# Patient Record
Sex: Female | Born: 1970 | Race: White | Hispanic: No | Marital: Married | Smoking: Never smoker
Health system: Southern US, Community
[De-identification: ages and names within clinical notes are randomized; demographics above are authoritative.]

## PROBLEM LIST (undated history)

## (undated) DIAGNOSIS — T753XXA Motion sickness, initial encounter: Secondary | ICD-10-CM

## (undated) DIAGNOSIS — M1990 Unspecified osteoarthritis, unspecified site: Secondary | ICD-10-CM

## (undated) DIAGNOSIS — R112 Nausea with vomiting, unspecified: Secondary | ICD-10-CM

## (undated) DIAGNOSIS — Z9889 Other specified postprocedural states: Secondary | ICD-10-CM

## (undated) DIAGNOSIS — I10 Essential (primary) hypertension: Secondary | ICD-10-CM

## (undated) DIAGNOSIS — Z8489 Family history of other specified conditions: Secondary | ICD-10-CM

## (undated) DIAGNOSIS — Z972 Presence of dental prosthetic device (complete) (partial): Secondary | ICD-10-CM

## (undated) DIAGNOSIS — R7303 Prediabetes: Secondary | ICD-10-CM

## (undated) DIAGNOSIS — D649 Anemia, unspecified: Secondary | ICD-10-CM

## (undated) DIAGNOSIS — J309 Allergic rhinitis, unspecified: Secondary | ICD-10-CM

## (undated) DIAGNOSIS — Z6841 Body Mass Index (BMI) 40.0 and over, adult: Secondary | ICD-10-CM

## (undated) DIAGNOSIS — E7800 Pure hypercholesterolemia, unspecified: Secondary | ICD-10-CM

## (undated) DIAGNOSIS — E669 Obesity, unspecified: Secondary | ICD-10-CM

## (undated) HISTORY — DX: Allergic rhinitis, unspecified: J30.9

## (undated) HISTORY — PX: JOINT REPLACEMENT: SHX530

## (undated) HISTORY — DX: Obesity, unspecified: E66.9

## (undated) HISTORY — DX: Pure hypercholesterolemia, unspecified: E78.00

## (undated) HISTORY — DX: Body Mass Index (BMI) 40.0 and over, adult: Z684

---

## 1997-10-17 HISTORY — PX: CHOLECYSTECTOMY: SHX55

## 2006-06-30 ENCOUNTER — Emergency Department: Payer: Self-pay | Admitting: Emergency Medicine

## 2006-10-30 ENCOUNTER — Ambulatory Visit: Payer: Self-pay | Admitting: Family Medicine

## 2009-08-19 ENCOUNTER — Ambulatory Visit: Payer: Self-pay | Admitting: Family Medicine

## 2010-08-31 ENCOUNTER — Ambulatory Visit: Payer: Self-pay | Admitting: Family Medicine

## 2011-09-14 ENCOUNTER — Ambulatory Visit: Payer: Self-pay | Admitting: Family Medicine

## 2012-10-11 ENCOUNTER — Ambulatory Visit: Payer: Self-pay | Admitting: Family Medicine

## 2012-12-10 ENCOUNTER — Ambulatory Visit: Payer: Self-pay | Admitting: General Practice

## 2012-12-10 LAB — CBC
HGB: 14 g/dL (ref 12.0–16.0)
MCH: 28.8 pg (ref 26.0–34.0)
MCV: 86 fL (ref 80–100)
RBC: 4.86 10*6/uL (ref 3.80–5.20)
RDW: 14.6 % — ABNORMAL HIGH (ref 11.5–14.5)
WBC: 10.1 10*3/uL (ref 3.6–11.0)

## 2012-12-10 LAB — BASIC METABOLIC PANEL
Calcium, Total: 8.8 mg/dL (ref 8.5–10.1)
Chloride: 105 mmol/L (ref 98–107)
Creatinine: 0.68 mg/dL (ref 0.60–1.30)
EGFR (Non-African Amer.): >60
Glucose: 86 mg/dL (ref 65–99)
Potassium: 3.9 mmol/L (ref 3.5–5.1)
Sodium: 139 mmol/L (ref 136–145)

## 2012-12-10 LAB — PROTIME-INR: INR: 0.9

## 2012-12-10 LAB — URINALYSIS, COMPLETE
Blood: NEGATIVE
Ketone: NEGATIVE
Nitrite: NEGATIVE
Ph: 7 (ref 4.5–8.0)
Protein: NEGATIVE
WBC UR: 4 /HPF (ref 0–5)

## 2012-12-10 LAB — APTT: Activated PTT: 28.4 secs (ref 23.6–35.9)

## 2012-12-10 LAB — MRSA PCR SCREENING

## 2013-01-02 ENCOUNTER — Inpatient Hospital Stay: Payer: Self-pay | Admitting: General Practice

## 2013-01-02 HISTORY — PX: REPLACEMENT TOTAL KNEE: SUR1224

## 2013-01-03 LAB — BASIC METABOLIC PANEL
Anion Gap: 11 (ref 7–16)
BUN: 6 mg/dL — ABNORMAL LOW (ref 7–18)
Co2: 22 mmol/L (ref 21–32)
Creatinine: 0.74 mg/dL (ref 0.60–1.30)
EGFR (Non-African Amer.): >60
Osmolality: 276 (ref 275–301)
Potassium: 4.2 mmol/L (ref 3.5–5.1)
Sodium: 138 mmol/L (ref 136–145)

## 2013-01-03 LAB — PLATELET COUNT: Platelet: 290 10*3/uL (ref 150–440)

## 2013-01-03 LAB — HEMOGLOBIN: HGB: 12.2 g/dL (ref 12.0–16.0)

## 2013-11-15 ENCOUNTER — Ambulatory Visit: Payer: Self-pay | Admitting: Family Medicine

## 2013-11-22 ENCOUNTER — Ambulatory Visit: Payer: Self-pay | Admitting: Family Medicine

## 2014-11-20 ENCOUNTER — Ambulatory Visit: Payer: Self-pay | Admitting: Family Medicine

## 2014-12-21 DIAGNOSIS — Z96659 Presence of unspecified artificial knee joint: Secondary | ICD-10-CM | POA: Insufficient documentation

## 2015-02-06 NOTE — Op Note (Signed)
PATIENT NAME:  Karen Morrison, SZCZESNY MR#:  [id number] DATE OF BIRTH:  1970-10-31  DATE OF PROCEDURE:  01/02/2013  PREOPERATIVE DIAGNOSES:  1.  Degenerative arthrosis of the left knee.  2.  Morbid obesity (body mass index of 46).   POSTOPERATIVE DIAGNOSES:  1.  Degenerative arthrosis of the left knee.  2.  Morbid obesity (body mass index of 46).   PROCEDURE PERFORMED: Left total knee arthroplasty using computer-assisted navigation.   SURGEON: Laurice Record. Hooten, MD   ASSISTANT:  Rachelle Hora, PA-C  ANESTHESIA: Femoral nerve block and spinal.   ESTIMATED BLOOD LOSS: 2000 mL of crystalloid.   TOURNIQUET TIME: 115 minutes.   DRAINS: Two medium drains to reinfusion system.   SOFT TISSUE RELEASES: Anterior cruciate ligament, posterior cruciate ligament, deep medial collateral ligament, and patellofemoral ligament.   IMPLANTS UTILIZED: DePuy PFC Sigma size 3 posterior stabilized femoral component (cemented), size 2.5 MBT tibial component (cemented), 32 mm 3-peg oval dome patella (cemented), and a 10 mm stabilized rotating platform polyethylene insert.   INDICATIONS FOR SURGERY: The patient is a 44 year old female who has been seen for complaints of progressive left knee pain. X-rays demonstrated significant degenerative changes with relative varus deformity. The patient had not seen any significant improvement despite conservative nonsurgical intervention. Of note, the patient's body mass index preoperatively was 46. She was unable to participate in a significant exercise program due to the progressive knee pain. After discussion of the risks and benefits of surgical intervention, the patient expressed understanding of the risks and benefits and agreed with plans for surgical intervention.   PROCEDURE IN DETAIL: The patient was brought into the operating room and, after adequate femoral nerve block and spinal anesthesia was achieved, a tourniquet was placed on the patient's upper left thigh. The  patient's left knee and leg were cleaned and prepped with alcohol and DuraPrep and draped in the usual sterile fashion. A "timeout" was performed as per usual protocol. The left lower extremity was exsanguinated using an Esmarch, and the tourniquet was inflated to 300 mmHg. An anterior longitudinal incision was made followed by a standard mid vastus approach. A moderate effusion was evacuated. Deep fibers of the medial collateral ligament were elevated in a subperiosteal fashion off the medial flare of the tibia so as to maintain a continuous soft tissue sleeve. The patella was subluxed laterally and the patellofemoral ligament was incised. There was some difficulty with mobilization and positioning of the patella due to the soft tissue envelope.  After adequate mobilization, the knee was inspected and demonstrated significant degenerative changes, in both medial and lateral compartments, with lesser changes noted to the patellofemoral articulation. Prominent osteophytes were debrided using a rongeur. Anterior and posterior cruciate ligaments were excised. Two 4.0 mm Schanz pins were inserted into the femur and into the tibia for attachment of the ray of trackers used for computer-assisted navigation. Hip center was identified using a circumduction technique. Distal landmarks were mapped using the computer. The distal femur and proximal tibia were mapped using the computer. Distal femoral cutting guide was positioned using computer-assisted navigation so as to achieve 5 degree distal valgus cut. Cut was performed and verified using the computer. The distal femur was sized and it was felt that a size 3 femoral component was appropriate. Size 3 cutting guide was positioned and the anterior cut was performed and verified using the computer. This was followed by completion of the posterior and chamfer cuts. Femoral cutting guide for the central box was then positioned and  the central box cut was performed. Attention was  then directed to the proximal tibia. Medial and lateral menisci were excised. The extramedullary tibial cutting guide was positioned using computer-assisted navigation so as to achieve 0 degree varus valgus alignment and 0 degree posterior slope. The cut was performed and verified using the computer. The proximal tibia was sized and it was felt that a size 2.5 tibial tray was appropriate. Tibial and femoral trials were inserted followed by insertion of a 10 mm polyethylene insert. Good medial and lateral soft tissue balancing was appreciated, both in extension and in flexion. Finally, the patella was cut and prepared so as to accommodate a 32 mm 3-peg oval dome patella. Patellar trial was placed and the knee was placed through range of motion with good patellar tracking appreciated. The femoral trial was then removed after inspection of any posterior osteophytes. Central post hole for the tibial component was reamed followed by insertion of a keel punch. Tibial trial was then removed. The cut surfaces of bone were irrigated with copious amounts of normal saline with antibiotic solution using pulsatile lavage and then suctioned dry. Polymethyl methacrylate cement was prepared in the usual fashion using a vacuum mixer. Cement was applied to the cut surface of the proximal tibia as well as along the undersurface of a size 2.5 MBT tibial component. The tibial component was positioned and impacted into place. Excess cement was removed using freer elevators. Cement was applied to the cut surface of the femur as well as along the posterior flanges of a size 3 posterior stabilized femoral component. Femoral component was positioned and impacted into place. Excess cement was removed using freer elevators. A 10 mm polyethylene trial was inserted and the knee was brought into full extension with steady axial compression applied. Finally, cement was applied to the backside of a 32 mm 3-peg oval dome patella and the patellar  component was positioned and patellar clamp applied. Excess cement was removed using freer elevators.   After adequate curing of cement, the tourniquet was deflated after a total tourniquet time of 115 minutes. It should be noted that the cement utilized was premixed with gentamicin. Hemostasis was achieved using electrocautery. The knee was irrigated with copious amounts of normal saline with antibiotic solution using pulsatile lavage and then suctioned dry. The knee was inspected for any residual cement debris. 30 mL of 0.25% Marcaine with epinephrine was injected along the posterior capsule. A 10 mm stabilized rotating platform polyethylene insert was inserted and the knee was placed through a range of motion. Good patellar tracking was appreciated and good mediolateral soft tissue balancing was noted, both in extension and in flexion. Two medium drains were placed in the wound bed and brought out through a separate stab incision to be attached to the reinfusion system. The medial parapatellar portion of the incision was reapproximated using interrupted sutures of #1 Vicryl. The subcutaneous tissue was approximated in layers using first #0 Vicryl followed by #2-0 Vicryl. Skin was closed with skin staples. A sterile dressing was applied.   The patient tolerated the procedure well. She was transported to the recovery room in stable condition.  ____________________________ Laurice Record. Holley Bouche., MD jph:sb D: 01/03/2013 06:36:01 ET T: 01/03/2013 07:18:11 ET JOB#: [id number]  cc: Jeneen Rinks P. Holley Bouche., MD, <Dictator> Laurice Record Holley Bouche MD ELECTRONICALLY SIGNED 01/04/2013 17:58

## 2015-02-06 NOTE — Discharge Summary (Signed)
PATIENT NAME:  Karen Morrison, Karen Morrison MR#:  [id number] DATE OF BIRTH:  [birth year]  DATE OF ADMISSION:  01/02/2013 DATE OF DISCHARGE:  01/05/2013  ADMITTING DIAGNOSES: Left knee degenerative arthrosis, morbid obesity.   DISCHARGE DIAGNOSES: Degenerative arthrosis of left knee and morbid obesity.   PROCEDURE PERFORMED: Left total knee arthroplasty using computer-assisted navigation.   SURGEON: Laurice Record. Hooten, M.D.   ASSISTANT: Rachelle Hora, PA-C.   ANESTHESIA: Femoral nerve block.   ESTIMATED BLOOD LOSS: 2000 mL of crystalloid.   HISTORY: The patient is a 44 year old who presents today for history and physical. She is to undergo a left knee arthroplasty on 01/02/2013. She has had a several year history of progressive left knee pain. Pain is aggravated by weight-bearing activities. She is currently not using any ambulatory aids. She has not seen any significant improvement despite use of etodolac, corticosteroid injections or Synvisc injections. She localizes most the pain along the medial aspect of the knee. She has had significant difficulty with ascending and descending stairs. The left knee pain has progressed to the point that it is significantly interfering with her activities of daily living.   PHYSICAL EXAMINATION:  GENERAL: Well developed, well nourished, obese female in no acute distress. Normal affect. Normal gait, although with mild varus thrust of the right knee.  HEENT: Atraumatic, normocephalic. Pupils are equal, round and reactive to light. Extraocular motions intact. Sclerae clear. Oropharynx is clear with mucosa.  NECK: Supple, nontender ,with good range of motion. No thyromegaly, adenopathy, JVD or carotid bruits.  LUNGS: Clear to auscultation bilaterally.  CARDIOVASCULAR: Regular rate and rhythm. Normal S1, S2. No murmur. No appreciable gallops or rubs. Peripheral pulses are palpable.  ABDOMEN: Soft, nontender, nondistended. Bowel sounds are present.  EXTREMITIES: No lower  extremity edema. Homans test is negative. Good strength, stability and range of motion of the upper extremities. Good range of motion of the hips and ankles. Left knee: The patient's left knee has noted swelling, effusion, erythema. She has crepitus with range of motion. She is tender over the medial joint line and has a varus alignment. She has a negative anterior and Lachman's test. She has a negative McMurray's test. There is good muscle tone on the quadriceps. She has range of motion of 0 to 108 degrees. Peripheral pulses are palpable and she is neurovascularly intact with the left lower extremity.   HOSPITAL COURSE: The patient was admitted to the hospital on 01/02/2013. She had surgery that same day and was brought to the orthopedic floor from the PACU in stable condition. On postoperative day 1, the patient's vitals and labs remained stable. She had excellent progress with physical therapy and on postoperative day 2, the patient continued to progress with physical therapy and was doing well. The patient had bowel movements and was ready for discharge by postoperative day 3 on 01/05/2013. The patient was tolerating p.o. Pain was controlled. She was able to ambulate 350 feet with physical therapy. The patient was ready for discharge home with home PT.   CONDITION AT DISCHARGE: Stable.   DISCHARGE INSTRUCTIONS: She may gradually increase weight-bearing on the affected extremity. She is to elevate the affected foot or leg on 1 or 2 pillows with the foot higher than the knee. Thigh-high TED hose on both legs and remove 1 hour per 8- hour shift and use incentive spirometry every 1 hour while awake and encourage cough and deep breathing. She may resume a regular diet as tolerated. She is to use [facility] unit and  maintain a temperature between 40 and 50 degrees. Do not get the dressing or bandage wet or dirty. Call [place] office if the dressing gets water under it. Leave the dressing on.  Call [facility] if any of the following occur: Bright red-red bleeding from the incision or wound, fever above 101.5 degrees, redness, swelling or drainage at the incision. Call [facility] if you experience any increased leg pain, numbness or weakness in legs, bowel or bladder symptoms. She was referred to home physical therapy. Arranged for continuation of rehab program. She is to call [facility] if a therapist has not contacted her within 48 hours of return home. She is to follow up with [facility] in 2 weeks for staple removal.   DISCHARGE MEDICATIONS: Chondroitin/glucosamine 1 tablet orally 2 times a day, Levonest 28 triphasic oral tablet 1 tablet orally once a day, Tylenol 500 mg oral tablet 1 tablet orally every 4 hours as needed for pain or temperature greater than 100.4, tramadol 50 mg oral tablet 1 tablet orally every 4 hours as needed for pain, Celebrex 200 mg oral capsule 1 cap orally 2 times a day, Nucynta 50 mg oral tablet 1 tablet orally every 4 hours as needed for pain, magnesium hydroxide 8% oral suspension 30 mL orally 2 times a day as needed for constipation, aluminum hydroxide and magnesium 30 orally every 6 hours as needed for indigestion or heartburn, Lovenox 40 mg subcutaneous once a day for 14 days, bisacodyl 10 mg rectal suppository 1 suppository rectally once a day as needed for constipation, docusate/senna 50/8.6 mg oral tablet 1 tablet orally 2 times a day, pantoprazole 40 mg oral delayed-release capsule 1 tablet orally 2 times a day.    ____________________________ Duanne Guess, PA-C tcg:jm D: 01/05/2013 07:54:02 ET T: 01/05/2013 14:03:22 ET JOB#: [id number]  cc: Duanne Guess, PA-C, <Dictator> Duanne Guess PA ELECTRONICALLY SIGNED 01/16/2013 12:32

## 2015-10-07 ENCOUNTER — Other Ambulatory Visit: Payer: Self-pay | Admitting: Family Medicine

## 2015-10-07 DIAGNOSIS — Z1231 Encounter for screening mammogram for malignant neoplasm of breast: Secondary | ICD-10-CM

## 2015-11-12 ENCOUNTER — Ambulatory Visit (INDEPENDENT_AMBULATORY_CARE_PROVIDER_SITE_OTHER): Payer: 59 | Admitting: Family Medicine

## 2015-11-12 ENCOUNTER — Encounter: Payer: Self-pay | Admitting: Family Medicine

## 2015-11-12 VITALS — BP 136/80 | HR 118 | Temp 98.4°F | Resp 17 | Wt 273.2 lb

## 2015-11-12 DIAGNOSIS — M779 Enthesopathy, unspecified: Secondary | ICD-10-CM | POA: Insufficient documentation

## 2015-11-12 DIAGNOSIS — E668 Other obesity: Secondary | ICD-10-CM | POA: Insufficient documentation

## 2015-11-12 DIAGNOSIS — R509 Fever, unspecified: Secondary | ICD-10-CM | POA: Diagnosis not present

## 2015-11-12 DIAGNOSIS — J309 Allergic rhinitis, unspecified: Secondary | ICD-10-CM | POA: Insufficient documentation

## 2015-11-12 DIAGNOSIS — J029 Acute pharyngitis, unspecified: Secondary | ICD-10-CM | POA: Diagnosis not present

## 2015-11-12 DIAGNOSIS — K8020 Calculus of gallbladder without cholecystitis without obstruction: Secondary | ICD-10-CM | POA: Insufficient documentation

## 2015-11-12 DIAGNOSIS — E7800 Pure hypercholesterolemia, unspecified: Secondary | ICD-10-CM | POA: Insufficient documentation

## 2015-11-12 LAB — POC INFLUENZA A&B (BINAX/QUICKVUE)
INFLUENZA A, POC: NEGATIVE
INFLUENZA B, POC: NEGATIVE

## 2015-11-12 LAB — POCT RAPID STREP A (OFFICE): RAPID STREP A SCREEN: NEGATIVE

## 2015-11-12 NOTE — Patient Instructions (Signed)
For your throat salt water gargles and ibuprofen. Expect sinus congestion and cough: Mucinex D, Benadryl or Nyquil at night for postnasal drainage, and Delsym for cough

## 2015-11-12 NOTE — Progress Notes (Signed)
Subjective:     Patient ID: Karen Morrison, female   DOB: Mar 23, 1971, 45 y.o.   MRN: [id number]  HPI  Chief Complaint  Patient presents with  . Sore Throat    Patient comes in office today with complaints of sore throat for the past 24hrs. Patient states that it feels like razor blades in her throat and she has noticed white bumps on both side of her tonsils, two coworkers of hers have already been diagnosed with strep. Patient had low grade fever last night and took otc Tylenol .   Reports fever to 101, body aches and chills. + flu shot.   Review of Systems  Respiratory: Negative for cough.        Objective:   Physical Exam  Constitutional: She appears well-developed and well-nourished. No distress.  Ears: T.M's intact without inflammation Throat: no tonsillar enlargement or exudate, adenoids prominent and appear mildly erythematous Neck: left anterior cervical node Lungs: clear     Assessment:    1. Pharyngitis: early viral prodrome - POCT rapid strep A  2. Fever, unspecified fever cause - POC Influenza A&B    Plan:    Discussed sx treatment.

## 2015-11-23 ENCOUNTER — Ambulatory Visit
Admission: RE | Admit: 2015-11-23 | Discharge: 2015-11-23 | Disposition: A | Discharge status: Home / Self Care | Payer: 59 | Source: Ambulatory Visit | Attending: Family Medicine | Admitting: Family Medicine

## 2015-11-23 DIAGNOSIS — Z1231 Encounter for screening mammogram for malignant neoplasm of breast: Secondary | ICD-10-CM | POA: Diagnosis present

## 2015-12-29 ENCOUNTER — Other Ambulatory Visit: Payer: Self-pay | Admitting: Family Medicine

## 2015-12-29 DIAGNOSIS — Z308 Encounter for other contraceptive management: Secondary | ICD-10-CM

## 2015-12-29 DIAGNOSIS — Z309 Encounter for contraceptive management, unspecified: Secondary | ICD-10-CM | POA: Insufficient documentation

## 2016-01-29 ENCOUNTER — Ambulatory Visit (INDEPENDENT_AMBULATORY_CARE_PROVIDER_SITE_OTHER): Payer: 59 | Admitting: Family Medicine

## 2016-01-29 ENCOUNTER — Encounter: Payer: Self-pay | Admitting: Family Medicine

## 2016-01-29 VITALS — BP 134/88 | HR 92 | Temp 98.4°F | Resp 16 | Ht 65.0 in | Wt 273.0 lb

## 2016-01-29 DIAGNOSIS — E7800 Pure hypercholesterolemia, unspecified: Secondary | ICD-10-CM | POA: Diagnosis not present

## 2016-01-29 DIAGNOSIS — Z308 Encounter for other contraceptive management: Secondary | ICD-10-CM

## 2016-01-29 DIAGNOSIS — Z126 Encounter for screening for malignant neoplasm of bladder: Secondary | ICD-10-CM | POA: Diagnosis not present

## 2016-01-29 DIAGNOSIS — Z1211 Encounter for screening for malignant neoplasm of colon: Secondary | ICD-10-CM | POA: Diagnosis not present

## 2016-01-29 DIAGNOSIS — Z0000 Encounter for general adult medical examination without abnormal findings: Secondary | ICD-10-CM

## 2016-01-29 DIAGNOSIS — Z6841 Body Mass Index (BMI) 40.0 and over, adult: Secondary | ICD-10-CM

## 2016-01-29 DIAGNOSIS — Z124 Encounter for screening for malignant neoplasm of cervix: Secondary | ICD-10-CM | POA: Diagnosis not present

## 2016-01-29 DIAGNOSIS — Z96652 Presence of left artificial knee joint: Secondary | ICD-10-CM | POA: Diagnosis not present

## 2016-01-29 LAB — POCT URINALYSIS DIPSTICK
Blood, UA: NEGATIVE
GLUCOSE UA: NEGATIVE
Ketones, UA: NEGATIVE
NITRITE UA: NEGATIVE
Protein, UA: NEGATIVE
SPEC GRAV UA: 1.02
UROBILINOGEN UA: 0.2
pH, UA: 6.5

## 2016-01-29 LAB — IFOBT (OCCULT BLOOD): IFOBT: NEGATIVE

## 2016-01-29 MED ORDER — ETODOLAC 500 MG PO TABS: 500.0000 mg | ORAL_TABLET | Freq: Two times a day (BID) | ORAL | Status: DC

## 2016-01-29 MED ORDER — LEVONORG-ETH ESTRAD TRIPHASIC PO TABS: ORAL_TABLET | ORAL | Status: DC

## 2016-01-29 NOTE — Progress Notes (Signed)
Patient: Karen Morrison, Female    DOB: 02/11/1971, 45 y.o.   MRN: [id number] Visit Date: 01/29/2016  Today's Provider: Margarita Rana, MD   Chief Complaint  Patient presents with  . Annual Exam   Subjective:    Annual physical exam Karen Morrison is a 45 y.o. female who presents today for health maintenance and complete physical. She feels well. She reports exercising 5 days a week. Walks for 30 minutes. She reports she is sleeping well.  Last Pap- 09/05/2012- WNL. HPV neg. Last Mammo-11/23/2015- BI-RADS 1 Tdap- 01/23/2015  -----------------------------------------------------------------   Review of Systems  Constitutional: Positive for diaphoresis (hot flashes).  All other systems reviewed and are negative.   Social History      She  reports that she has never smoked. She has never used smokeless tobacco. She reports that she does not drink alcohol or use illicit drugs.       Social History   Social History  . Marital Status: Married    Spouse Name: Edd Arbour  . Number of Children: 2  . Years of Education: HS   Social History Main Topics  . Smoking status: Never Smoker   . Smokeless tobacco: Never Used  . Alcohol Use: No  . Drug Use: No  . Sexual Activity: Not Asked   Other Topics Concern  . None   Social History Narrative    Past Medical History  Diagnosis Date  . Hypercholesteremia   . Obesity   . Allergic rhinitis      Patient Active Problem List   Diagnosis Date Noted  . Encounter for birth control 12/29/2015  . Allergic rhinitis 11/12/2015  . Calculus of gallbladder 11/12/2015  . Hypercholesteremia 11/12/2015  . Bone spur 11/12/2015  . Extreme obesity ([place]) 11/12/2015  . H/O total knee replacement 12/21/2014    Past Surgical History  Procedure Laterality Date  . Replacement total knee  01/02/2013  . Cesarean section  1994  . Cholecystectomy  1999    Family History        Family Status  Relation Status Death Age  . Mother  Alive   . Paternal Advertising account executive   . Maternal Grandmother Alive   . Maternal Grandfather Deceased   . Paternal Grandfather Deceased   . Paternal Grandmother Deceased   . Father Alive         Her family history includes Alzheimer's disease in her maternal grandmother; Breast cancer in her paternal aunt; Cancer in her paternal aunt; Diabetes in her mother; Emphysema in her paternal grandfather; Healthy in her father; Heart attack in her maternal grandfather.    Allergies  Allergen Reactions  . Azithromycin   . Hydrocodone-Acetaminophen   . Penicillins   . Amoxicillin Rash and Swelling    Previous Medications   ETODOLAC (LODINE) 500 MG TABLET    Take by mouth.   MULTIPLE VITAMIN (MULTI-VITAMINS) TABS    Take by mouth.   MYZILRA TABLET    Take 1 tablet by mouth  daily    Patient Care Team: Margarita Rana, MD as PCP - General (Family Medicine)     Objective:   Vitals: BP 134/88 mmHg  Pulse 92  Temp(Src) 98.4 F (36.9 C) (Oral)  Resp 16  Ht 5\' 5"  (1.651 m)  Wt 273 lb (123.832 kg)  BMI 45.43 kg/m2  LMP 01/21/2016   Physical Exam  Constitutional: She is oriented to person, place, and time. She appears well-developed and well-nourished.  HENT:  Head: Normocephalic and atraumatic.  Right Ear: Tympanic membrane, external ear and ear canal normal.  Left Ear: Tympanic membrane, external ear and ear canal normal.  Nose: Nose normal.  Mouth/Throat: Uvula is midline, oropharynx is clear and moist and mucous membranes are normal.  Eyes: Conjunctivae, EOM and lids are normal. Pupils are equal, round, and reactive to light.  Neck: Trachea normal and normal range of motion. Neck supple. Carotid bruit is not present. No thyroid mass and no thyromegaly present.  Cardiovascular: Normal rate, regular rhythm and normal heart sounds.   Pulmonary/Chest: Effort normal and breath sounds normal.  Abdominal: Soft. Normal appearance and bowel sounds are normal. There is no hepatosplenomegaly. There  is no tenderness.  Genitourinary: Vagina normal. No breast swelling, tenderness or discharge.  Musculoskeletal: Normal range of motion.  Lymphadenopathy:    She has no cervical adenopathy.    She has no axillary adenopathy.  Neurological: She is alert and oriented to person, place, and time. She has normal strength. No cranial nerve deficit.  Skin: Skin is warm, dry and intact.  Psychiatric: She has a normal mood and affect. Her speech is normal and behavior is normal. Judgment and thought content normal. Cognition and memory are normal.     Depression Screen PHQ 2/9 Scores 01/29/2016  PHQ - 2 Score 0      Assessment & Plan:     Routine Health Maintenance and Physical Exam  Exercise Activities and Dietary recommendations Goals    None      Immunization History  Administered Date(s) Administered  . Td 07/19/2005  . Tdap 01/23/2015    Health Maintenance  Topic Date Due  . HIV Screening  04/30/1984  . PAP SMEAR  04/30/1990  . INFLUENZA VACCINE  05/17/2016  . TETANUS/TDAP  01/22/2025      Discussed health benefits of physical activity, and encouraged her to engage in regular exercise appropriate for her age and condition.    --------------------------------------------------------------------  1. Annual physical exam Stable. As above.  2. Hypercholesteremia Will check labs. FU pending results. - Lipid panel - TSH - CBC with Differential/Platelet - Comprehensive metabolic panel  3. Morbid obesity, unspecified obesity type ([place]) Work on diet and exercise.  4. Bladder cancer screening UA negative for blood. Small leuks. Pt is asymptomatic. Will not treat at this time. - POCT urinalysis dipstick Results for orders placed or performed in visit on 01/29/16  POCT urinalysis dipstick  Result Value Ref Range   Color, UA yellow    Clarity, UA clear    Glucose, UA negative    Bilirubin, UA N/A due to etodolac use    Ketones, UA negative    Spec Grav, UA 1.020     Blood, UA negative    pH, UA 6.5    Protein, UA negative    Urobilinogen, UA 0.2    Nitrite, UA negative    Leukocytes, UA small (1+) (A) Negative  IFOBT POC (occult bld, rslt in office)  Result Value Ref Range   IFOBT Negative      5. Screening for cervical cancer Pap obtained. FU pending results. - Pap IG and HPV (high risk) DNA detection  6. H/O total knee replacement, left Stable. Refill meds as below. - etodolac (LODINE) 500 MG tablet; Take 1 tablet (500 mg total) by mouth 2 (two) times daily.  Dispense: 180 tablet; Refill: 3  7. Screening for colon cancer OC Lite Negative. - IFOBT POC (occult bld, rslt in office)  8. BMI 45.0-49.9,  adult [facility]) Work on diet and exercise.  9. Encounter for other contraceptive management Stable. Refill as below. - levonorgestrel-ethinyl estradiol (MYZILRA) tablet; Take 1 tablet by mouth  daily  Dispense: 84 tablet; Refill: 12    Patient seen and examined by Jerrell Belfast, MD, and note scribed by Renaldo Fiddler, CMA.  I have reviewed the document for accuracy and completeness and I agree with above. Jerrell Belfast, MD   Margarita Rana, MD

## 2016-01-30 LAB — CBC WITH DIFFERENTIAL/PLATELET
BASOS: 0 %
Basophils Absolute: 0 10*3/uL (ref 0.0–0.2)
EOS (ABSOLUTE): 0.1 10*3/uL (ref 0.0–0.4)
EOS: 1 %
HEMATOCRIT: 41.5 % (ref 34.0–46.6)
Hemoglobin: 14.1 g/dL (ref 11.1–15.9)
Immature Grans (Abs): 0 10*3/uL (ref 0.0–0.1)
Immature Granulocytes: 0 %
LYMPHS ABS: 2.5 10*3/uL (ref 0.7–3.1)
Lymphs: 26 %
MCH: 28.8 pg (ref 26.6–33.0)
MCHC: 34 g/dL (ref 31.5–35.7)
MCV: 85 fL (ref 79–97)
MONOS ABS: 0.8 10*3/uL (ref 0.1–0.9)
Monocytes: 8 %
Neutrophils Absolute: 6.3 10*3/uL (ref 1.4–7.0)
Neutrophils: 65 %
Platelets: 333 10*3/uL (ref 150–379)
RBC: 4.9 x10E6/uL (ref 3.77–5.28)
RDW: 14.3 % (ref 12.3–15.4)
WBC: 9.7 10*3/uL (ref 3.4–10.8)

## 2016-01-30 LAB — COMPREHENSIVE METABOLIC PANEL
A/G RATIO: 1.7 (ref 1.2–2.2)
ALK PHOS: 89 IU/L (ref 39–117)
ALT: 16 IU/L (ref 0–32)
AST: 17 IU/L (ref 0–40)
Albumin: 4 g/dL (ref 3.5–5.5)
BILIRUBIN TOTAL: 0.4 mg/dL (ref 0.0–1.2)
BUN/Creatinine Ratio: 14 (ref 9–23)
BUN: 10 mg/dL (ref 6–24)
CO2: 24 mmol/L (ref 18–29)
Calcium: 9.2 mg/dL (ref 8.7–10.2)
Chloride: 99 mmol/L (ref 96–106)
Creatinine, Ser: 0.74 mg/dL (ref 0.57–1.00)
GFR calc Af Amer: 112 mL/min/{1.73_m2} (ref >59)
GFR calc non Af Amer: 97 mL/min/{1.73_m2} (ref >59)
GLOBULIN, TOTAL: 2.4 g/dL (ref 1.5–4.5)
Glucose: 83 mg/dL (ref 65–99)
POTASSIUM: 4.7 mmol/L (ref 3.5–5.2)
SODIUM: 140 mmol/L (ref 134–144)
Total Protein: 6.4 g/dL (ref 6.0–8.5)

## 2016-01-30 LAB — LIPID PANEL
CHOL/HDL RATIO: 3.9 ratio (ref 0.0–4.4)
Cholesterol, Total: 214 mg/dL — ABNORMAL HIGH (ref 100–199)
HDL: 55 mg/dL (ref >39)
LDL CALC: 131 mg/dL — AB (ref 0–99)
TRIGLYCERIDES: 140 mg/dL (ref 0–149)
VLDL Cholesterol Cal: 28 mg/dL (ref 5–40)

## 2016-01-30 LAB — TSH: TSH: 2.56 u[IU]/mL (ref 0.450–4.500)

## 2016-02-01 ENCOUNTER — Telehealth: Payer: Self-pay

## 2016-02-01 NOTE — Telephone Encounter (Signed)
-----   Message from Margarita Rana, MD sent at 01/30/2016  3:22 PM EDT ----- Labs stable. Cholesterol is elevated, but balanced by high good cholesterol. Work on [employer] and exercise and recheck annually. Thanks.

## 2016-02-01 NOTE — Telephone Encounter (Signed)
Pt advised.   Thanks,   -Lorella Gomez  

## 2016-02-03 ENCOUNTER — Telehealth: Payer: Self-pay

## 2016-02-03 LAB — PAP IG AND HPV HIGH-RISK
HPV, HIGH-RISK: NEGATIVE
PAP Smear Comment: 0

## 2016-02-03 NOTE — Telephone Encounter (Signed)
Advised pt of lab results. Pt verbally acknowledges understanding. Emily Drozdowski, CMA   

## 2016-02-03 NOTE — Telephone Encounter (Signed)
-----   Message from Margarita Rana, MD sent at 02/03/2016  2:08 PM EDT ----- Pap is normal. Please notify patient.

## 2016-10-18 ENCOUNTER — Other Ambulatory Visit: Payer: Self-pay | Admitting: Family Medicine

## 2016-10-18 DIAGNOSIS — Z1231 Encounter for screening mammogram for malignant neoplasm of breast: Secondary | ICD-10-CM

## 2016-11-23 ENCOUNTER — Ambulatory Visit
Admission: RE | Admit: 2016-11-23 | Discharge: 2016-11-23 | Disposition: A | Discharge status: Home / Self Care | Payer: 59 | Source: Ambulatory Visit | Attending: Family Medicine | Admitting: Family Medicine

## 2016-11-23 DIAGNOSIS — Z1231 Encounter for screening mammogram for malignant neoplasm of breast: Secondary | ICD-10-CM | POA: Diagnosis not present

## 2017-01-30 ENCOUNTER — Encounter: Payer: Self-pay | Admitting: Physician Assistant

## 2017-01-30 ENCOUNTER — Ambulatory Visit (INDEPENDENT_AMBULATORY_CARE_PROVIDER_SITE_OTHER): Payer: 59 | Admitting: Physician Assistant

## 2017-01-30 VITALS — BP 136/90 | HR 90 | Temp 98.4°F | Resp 20 | Ht 65.0 in | Wt 283.0 lb

## 2017-01-30 DIAGNOSIS — E7800 Pure hypercholesterolemia, unspecified: Secondary | ICD-10-CM

## 2017-01-30 DIAGNOSIS — Z114 Encounter for screening for human immunodeficiency virus [HIV]: Secondary | ICD-10-CM

## 2017-01-30 DIAGNOSIS — Z6841 Body Mass Index (BMI) 40.0 and over, adult: Secondary | ICD-10-CM

## 2017-01-30 DIAGNOSIS — Z96652 Presence of left artificial knee joint: Secondary | ICD-10-CM

## 2017-01-30 DIAGNOSIS — Z308 Encounter for other contraceptive management: Secondary | ICD-10-CM

## 2017-01-30 DIAGNOSIS — Z0000 Encounter for general adult medical examination without abnormal findings: Secondary | ICD-10-CM | POA: Diagnosis not present

## 2017-01-30 HISTORY — DX: Body Mass Index (BMI) 40.0 and over, adult: Z684

## 2017-01-30 MED ORDER — LEVONORG-ETH ESTRAD TRIPHASIC PO TABS: ORAL_TABLET | ORAL | 12 refills | Status: DC

## 2017-01-30 MED ORDER — ETODOLAC 500 MG PO TABS: 500.0000 mg | ORAL_TABLET | Freq: Two times a day (BID) | ORAL | 3 refills | Status: DC

## 2017-01-30 NOTE — Patient Instructions (Signed)
Health Maintenance, Female Adopting a healthy lifestyle and getting preventive care can go a long way to promote health and wellness. Talk with your health care provider about what schedule of regular examinations is right for you. This is a good chance for you to check in with your provider about disease prevention and staying healthy. In between checkups, there are plenty of things you can do on your own. Experts have done a lot of research about which lifestyle changes and preventive measures are most likely to keep you healthy. Ask your health care provider for more information. Weight and diet Eat a healthy diet  Be sure to include plenty of vegetables, fruits, low-fat dairy products, and lean protein.  Do not eat a lot of foods high in solid fats, added sugars, or salt.  Get regular exercise. This is one of the most important things you can do for your health.  Most adults should exercise for at least 150 minutes each week. The exercise should increase your heart rate and make you sweat (moderate-intensity exercise).  Most adults should also do strengthening exercises at least twice a week. This is in addition to the moderate-intensity exercise. Maintain a healthy weight  Body mass index (BMI) is a measurement that can be used to identify possible weight problems. It estimates body fat based on height and weight. Your health care provider can help determine your BMI and help you achieve or maintain a healthy weight.  For females 76 years of age and older:  A BMI below 18.5 is considered underweight.  A BMI of 18.5 to 24.9 is normal.  A BMI of 25 to 29.9 is considered overweight.  A BMI of 30 and above is considered obese. Watch levels of cholesterol and blood lipids  You should start having your blood tested for lipids and cholesterol at 48 years of age, then have this test every 5 years.  You may need to have your cholesterol levels checked more often if:  Your lipid or  cholesterol levels are high.  You are older than 48 years of age.  You are at high risk for heart disease. Cancer screening Lung Cancer  Lung cancer screening is recommended for adults 64-42 years old who are at high risk for lung cancer because of a history of smoking.  A yearly low-dose CT scan of the lungs is recommended for people who:  Currently smoke.  Have quit within the past 15 years.  Have at least a 30-pack-year history of smoking. A pack year is smoking an average of one pack of cigarettes a day for 1 year.  Yearly screening should continue until it has been 15 years since you quit.  Yearly screening should stop if you develop a health problem that would prevent you from having lung cancer treatment. Breast Cancer  Practice breast self-awareness. This means understanding how your breasts normally appear and feel.  It also means doing regular breast self-exams. Let your health care provider know about any changes, no matter how small.  If you are in your 20s or 30s, you should have a clinical breast exam (CBE) by a health care provider every 1-3 years as part of a regular health exam.  If you are 34 or older, have a CBE every year. Also consider having a breast X-ray (mammogram) every year.  If you have a family history of breast cancer, talk to your health care provider about genetic screening.  If you are at high risk for breast cancer, talk  to your health care provider about having an MRI and a mammogram every year.  Breast cancer gene (BRCA) assessment is recommended for women who have family members with BRCA-related cancers. BRCA-related cancers include:  Breast.  Ovarian.  Tubal.  Peritoneal cancers.  Results of the assessment will determine the need for genetic counseling and BRCA1 and BRCA2 testing. Cervical Cancer  Your health care provider may recommend that you be screened regularly for cancer of the pelvic organs (ovaries, uterus, and vagina).  This screening involves a pelvic examination, including checking for microscopic changes to the surface of your cervix (Pap test). You may be encouraged to have this screening done every 3 years, beginning at age 24.  For women ages 66-65, health care providers may recommend pelvic exams and Pap testing every 3 years, or they may recommend the Pap and pelvic exam, combined with testing for human papilloma virus (HPV), every 5 years. Some types of HPV increase your risk of cervical cancer. Testing for HPV may also be done on women of any age with unclear Pap test results.  Other health care providers may not recommend any screening for nonpregnant women who are considered low risk for pelvic cancer and who do not have symptoms. Ask your health care provider if a screening pelvic exam is right for you.  If you have had past treatment for cervical cancer or a condition that could lead to cancer, you need Pap tests and screening for cancer for at least 20 years after your treatment. If Pap tests have been discontinued, your risk factors (such as having a new sexual partner) need to be reassessed to determine if screening should resume. Some women have medical problems that increase the chance of getting cervical cancer. In these cases, your health care provider may recommend more frequent screening and Pap tests. Colorectal Cancer  This type of cancer can be detected and often prevented.  Routine colorectal cancer screening usually begins at 48 years of age and continues through 48 years of age.  Your health care provider may recommend screening at an earlier age if you have risk factors for colon cancer.  Your health care provider may also recommend using home test kits to check for hidden blood in the stool.  A small camera at the end of a tube can be used to examine your colon directly (sigmoidoscopy or colonoscopy). This is done to check for the earliest forms of colorectal cancer.  Routine  screening usually begins at age 41.  Direct examination of the colon should be repeated every 5-10 years through 48 years of age. However, you may need to be screened more often if early forms of precancerous polyps or small growths are found. Skin Cancer  Check your skin from head to toe regularly.  Tell your health care provider about any new moles or changes in moles, especially if there is a change in a mole's shape or color.  Also tell your health care provider if you have a mole that is larger than the size of a pencil eraser.  Always use sunscreen. Apply sunscreen liberally and repeatedly throughout the day.  Protect yourself by wearing long sleeves, pants, a wide-brimmed hat, and sunglasses whenever you are outside. Heart disease, diabetes, and high blood pressure  High blood pressure causes heart disease and increases the risk of stroke. High blood pressure is more likely to develop in:  People who have blood pressure in the high end of the normal range (130-139/85-89 mm Hg).  People who are overweight or obese.  People who are African American.  If you are 59-24 years of age, have your blood pressure checked every 3-5 years. If you are 34 years of age or older, have your blood pressure checked every year. You should have your blood pressure measured twice-once when you are at a hospital or clinic, and once when you are not at a hospital or clinic. Record the average of the two measurements. To check your blood pressure when you are not at a hospital or clinic, you can use:  An automated blood pressure machine at a pharmacy.  A home blood pressure monitor.  If you are between 29 years and 60 years old, ask your health care provider if you should take aspirin to prevent strokes.  Have regular diabetes screenings. This involves taking a blood sample to check your fasting blood sugar level.  If you are at a normal weight and have a low risk for diabetes, have this test once  every three years after 48 years of age.  If you are overweight and have a high risk for diabetes, consider being tested at a younger age or more often. Preventing infection Hepatitis B  If you have a higher risk for hepatitis B, you should be screened for this virus. You are considered at high risk for hepatitis B if:  You were born in a country where hepatitis B is common. Ask your health care provider which countries are considered high risk.  Your parents were born in a high-risk country, and you have not been immunized against hepatitis B (hepatitis B vaccine).  You have HIV or AIDS.  You use needles to inject street drugs.  You live with someone who has hepatitis B.  You have had sex with someone who has hepatitis B.  You get hemodialysis treatment.  You take certain medicines for conditions, including cancer, organ transplantation, and autoimmune conditions. Hepatitis C  Blood testing is recommended for:  Everyone born from [birth year] through 1967.  Anyone with known risk factors for hepatitis C. Sexually transmitted infections (STIs)  You should be screened for sexually transmitted infections (STIs) including gonorrhea and chlamydia if:  You are sexually active and are younger than 48 years of age.  You are older than 48 years of age and your health care provider tells you that you are at risk for this type of infection.  Your sexual activity has changed since you were last screened and you are at an increased risk for chlamydia or gonorrhea. Ask your health care provider if you are at risk.  If you do not have HIV, but are at risk, it may be recommended that you take a prescription medicine daily to prevent HIV infection. This is called pre-exposure prophylaxis (PrEP). You are considered at risk if:  You are sexually active and do not regularly use condoms or know the HIV status of your partner(s).  You take drugs by injection.  You are sexually active with a partner  who has HIV. Talk with your health care provider about whether you are at high risk of being infected with HIV. If you choose to begin PrEP, you should first be tested for HIV. You should then be tested every 3 months for as long as you are taking PrEP. Pregnancy  If you are premenopausal and you may become pregnant, ask your health care provider about preconception counseling.  If you may become pregnant, take 400 to 800 micrograms (mcg) of folic acid  every day.  If you want to prevent pregnancy, talk to your health care provider about birth control (contraception). Osteoporosis and menopause  Osteoporosis is a disease in which the bones lose minerals and strength with aging. This can result in serious bone fractures. Your risk for osteoporosis can be identified using a bone density scan.  If you are 4 years of age or older, or if you are at risk for osteoporosis and fractures, ask your health care provider if you should be screened.  Ask your health care provider whether you should take a calcium or vitamin D supplement to lower your risk for osteoporosis.  Menopause may have certain physical symptoms and risks.  Hormone replacement therapy may reduce some of these symptoms and risks. Talk to your health care provider about whether hormone replacement therapy is right for you. Follow these instructions at home:  Schedule regular health, dental, and eye exams.  Stay current with your immunizations.  Do not use any tobacco products including cigarettes, chewing tobacco, or electronic cigarettes.  If you are pregnant, do not drink alcohol.  If you are breastfeeding, limit how much and how often you drink alcohol.  Limit alcohol intake to no more than 1 drink per day for nonpregnant women. One drink equals 12 ounces of beer, 5 ounces of wine, or 1 ounces of hard liquor.  Do not use street drugs.  Do not share needles.  Ask your health care provider for help if you need support  or information about quitting drugs.  Tell your health care provider if you often feel depressed.  Tell your health care provider if you have ever been abused or do not feel safe at home. This information is not intended to replace advice given to you by your health care provider. Make sure you discuss any questions you have with your health care provider. Document Released: 04/18/2011 Document Revised: 03/10/2016 Document Reviewed: 07/07/2015 Elsevier Interactive Patient Education  2017 [employer].

## 2017-01-30 NOTE — Progress Notes (Signed)
Patient: Karen Morrison, Female    DOB: 14-Jun-1971, 46 y.o.   MRN: [id number] Visit Date: 01/30/2017  Today's Provider: Mar Daring, PA-C   Chief Complaint  Patient presents with  . Annual Exam   Subjective:    Annual physical exam Karen Morrison is a 46 y.o. female who presents today for health maintenance and complete physical. She feels well. She reports exercising not active. She reports she is sleeping well.  01/29/16 CPE 01/29/16 Pap-neg; HPV-neg 11/23/16 Mammogram-BI-RADS 1 ----------------------------------------------------------------- Patient requesting medication to help with weight loss. Patient reports that she has taken medication in the past and reports loosing about 50 lb on phentermine. She is not currently exercising or calorie counting.   Review of Systems  Constitutional: Positive for diaphoresis.  HENT: Positive for sneezing.   Eyes: Positive for discharge.  Respiratory: Negative.   Cardiovascular: Negative.   Gastrointestinal: Negative.   Endocrine: Negative.   Genitourinary: Negative.   Musculoskeletal: Negative.   Skin: Negative.   Allergic/Immunologic: Positive for environmental allergies.  Neurological: Negative.   Hematological: Negative.   Psychiatric/Behavioral: Negative.     Social History      She  reports that she has never smoked. She has never used smokeless tobacco. She reports that she does not drink alcohol or use drugs.       Social History   Social History  . Marital status: Married    Spouse name: Edd Arbour  . Number of children: 2  . Years of education: HS   Social History Main Topics  . Smoking status: Never Smoker  . Smokeless tobacco: Never Used  . Alcohol use No  . Drug use: No  . Sexual activity: Yes   Other Topics Concern  . None   Social History Narrative  . None    Past Medical History:  Diagnosis Date  . Allergic rhinitis   . Hypercholesteremia   . Obesity      Patient Active Problem  List   Diagnosis Date Noted  . Encounter for birth control 12/29/2015  . Allergic rhinitis 11/12/2015  . Calculus of gallbladder 11/12/2015  . Hypercholesteremia 11/12/2015  . Bone spur 11/12/2015  . Extreme obesity 11/12/2015  . H/O total knee replacement 12/21/2014    Past Surgical History:  Procedure Laterality Date  . CESAREAN SECTION  1994  . CHOLECYSTECTOMY  1999  . REPLACEMENT TOTAL KNEE  01/02/2013    Family History        Family Status  Relation Status  . Mother Alive  . Paternal Advertising account executive  . Maternal Grandmother Alive  . Maternal Grandfather Deceased  . Paternal Grandfather Deceased  . Paternal Grandmother Deceased  . Father Alive  . Sister Alive  . Daughter Alive  . Son Alive  . Sister Alive        Her family history includes Alzheimer's disease in her maternal grandmother; Breast cancer in her paternal aunt; Cancer in her paternal aunt; Diabetes in her mother; Emphysema in her paternal grandfather; Healthy in her father; Heart attack in her maternal grandfather.     Allergies  Allergen Reactions  . Azithromycin   . Hydrocodone-Acetaminophen   . Penicillins   . Amoxicillin Rash and Swelling     Current Outpatient Prescriptions:  .  cetirizine (ZYRTEC) 10 MG tablet, Take 10 mg by mouth daily., Disp: , Rfl:  .  etodolac (LODINE) 500 MG tablet, Take 1 tablet (500 mg total) by mouth 2 (two) times daily., Disp:  180 tablet, Rfl: 3 .  levonorgestrel-ethinyl estradiol (MYZILRA) tablet, Take 1 tablet by mouth  daily, Disp: 84 tablet, Rfl: 12 .  Multiple Vitamin (MULTI-VITAMINS) TABS, Take by mouth., Disp: , Rfl:    Patient Care Team: Mar Daring, PA-C as PCP - General (Family Medicine)      Objective:   Vitals: BP 136/90 (BP Location: Left Arm, Patient Position: Sitting, Cuff Size: Large)   Pulse 90   Temp 98.4 F (36.9 C) (Oral)   Resp 20   Ht 5\' 5"  (1.651 m)   Wt 283 lb (128.4 kg)   LMP 01/19/2017   SpO2 97%   BMI 47.09 kg/m    Vitals:    01/30/17 0913  BP: 136/90  Pulse: 90  Resp: 20  Temp: 98.4 F (36.9 C)  TempSrc: Oral  SpO2: 97%  Weight: 283 lb (128.4 kg)  Height: 5\' 5"  (1.651 m)     Physical Exam  Constitutional: She is oriented to person, place, and time. She appears well-developed and well-nourished. No distress.  HENT:  Head: Normocephalic and atraumatic.  Right Ear: Hearing, tympanic membrane, external ear and ear canal normal.  Left Ear: Hearing, tympanic membrane, external ear and ear canal normal.  Nose: Nose normal.  Mouth/Throat: Uvula is midline, oropharynx is clear and moist and mucous membranes are normal. No oropharyngeal exudate.  Eyes: Conjunctivae and EOM are normal. Pupils are equal, round, and reactive to light. Right eye exhibits no discharge. Left eye exhibits no discharge. No scleral icterus.  Neck: Normal range of motion. Neck supple. No JVD present. No tracheal deviation present. No thyromegaly present.  Cardiovascular: Normal rate, regular rhythm, normal heart sounds and intact distal pulses.  Exam reveals no gallop and no friction rub.   No murmur heard. Pulmonary/Chest: Effort normal and breath sounds normal. No respiratory distress. She has no wheezes. She has no rales. She exhibits no tenderness.  Abdominal: Soft. Bowel sounds are normal. She exhibits no distension and no mass. There is no tenderness. There is no rebound and no guarding.  Genitourinary:  Genitourinary Comments: Patient deferred pelvic because pap was done in 2017 and deferred breast exam because she does self exams and mammo was normal in 11/2016  Musculoskeletal: Normal range of motion. She exhibits no edema or tenderness.  Lymphadenopathy:    She has no cervical adenopathy.  Neurological: She is alert and oriented to person, place, and time.  Skin: Skin is warm and dry. No rash noted. She is not diaphoretic.  Psychiatric: She has a normal mood and affect. Her behavior is normal. Judgment and thought content  normal.  Vitals reviewed.    Depression Screen PHQ 2/9 Scores 01/30/2017 01/29/2016  PHQ - 2 Score 0 0  PHQ- 9 Score 0 -      Assessment & Plan:     Routine Health Maintenance and Physical Exam  Exercise Activities and Dietary recommendations Goals    None      Immunization History  Administered Date(s) Administered  . Td 07/19/2005  . Tdap 01/23/2015    Health Maintenance  Topic Date Due  . HIV Screening  04/30/1984  . INFLUENZA VACCINE  05/17/2017  . PAP SMEAR  01/29/2019  . TETANUS/TDAP  01/22/2025     Discussed health benefits of physical activity, and encouraged her to engage in regular exercise appropriate for her age and condition.    1. Annual physical exam Normal physical exam today. Will check labs as below and f/u pending lab results. If  labs are stable and WNL she will not need to have these rechecked for one year at her next annual physical exam. She is to call the office in the meantime if she has any acute issue, questions or concerns.  2. Hypercholesteremia Diet controlled. Will check labs as below and f/u pending results. - CBC with Differential/Platelet - Comprehensive metabolic panel - Lipid Panel With LDL/HDL Ratio - TSH  3. Morbid obesity, unspecified obesity type ([place]) Will check labs as below and f/u pending results. Discussed weight loss and medication assisted weight loss. Being that she has not made any lifestyle changes she is going to try to start a food diary again and work on changes over the next 4 weeks. I will see her back at that time and check her progress. Will consider starting phentermine at that time.  - TSH  4. BMI 45.0-49.9, adult [facility]) See above medical treatment plan.  5. Screening for HIV (human immunodeficiency virus) - HIV antibody  6. Encounter for other contraceptive management Stable. Diagnosis pulled for medication refill. Continue current medical treatment plan. - levonorgestrel-ethinyl estradiol (MYZILRA)  tablet; Take 1 tablet by mouth  daily  Dispense: 84 tablet; Refill: 12  7. H/O total knee replacement, left Stable. Diagnosis pulled for medication refill. Continue current medical treatment plan. - etodolac (LODINE) 500 MG tablet; Take 1 tablet (500 mg total) by mouth 2 (two) times daily.  Dispense: 180 tablet; Refill: 3  --------------------------------------------------------------------    Mar Daring, PA-C  [facility]

## 2017-01-31 LAB — CBC WITH DIFFERENTIAL/PLATELET
BASOS: 0 %
Basophils Absolute: 0 10*3/uL (ref 0.0–0.2)
EOS (ABSOLUTE): 0.2 10*3/uL (ref 0.0–0.4)
EOS: 1 %
HEMATOCRIT: 43.1 % (ref 34.0–46.6)
Hemoglobin: 14.3 g/dL (ref 11.1–15.9)
IMMATURE GRANS (ABS): 0 10*3/uL (ref 0.0–0.1)
IMMATURE GRANULOCYTES: 0 %
LYMPHS: 22 %
Lymphocytes Absolute: 2.6 10*3/uL (ref 0.7–3.1)
MCH: 28.4 pg (ref 26.6–33.0)
MCHC: 33.2 g/dL (ref 31.5–35.7)
MCV: 86 fL (ref 79–97)
MONOS ABS: 0.9 10*3/uL (ref 0.1–0.9)
Monocytes: 7 %
NEUTROS PCT: 70 %
Neutrophils Absolute: 8.2 10*3/uL — ABNORMAL HIGH (ref 1.4–7.0)
PLATELETS: 359 10*3/uL (ref 150–379)
RBC: 5.03 x10E6/uL (ref 3.77–5.28)
RDW: 14.5 % (ref 12.3–15.4)
WBC: 11.8 10*3/uL — AB (ref 3.4–10.8)

## 2017-01-31 LAB — LIPID PANEL WITH LDL/HDL RATIO
Cholesterol, Total: 217 mg/dL — ABNORMAL HIGH (ref 100–199)
HDL: 65 mg/dL (ref >39)
LDL CALC: 120 mg/dL — AB (ref 0–99)
LDL/HDL RATIO: 1.8 ratio (ref 0.0–3.2)
TRIGLYCERIDES: 158 mg/dL — AB (ref 0–149)
VLDL CHOLESTEROL CAL: 32 mg/dL (ref 5–40)

## 2017-01-31 LAB — COMPREHENSIVE METABOLIC PANEL
A/G RATIO: 1.6 (ref 1.2–2.2)
ALT: 20 IU/L (ref 0–32)
AST: 18 IU/L (ref 0–40)
Albumin: 4.1 g/dL (ref 3.5–5.5)
Alkaline Phosphatase: 98 IU/L (ref 39–117)
BUN/Creatinine Ratio: 12 (ref 9–23)
BUN: 9 mg/dL (ref 6–24)
Bilirubin Total: 0.4 mg/dL (ref 0.0–1.2)
CALCIUM: 9 mg/dL (ref 8.7–10.2)
CHLORIDE: 97 mmol/L (ref 96–106)
CO2: 30 mmol/L — AB (ref 18–29)
CREATININE: 0.74 mg/dL (ref 0.57–1.00)
GFR, EST AFRICAN AMERICAN: 112 mL/min/{1.73_m2} (ref >59)
GFR, EST NON AFRICAN AMERICAN: 97 mL/min/{1.73_m2} (ref >59)
Globulin, Total: 2.6 g/dL (ref 1.5–4.5)
Glucose: 91 mg/dL (ref 65–99)
Potassium: 4.5 mmol/L (ref 3.5–5.2)
SODIUM: 138 mmol/L (ref 134–144)
Total Protein: 6.7 g/dL (ref 6.0–8.5)

## 2017-01-31 LAB — TSH: TSH: 3.7 u[IU]/mL (ref 0.450–4.500)

## 2017-01-31 LAB — HIV ANTIBODY (ROUTINE TESTING W REFLEX): HIV Screen 4th Generation wRfx: NONREACTIVE

## 2017-01-31 NOTE — Progress Notes (Signed)
Advised  ED 

## 2017-02-07 ENCOUNTER — Encounter: Payer: Self-pay | Admitting: Physician Assistant

## 2017-02-07 ENCOUNTER — Ambulatory Visit (INDEPENDENT_AMBULATORY_CARE_PROVIDER_SITE_OTHER): Payer: 59 | Admitting: Physician Assistant

## 2017-02-07 VITALS — BP 150/90 | HR 112 | Temp 98.6°F | Resp 16 | Wt 284.2 lb

## 2017-02-07 DIAGNOSIS — R109 Unspecified abdominal pain: Secondary | ICD-10-CM

## 2017-02-07 DIAGNOSIS — N921 Excessive and frequent menstruation with irregular cycle: Secondary | ICD-10-CM

## 2017-02-07 LAB — POCT URINALYSIS DIPSTICK
Glucose, UA: NEGATIVE
Ketones, UA: NEGATIVE
Nitrite, UA: NEGATIVE
PH UA: 6.5 (ref 5.0–8.0)
PROTEIN UA: NEGATIVE
Spec Grav, UA: 1.01 (ref 1.010–1.025)
Urobilinogen, UA: 0.2 E.U./dL

## 2017-02-07 LAB — POCT URINE PREGNANCY: Preg Test, Ur: NEGATIVE

## 2017-02-07 NOTE — Progress Notes (Signed)
Patient: Karen Morrison Female    DOB: 04/03/1971   46 y.o.   MRN: [id number] Visit Date: 02/07/2017  Today's Provider: Mar Daring, PA-C   Chief Complaint  Patient presents with  . Vaginal Bleeding   Subjective:    HPI Patient is here with c/o spotting and cramping since Sunday. She is on OCP. She reports is very regular and she is due to start her cycle next week. She reports that she is having hot flashes, low back pain, cramping on lower abdomen pain/cramping. Is not heavy is just spotting. Spotting is a little more today.She only see the blood when she wipes. LMP:01/19/2017  She has been on current OCP since she was 47 years old. She has never had break through bleeding with it. She does have a strong family history of female issues. She is unsure of everything but states out of her mother and sisters, she is the only one that still has all her female organs. All the other women in her family have had hysterectomies before going through menopause.   Symptoms started with some abdominal cramping c/w normal menstrual cycle for her followed by some spotting yesterday and today she reports having a normal flow. Per her OCP she should not start her menstrual cycle until next Thursday (02/16/17). She has never started early. She reports her menstrual cycle has been regular, with starting on Thursday and ending on Tuesday. She reports always having a heavy cycle during this time.    Allergies  Allergen Reactions  . Azithromycin   . Hydrocodone-Acetaminophen   . Penicillins   . Amoxicillin Rash and Swelling     Current Outpatient Prescriptions:  .  cetirizine (ZYRTEC) 10 MG tablet, Take 10 mg by mouth daily., Disp: , Rfl:  .  etodolac (LODINE) 500 MG tablet, Take 1 tablet (500 mg total) by mouth 2 (two) times daily., Disp: 180 tablet, Rfl: 3 .  levonorgestrel-ethinyl estradiol (MYZILRA) tablet, Take 1 tablet by mouth  daily, Disp: 84 tablet, Rfl: 12 .  Multiple Vitamin  (MULTI-VITAMINS) TABS, Take by mouth., Disp: , Rfl:   Review of Systems  Constitutional: Negative.   Respiratory: Negative.   Cardiovascular: Negative.   Gastrointestinal: Negative.   Genitourinary: Positive for hematuria and vaginal bleeding (spotting with a little discharge; now a normal menstrual flow). Negative for dysuria, frequency, pelvic pain, urgency, vaginal discharge and vaginal pain.  Musculoskeletal: Positive for back pain.  Neurological: Negative for dizziness and headaches.    Social History  Substance Use Topics  . Smoking status: Never Smoker  . Smokeless tobacco: Never Used  . Alcohol use No   Objective:   BP (!) 150/90 (BP Location: Left Arm, Patient Position: Sitting, Cuff Size: Large)   Pulse (!) 112   Temp 98.6 F (37 C) (Oral)   Resp 16   Wt 284 lb 3.2 oz (128.9 kg)   LMP 01/19/2017   BMI 47.29 kg/m  Vitals:   02/07/17 1116  BP: (!) 150/90  Pulse: (!) 112  Resp: 16  Temp: 98.6 F (37 C)  TempSrc: Oral  Weight: 284 lb 3.2 oz (128.9 kg)     Physical Exam  Constitutional: She is oriented to person, place, and time. She appears well-developed and well-nourished. No distress.  Cardiovascular: Normal rate, regular rhythm and normal heart sounds.  Exam reveals no gallop and no friction rub.   No murmur heard. Pulmonary/Chest: Effort normal and breath sounds normal. No respiratory distress. She  has no wheezes. She has no rales.  Abdominal: Soft. Normal appearance and bowel sounds are normal. She exhibits no distension and no mass. There is no hepatosplenomegaly. There is tenderness in the suprapubic area. There is no rebound, no guarding and no CVA tenderness.  Neurological: She is alert and oriented to person, place, and time.  Skin: Skin is warm and dry. She is not diaphoretic.  Vitals reviewed.     Assessment & Plan:     1. Abdominal cramping UA was positive for blood c/w menses. There was some pyuria present but could be secondary to menstrual  cycle. Urine pregnancy was negative. She is to call if urinary symptoms worsen. Will refer to OB/GYN for further evaluation of abnormal menses due to family history. Referral has been placed as below. She is to call if there is any delay in appointment and I will order pelvic and transvaginal US if needed.  - POCT urinalysis dipstick - POCT urine pregnancy  2. Menorrhagia with irregular cycle See above medical treatment plan. - Ambulatory referral to Gynecology       Mar Daring, PA-C  [facility]

## 2017-02-07 NOTE — Patient Instructions (Signed)

## 2017-02-09 ENCOUNTER — Telehealth: Payer: Self-pay | Admitting: Physician Assistant

## 2017-02-09 NOTE — Telephone Encounter (Signed)
error 

## 2017-03-02 ENCOUNTER — Ambulatory Visit: Payer: 59 | Admitting: Physician Assistant

## 2017-03-06 ENCOUNTER — Encounter: Payer: Self-pay | Admitting: Obstetrics & Gynecology

## 2017-03-06 ENCOUNTER — Ambulatory Visit (INDEPENDENT_AMBULATORY_CARE_PROVIDER_SITE_OTHER): Payer: 59 | Admitting: Obstetrics & Gynecology

## 2017-03-06 VITALS — BP 140/90 | HR 104 | Ht 65.0 in | Wt 279.0 lb

## 2017-03-06 DIAGNOSIS — N921 Excessive and frequent menstruation with irregular cycle: Secondary | ICD-10-CM

## 2017-03-06 NOTE — Progress Notes (Signed)
HPI:      [name] is a 46 y.o. G2P2 who LMP was Patient's last menstrual period was 02/15/2017., presents today for a problem visit.  She complains of menometrorrhagia that  began several months ago and its severity is described as moderate.  She has regular periods every 28 days and they are associated with mild menstrual cramping.  She has used the following for attempts at control: maxi pad.  Dec- BTB light 3 days, again I Feb; then April heavy and painful BTB 4 days, then heavier period than normal in May with also 3 d BTB.  Previous evaluation: none. Prior Diagnosis: none. Previous Treatment: none.  SHe has been on OCPs except for 2 pregnancies for 30 years.  She is single partner, contraception - OCP (estrogen/progesterone).  Contraception: OCP (estrogen/progesterone). Hx of STDs: none. She is premenopausal.  PMHx: She  has a past medical history of Allergic rhinitis; Hypercholesteremia; and Obesity. 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 She has never used smokeless tobacco. She reports that she does not drink alcohol or use drugs.  She has a current medication list which includes the following prescription(s): cetirizine, etodolac, levonorgestrel-ethinyl estradiol, and multi-vitamins. Also, is allergic to azithromycin; hydrocodone-acetaminophen; penicillins; and amoxicillin.  Review of Systems  Constitutional: Negative for chills, fever and malaise/fatigue.  HENT: Negative for congestion, sinus pain and sore throat.   Eyes: Negative for blurred vision and pain.  Respiratory:  Negative for cough and wheezing.   Cardiovascular: Negative for chest pain and leg swelling.  Gastrointestinal: Negative for abdominal pain, constipation, diarrhea, heartburn, nausea and vomiting.  Genitourinary: Negative for dysuria, frequency, hematuria and urgency.  Musculoskeletal: Negative for back pain, joint pain, myalgias and neck pain.  Skin: Negative for itching and rash.  Neurological: Negative for dizziness, tremors and weakness.  Endo/Heme/Allergies: Does not bruise/bleed easily.  Psychiatric/Behavioral: Negative for depression. The patient is not nervous/anxious and does not have insomnia.     Objective: BP 140/90   Pulse (!) 104   Ht 5\' 5"  (1.651 m)   Wt 279 lb (126.6 kg)   LMP 02/15/2017   BMI 46.43 kg/m  Physical Exam  Constitutional: She is oriented to person, place, and time. She appears well-developed and well-nourished. No distress.  Genitourinary: Rectum normal, vagina normal and uterus normal. Pelvic exam was performed with patient supine. There is no rash or lesion on the right labia. There is no rash or lesion on the left labia. Vagina exhibits no lesion. No bleeding in the vagina. Right adnexum does not display mass and does not display tenderness. Left adnexum does not display mass and does not display tenderness. Cervix does not exhibit motion tenderness, lesion, friability or polyp.   Uterus is mobile and midaxial. Uterus is not enlarged or exhibiting a mass.  HENT:  Head: Normocephalic and atraumatic. Head is without laceration.  Right Ear: Hearing normal.  Left Ear: Hearing normal.  Nose: No epistaxis.  No foreign bodies.  Mouth/Throat: Uvula is midline, oropharynx is clear and moist and mucous membranes are normal.  Eyes: Pupils are equal, round, and reactive to light.  Neck: Normal range of motion. Neck supple. No  thyromegaly present.  Cardiovascular: Normal rate and regular rhythm.  Exam reveals no gallop and no friction rub.   No murmur  heard. Pulmonary/Chest: Effort normal and breath sounds normal. No respiratory distress. She has no wheezes. Right breast exhibits no mass, no skin change and no tenderness. Left breast exhibits no mass, no skin change and no tenderness.  Abdominal: Soft. Bowel sounds are normal. She exhibits no distension. There is no tenderness. There is no rebound.  Musculoskeletal: Normal range of motion.  Neurological: She is alert and oriented to person, place, and time. No cranial nerve deficit.  Skin: Skin is warm and dry.  Psychiatric: She has a normal mood and affect. Judgment normal.  Vitals reviewed.   ASSESSMENT/PLAN:  menometrorrhagia  Problem List Items Addressed This Visit    None    Visit Diagnoses    Menometrorrhagia    -  Primary   Relevant Orders   US Transvaginal Non-OB   US Pelvis Complete    Patient has abnormal uterine bleeding . She has a normal exam today, with no evidence of lesions.  Evaluation includes the following: exam, labs such as hormonal testing, and pelvic ultrasound to evaluate for any structural gynecologic abnormalities.  Patient to follow up after testing.  Treatment option for menorrhagia or menometrorrhagia discussed in great detail with the patient.  Options include hormonal therapy, IUD therapy such as Mirena, D&C, Ablation, and Hysterectomy.  The pros and cons of each option discussed with patient.   Barnett Applebaum, MD, [facility], [facility] 03/06/2017  5:16 PM

## 2017-03-06 NOTE — Patient Instructions (Signed)
Abnormal Uterine Bleeding Abnormal uterine bleeding can affect women at various stages in life, including teenagers, women in their reproductive years, pregnant women, and women who have reached menopause. Several kinds of uterine bleeding are considered abnormal, including:  Bleeding or spotting between periods.  Bleeding after sexual intercourse.  Bleeding that is heavier or more than normal.  Periods that last longer than usual.  Bleeding after menopause. Many cases of abnormal uterine bleeding are minor and simple to treat, while others are more serious. Any type of abnormal bleeding should be evaluated by your health care provider. Treatment will depend on the cause of the bleeding. Follow these instructions at home: Monitor your condition for any changes. The following actions may help to alleviate any discomfort you are experiencing:  Avoid the use of tampons and douches as directed by your health care provider.  Change your pads frequently. You should get regular pelvic exams and Pap tests. Keep all follow-up appointments for diagnostic tests as directed by your health care provider. Contact a health care provider if:  Your bleeding lasts more than 1 week.  You feel dizzy at times. Get help right away if:  You pass out.  You are changing pads every 15 to 30 minutes.  You have abdominal pain.  You have a fever.  You become sweaty or weak.  You are passing large blood clots from the vagina.  You start to feel nauseous and vomit. This information is not intended to replace advice given to you by your health care provider. Make sure you discuss any questions you have with your health care provider. Document Released: 10/03/2005 Document Revised: 03/16/2016 Document Reviewed: 05/02/2013 Elsevier Interactive Patient Education  2017 Elsevier Inc.  

## 2017-03-08 ENCOUNTER — Encounter: Payer: Self-pay | Admitting: Obstetrics and Gynecology

## 2017-03-09 ENCOUNTER — Ambulatory Visit
Admission: RE | Admit: 2017-03-09 | Discharge: 2017-03-09 | Disposition: A | Discharge status: Home / Self Care | Payer: 59 | Source: Ambulatory Visit | Attending: Obstetrics & Gynecology | Admitting: Obstetrics & Gynecology

## 2017-03-09 DIAGNOSIS — N888 Other specified noninflammatory disorders of cervix uteri: Secondary | ICD-10-CM | POA: Insufficient documentation

## 2017-03-09 DIAGNOSIS — N921 Excessive and frequent menstruation with irregular cycle: Secondary | ICD-10-CM | POA: Diagnosis not present

## 2017-03-10 ENCOUNTER — Ambulatory Visit (INDEPENDENT_AMBULATORY_CARE_PROVIDER_SITE_OTHER): Payer: 59 | Admitting: Obstetrics & Gynecology

## 2017-03-10 ENCOUNTER — Encounter: Payer: Self-pay | Admitting: Obstetrics & Gynecology

## 2017-03-10 DIAGNOSIS — N921 Excessive and frequent menstruation with irregular cycle: Secondary | ICD-10-CM | POA: Diagnosis not present

## 2017-03-10 NOTE — Progress Notes (Signed)
  HPI: Dysfunctional Uterine Bleeding Patient complains of irregular menses. She had been bleeding irregularly. She is now bleeding every 28 days and menses are lasting 7 days. She changes her pad or tampon every a few hours. Clots are 2-5 cm in size. Dysmenorrhea:moderate, occurring throughout menses. Cyclic symptoms include: BTB in between cyces now as well.. Current contraception: OCP (estrogen/progesterone). History of infertility: no. History of abnormal Pap smear: no.  Has been on OCPs for 30 years and desires to be off of hormones as less effective and recent affects on BP as well a concern.  Ultrasound demonstrates no masses seen These findings are Pelvis normal  PMHx: She  has a past medical history of Allergic rhinitis; Hypercholesteremia; and Obesity. 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 She has never used smokeless tobacco. She reports that she does not drink alcohol or use drugs.  She has a current medication list which includes the following prescription(s): cetirizine, etodolac, levonorgestrel-ethinyl estradiol, and multi-vitamins. Also, is allergic to azithromycin; hydrocodone-acetaminophen; penicillins; and amoxicillin.  Review of Systems  All other systems reviewed and are negative.  Objective: BP (!) 160/98   Pulse (!) 103   Ht 5\' 5"  (1.651 m)   Wt 278 lb (126.1 kg)   LMP 02/15/2017   BMI 46.26 kg/m   Physical examination Constitutional NAD, Conversant  Skin No rashes, lesions or ulceration.   Extremities: Moves all appropriately.  Normal ROM for age. No lymphadenopathy.  Neuro: Grossly intact    Psych: Oriented to PPT.  Normal mood. Normal affect.   Assessment:  Morbid obesity, unspecified obesity type ([place]), Menometrorrhagia Treatment option for menorrhagia or menometrorrhagia discussed in great detail with the patient.  Options include hormonal therapy, IUD therapy such as Mirena, D&C, Ablation, and Hysterectomy.  The pros and cons of each option discussed with patient. Pt prefers [facility] to all options, info provided to pt.  To schedule.  Barnett Applebaum, MD, [facility], [facility] 03/10/2017  11:52 AM

## 2017-03-10 NOTE — Patient Instructions (Signed)
Total Laparoscopic Hysterectomy °A total laparoscopic hysterectomy is a minimally invasive surgery to remove your uterus and cervix. This surgery is performed by making several small cuts (incisions) in your abdomen. It can also be done with a thin, lighted tube (laparoscope) inserted into two small incisions in your lower abdomen. Your fallopian tubes and ovaries can be removed (bilateral salpingo-oophorectomy) during this surgery as well. Benefits of minimally invasive surgery include: °· Less pain. °· Less risk of blood loss. °· Less risk of infection. °· Quicker return to normal activities. ° °Tell a health care provider about: °· Any allergies you have. °· All medicines you are taking, including vitamins, herbs, eye drops, creams, and over-the-counter medicines. °· Any problems you or family members have had with anesthetic medicines. °· Any blood disorders you have. °· Any surgeries you have had. °· Any medical conditions you have. °What are the risks? °Generally, this is a safe procedure. However, as with any procedure, complications can occur. Possible complications include: °· Bleeding. °· Blood clots in the legs or lung. °· Infection. °· Injury to surrounding organs. °· Problems with anesthesia. °· Early menopause symptoms (hot flashes, night sweats, insomnia). °· Risk of conversion to an open abdominal incision. ° °What happens before the procedure? °· Ask your health care provider about changing or stopping your regular medicines. °· Do not take aspirin or blood thinners (anticoagulants) for 1 week before the surgery or as told by your health care provider. °· Do not eat or drink anything for 8 hours before the surgery or as told by your health care provider. °· Quit smoking if you smoke. °· Arrange for a ride home after surgery and for someone to help you at home during recovery. °What happens during the procedure? °· You will be given antibiotic medicine. °· An IV tube will be placed in your arm. You  will be given medicine to make you sleep (general anesthetic). °· A gas (carbon dioxide) will be used to inflate your abdomen. This will allow your surgeon to look inside your abdomen, perform your surgery, and treat any other problems found if necessary. °· Three or four small incisions (often less than 1/2 inch) will be made in your abdomen. One of these incisions will be made in the area of your belly button (navel). The laparoscope will be inserted into the incision. Your surgeon will look through the laparoscope while doing your procedure. °· Other surgical instruments will be inserted through the other incisions. °· Your uterus may be removed through your vagina or cut into small pieces and removed through the small incisions. °· Your incisions will be closed. °What happens after the procedure? °· The gas will be released from inside your abdomen. °· You will be taken to the recovery area where a nurse will watch and check your progress. Once you are awake, stable, and taking fluids well, without other problems, you will return to your room or be allowed to go home. °· There is usually minimal discomfort following the surgery because the incisions are so small. °· You will be given pain medicine while you are in the hospital and for when you go home. °This information is not intended to replace advice given to you by your health care provider. Make sure you discuss any questions you have with your health care provider. °Document Released: 07/31/2007 Document Revised: 03/10/2016 Document Reviewed: 04/23/2013 °Elsevier Interactive Patient Education © 2017 Elsevier Inc. ° °

## 2017-03-14 ENCOUNTER — Telehealth: Payer: Self-pay | Admitting: Obstetrics & Gynecology

## 2017-03-14 NOTE — Telephone Encounter (Signed)
-----   Message from Gae Dry, MD sent at 03/10/2017 11:51 AM EDT ----- Regarding: surgery Surgery Booking Request Patient Full Name: @PATIENTNAME @  MRN: [id number]  DOB: 10-16-1971  Surgeon: Hoyt Koch, MD  Requested Surgery Date and Time: June 2018 Primary Diagnosis and Code: Menometrorrhagia Secondary Diagnosis and Code:  Surgical Procedure: TLH/BS L&D Notification: No Admission Status: same day surgery Length of Surgery: 1 Special Case Needs: no H&P: yes (date) Phone Interview or Office Pre-Admit: no Interpreter: no Language: e Medical Clearance: no Special Scheduling Instructions: no

## 2017-03-14 NOTE — Telephone Encounter (Signed)
Patient is aware of H&P on 03/27/17 @ 8am w/ Pre-Admit Testing afterwards, and OR on 04/04/17. Patient has my ext.

## 2017-03-15 ENCOUNTER — Ambulatory Visit: Payer: 59 | Admitting: Physician Assistant

## 2017-03-15 ENCOUNTER — Ambulatory Visit: Payer: 59 | Admitting: Obstetrics & Gynecology

## 2017-03-17 ENCOUNTER — Telehealth: Payer: Self-pay

## 2017-03-17 NOTE — Telephone Encounter (Signed)
FMLA/DISABILITY form filled out for [employer] and given to [place] for procesing.

## 2017-03-22 ENCOUNTER — Ambulatory Visit: Payer: 59 | Admitting: Physician Assistant

## 2017-03-27 ENCOUNTER — Encounter
Admission: RE | Admit: 2017-03-27 | Discharge: 2017-03-27 | Disposition: A | Discharge status: Home / Self Care | Payer: 59 | Source: Ambulatory Visit | Attending: Obstetrics & Gynecology | Admitting: Obstetrics & Gynecology

## 2017-03-27 ENCOUNTER — Encounter: Payer: Self-pay | Admitting: Obstetrics & Gynecology

## 2017-03-27 ENCOUNTER — Ambulatory Visit (INDEPENDENT_AMBULATORY_CARE_PROVIDER_SITE_OTHER): Payer: 59 | Admitting: Obstetrics & Gynecology

## 2017-03-27 VITALS — BP 158/82 | HR 102 | Ht 65.0 in | Wt 278.0 lb

## 2017-03-27 DIAGNOSIS — N921 Excessive and frequent menstruation with irregular cycle: Secondary | ICD-10-CM | POA: Diagnosis not present

## 2017-03-27 DIAGNOSIS — Z01812 Encounter for preprocedural laboratory examination: Secondary | ICD-10-CM | POA: Diagnosis present

## 2017-03-27 HISTORY — DX: Nausea with vomiting, unspecified: R11.2

## 2017-03-27 HISTORY — DX: Unspecified osteoarthritis, unspecified site: M19.90

## 2017-03-27 HISTORY — DX: Nausea with vomiting, unspecified: Z98.890

## 2017-03-27 LAB — TYPE AND SCREEN
ABO/RH(D): A POS
ANTIBODY SCREEN: NEGATIVE

## 2017-03-27 LAB — CBC
HEMATOCRIT: 42 % (ref 35.0–47.0)
HEMOGLOBIN: 14.2 g/dL (ref 12.0–16.0)
MCH: 28.8 pg (ref 26.0–34.0)
MCHC: 33.7 g/dL (ref 32.0–36.0)
MCV: 85.4 fL (ref 80.0–100.0)
Platelets: 332 10*3/uL (ref 150–440)
RBC: 4.92 MIL/uL (ref 3.80–5.20)
RDW: 14.5 % (ref 11.5–14.5)
WBC: 10.6 10*3/uL (ref 3.6–11.0)

## 2017-03-27 NOTE — Pre-Procedure Instructions (Signed)
Patient states the scopalamine patch helped a lot for her last surgery.  When it was removed after 24 hours she started vomiting.

## 2017-03-27 NOTE — Progress Notes (Signed)
PRE-OPERATIVE HISTORY AND PHYSICAL EXAM  HPI:  Karen Morrison is a 46 y.o. G2P2 Patient's last menstrual period was 03/16/2017 (exact date).; she is being admitted for surgery related to abnormal uterine bleeding. She had been bleeding irregularly. She is now bleeding every 28 days and menses are lasting 7 days. She changes her pad or tampon every a few hours. Clots are 2-5 cm in size. Dysmenorrhea:moderate, occurring throughout menses. Cyclic symptoms include: BTB in between cyces now as well.. Current contraception: OCP (estrogen/progesterone), has been on for 30 years and feels less effective now and does not desire to try other hormonal options nor ablation.  PMHx: Past Medical History:  Diagnosis Date  . Allergic rhinitis   . Hypercholesteremia   . Obesity    Past Surgical History:  Procedure Laterality Date  . CESAREAN SECTION  1994  . CHOLECYSTECTOMY  1999  . REPLACEMENT TOTAL KNEE  01/02/2013   Family History  Problem Relation Age of Onset  . Diabetes Mother        Type 2  . Breast cancer Paternal Aunt        38s?  Marland Kitchen Alzheimer's disease Maternal Grandmother   . Heart attack Maternal Grandfather   . Emphysema Paternal Grandfather   . Healthy Father    Social History  Substance Use Topics  . Smoking status: Never Smoker  . Smokeless tobacco: Never Used  . Alcohol use No   Meds: Lodine, OCP, Vitamins, Zyrtec Allergies: Hydrocodone-acetaminophen; Amoxicillin; Azithromycin; and Penicillins  Review of Systems  Constitutional: Negative for chills, fever and malaise/fatigue.  HENT: Negative for congestion, sinus pain and sore throat.   Eyes: Negative for blurred vision and pain.  Respiratory: Negative for cough and wheezing.   Cardiovascular: Negative for chest pain and leg swelling.  Gastrointestinal: Negative for abdominal pain, constipation, diarrhea, heartburn, nausea and vomiting.  Genitourinary: Negative for dysuria, frequency, hematuria and urgency.    Musculoskeletal: Negative for back pain, joint pain, myalgias and neck pain.  Skin: Negative for itching and rash.  Neurological: Negative for dizziness, tremors and weakness.  Endo/Heme/Allergies: Does not bruise/bleed easily.  Psychiatric/Behavioral: Negative for depression. The patient is not nervous/anxious and does not have insomnia.    Objective: BP (!) 158/82 (BP Location: Right Arm, Patient Position: Sitting, Cuff Size: Large)   Pulse (!) 102   Ht 5\' 5"  (1.651 m)   Wt 278 lb (126.1 kg)   LMP 03/16/2017 (Exact Date)   BMI 46.26 kg/m    Physical Exam  Constitutional: She is oriented to person, place, and time. She appears well-developed and well-nourished. No distress.  Genitourinary: Rectum normal, vagina normal and uterus normal. Pelvic exam was performed with patient supine. There is no rash or lesion on the right labia. There is no rash or lesion on the left labia. Vagina exhibits no lesion. No bleeding in the vagina. Right adnexum does not display mass and does not display tenderness. Left adnexum does not display mass and does not display tenderness. Cervix does not exhibit motion tenderness, lesion, friability or polyp.   Uterus is mobile and midaxial. Uterus is not enlarged or exhibiting a mass.  HENT:  Head: Normocephalic and atraumatic. Head is without laceration.  Right Ear: Hearing normal.  Left Ear: Hearing normal.  Nose: No epistaxis.  No foreign bodies.  Mouth/Throat: Uvula is midline, oropharynx is clear and moist and mucous membranes are normal.  Eyes: Pupils are equal, round, and reactive to light.  Neck: Normal range of  motion. Neck supple. No thyromegaly present.  Cardiovascular: Normal rate and regular rhythm.  Exam reveals no gallop and no friction rub.   No murmur heard. Pulmonary/Chest: Effort normal and breath sounds normal. No respiratory distress. She has no wheezes. Right breast exhibits no mass, no skin change and no tenderness. Left breast exhibits  no mass, no skin change and no tenderness.  Abdominal: Soft. Bowel sounds are normal. She exhibits no distension. There is no tenderness. There is no rebound.  Musculoskeletal: Normal range of motion.  Neurological: She is alert and oriented to person, place, and time. No cranial nerve deficit.  Skin: Skin is warm and dry.  Psychiatric: She has a normal mood and affect. Judgment normal.  Vitals reviewed.  Assessment: 1. Menometrorrhagia   2. Morbid obesity, unspecified obesity type ([place])   Desires TLH, BS, Cysto over other options discussed for heavy bleeding tx options. Obesity follow up/planning after surgery recovery  I have had a careful discussion with this patient about all the options available and the risk/benefits of each. I have fully informed this patient that surgery may subject her to a variety of discomforts and risks: She understands that most patients have surgery with little difficulty, but problems can happen ranging from minor to fatal. These include nausea, vomiting, pain, bleeding, infection, poor healing, hernia, or formation of adhesions. Unexpected reactions may occur from any drug or anesthetic given. Unintended injury may occur to other pelvic or abdominal structures such as Fallopian tubes, ovaries, bladder, ureter (tube from kidney to bladder), or bowel. Nerves going from the pelvis to the legs may be injured. Any such injury may require immediate or later additional surgery to correct the problem. Excessive blood loss requiring transfusion is very unlikely but possible. Dangerous blood clots may form in the legs or lungs. Physical and sexual activity will be restricted in varying degrees for an indeterminate period of time but most often 2-6 weeks.  Finally, she understands that it is impossible to list every possible undesirable effect and that the condition for which surgery is done is not always cured or significantly improved, and in rare cases may be even worse.Ample  time was given to answer all questions.  Barnett Applebaum, MD, [facility], [facility] 03/27/2017  8:10 AM

## 2017-03-27 NOTE — Patient Instructions (Signed)
  Your procedure is scheduled on: Tues. 04/04/17 Report to Day Surgery. To find out your arrival time please call [phone] between 1PM - 3PM on Mon. 04/03/17.  Remember: Instructions that are not followed completely may result in serious medical risk, up to and including death, or upon the discretion of your surgeon and anesthesiologist your surgery may need to be rescheduled.    __x__ 1. Do not eat food or drink liquids after midnight. No gum chewing or hard candies.     __x__ 2. No Alcohol for 24 hours before or after surgery.   ____ 3. Do Not Smoke For 24 Hours Prior to Your Surgery.   ____ 4. Bring all medications with you on the day of surgery if instructed.    __x__ 5. Notify your doctor if there is any change in your medical condition     (cold, fever, infections).       Do not wear jewelry, make-up, hairpins, clips or nail polish.  Do not wear lotions, powders, or perfumes. You may wear deodorant.  Do not shave 48 hours prior to surgery. Men may shave face and neck.  Do not bring valuables to the hospital.    [facility] is not responsible for any belongings or valuables.               Contacts, dentures or bridgework may not be worn into surgery.  Leave your suitcase in the car. After surgery it may be brought to your room.  For patients admitted to the hospital, discharge time is determined by your                treatment team.   Patients discharged the day of surgery will not be allowed to drive home.   Please read over the following fact sheets that you were given:      ____ Take these medicines the morning of surgery with A SIP OF WATER:    1.  2.   3.   4.  5.  6.  ____ Fleet Enema (as directed)   __x__ Use CHG Soap as directed  ____ Use inhalers on the day of surgery  ____ Stop metformin 2 days prior to surgery    ____ Take 1/2 of usual insulin dose the night before surgery and none on the morning of surgery.   ____ Stop Coumadin/Plavix/aspirin  on   __x__ Stop Anti-inflammatories on   etodolac (LODINE) 500 MG tablet, ibuprofen (ADVIL,MOTRIN) 200 MG tablet tomorrow ____ Stop supplements until after surgery.    ____ Bring C-Pap to the hospital.

## 2017-04-03 MED ORDER — CLINDAMYCIN PHOSPHATE 900 MG/50ML IV SOLN
900.0000 mg | Freq: Once | INTRAVENOUS | Status: AC
  Administered 2017-04-04: 900 mg via INTRAVENOUS

## 2017-04-04 ENCOUNTER — Encounter: Payer: Self-pay | Admitting: *Deleted

## 2017-04-04 ENCOUNTER — Ambulatory Visit: Payer: 59 | Admitting: Certified Registered Nurse Anesthetist

## 2017-04-04 ENCOUNTER — Ambulatory Visit
Admission: RE | Admit: 2017-04-04 | Discharge: 2017-04-04 | Disposition: A | Discharge status: Home / Self Care | Payer: 59 | Source: Ambulatory Visit | Attending: Obstetrics & Gynecology | Admitting: Obstetrics & Gynecology

## 2017-04-04 ENCOUNTER — Encounter
Admission: RE | Disposition: A | Discharge status: Home / Self Care | Payer: Self-pay | Source: Ambulatory Visit | Attending: Obstetrics & Gynecology

## 2017-04-04 DIAGNOSIS — N800 Endometriosis of uterus: Secondary | ICD-10-CM | POA: Diagnosis not present

## 2017-04-04 DIAGNOSIS — N921 Excessive and frequent menstruation with irregular cycle: Secondary | ICD-10-CM | POA: Diagnosis present

## 2017-04-04 DIAGNOSIS — N888 Other specified noninflammatory disorders of cervix uteri: Secondary | ICD-10-CM | POA: Insufficient documentation

## 2017-04-04 DIAGNOSIS — N838 Other noninflammatory disorders of ovary, fallopian tube and broad ligament: Secondary | ICD-10-CM | POA: Diagnosis not present

## 2017-04-04 DIAGNOSIS — Z6841 Body Mass Index (BMI) 40.0 and over, adult: Secondary | ICD-10-CM | POA: Insufficient documentation

## 2017-04-04 DIAGNOSIS — N72 Inflammatory disease of cervix uteri: Secondary | ICD-10-CM

## 2017-04-04 DIAGNOSIS — E669 Obesity, unspecified: Secondary | ICD-10-CM | POA: Insufficient documentation

## 2017-04-04 HISTORY — PX: TOTAL LAPAROSCOPIC HYSTERECTOMY WITH SALPINGECTOMY: SHX6742

## 2017-04-04 HISTORY — PX: CYSTOSCOPY: SHX5120

## 2017-04-04 LAB — POCT PREGNANCY, URINE: Preg Test, Ur: NEGATIVE

## 2017-04-04 LAB — ABO/RH: ABO/RH(D): A POS

## 2017-04-04 SURGERY — HYSTERECTOMY, TOTAL, LAPAROSCOPIC, WITH SALPINGECTOMY
Anesthesia: General | Laterality: Bilateral

## 2017-04-04 MED ORDER — FENTANYL CITRATE (PF) 100 MCG/2ML IJ SOLN
INTRAMUSCULAR | Status: AC
  Filled 2017-04-04: qty 2

## 2017-04-04 MED ORDER — PROPOFOL 10 MG/ML IV BOLUS
INTRAVENOUS | Status: AC
  Filled 2017-04-04: qty 20

## 2017-04-04 MED ORDER — SUCCINYLCHOLINE CHLORIDE 20 MG/ML IJ SOLN
INTRAMUSCULAR | Status: DC | PRN
  Administered 2017-04-04: 20 mg via INTRAVENOUS
  Administered 2017-04-04: 120 mg via INTRAVENOUS

## 2017-04-04 MED ORDER — SUGAMMADEX SODIUM 500 MG/5ML IV SOLN
INTRAVENOUS | Status: AC
  Filled 2017-04-04: qty 5

## 2017-04-04 MED ORDER — EPHEDRINE SULFATE 50 MG/ML IJ SOLN
INTRAMUSCULAR | Status: DC | PRN
  Administered 2017-04-04: 5 mg via INTRAVENOUS

## 2017-04-04 MED ORDER — PROPOFOL 10 MG/ML IV BOLUS
INTRAVENOUS | Status: DC | PRN
  Administered 2017-04-04: 250 mg via INTRAVENOUS

## 2017-04-04 MED ORDER — FAMOTIDINE 20 MG PO TABS
ORAL_TABLET | ORAL | Status: AC
  Administered 2017-04-04: 20 mg via ORAL
  Filled 2017-04-04: qty 1

## 2017-04-04 MED ORDER — MIDAZOLAM HCL 2 MG/2ML IJ SOLN
INTRAMUSCULAR | Status: AC
  Filled 2017-04-04: qty 2

## 2017-04-04 MED ORDER — ONDANSETRON HCL 4 MG/2ML IJ SOLN
INTRAMUSCULAR | Status: AC
  Filled 2017-04-04: qty 2

## 2017-04-04 MED ORDER — ACETAMINOPHEN 325 MG PO TABS: 650.0000 mg | ORAL_TABLET | ORAL | Status: DC | PRN

## 2017-04-04 MED ORDER — SCOPOLAMINE 1 MG/3DAYS TD PT72
1.0000 | MEDICATED_PATCH | Freq: Once | TRANSDERMAL | Status: DC
  Administered 2017-04-04: 1.5 mg via TRANSDERMAL

## 2017-04-04 MED ORDER — ONDANSETRON HCL 4 MG/2ML IJ SOLN
4.0000 mg | Freq: Once | INTRAMUSCULAR | Status: AC | PRN
  Administered 2017-04-04: 4 mg via INTRAVENOUS

## 2017-04-04 MED ORDER — LIDOCAINE HCL (PF) 2 % IJ SOLN
INTRAMUSCULAR | Status: AC
  Filled 2017-04-04: qty 2

## 2017-04-04 MED ORDER — BUPIVACAINE HCL (PF) 0.5 % IJ SOLN
INTRAMUSCULAR | Status: DC | PRN
  Administered 2017-04-04: 13 mL

## 2017-04-04 MED ORDER — MORPHINE SULFATE (PF) 4 MG/ML IV SOLN: 1.0000 mg | INTRAVENOUS | Status: DC | PRN

## 2017-04-04 MED ORDER — FENTANYL CITRATE (PF) 100 MCG/2ML IJ SOLN
INTRAMUSCULAR | Status: AC
  Administered 2017-04-04: 25 ug via INTRAVENOUS
  Filled 2017-04-04: qty 2

## 2017-04-04 MED ORDER — FENTANYL CITRATE (PF) 100 MCG/2ML IJ SOLN
25.0000 ug | INTRAMUSCULAR | Status: DC | PRN
  Administered 2017-04-04 (×4): 25 ug via INTRAVENOUS

## 2017-04-04 MED ORDER — ACETAMINOPHEN 650 MG RE SUPP
650.0000 mg | RECTAL | Status: DC | PRN
  Filled 2017-04-04: qty 1

## 2017-04-04 MED ORDER — LACTATED RINGERS IV SOLN
INTRAVENOUS | Status: DC
  Administered 2017-04-04: 07:00:00 via INTRAVENOUS

## 2017-04-04 MED ORDER — ACETAMINOPHEN 10 MG/ML IV SOLN
INTRAVENOUS | Status: AC
  Filled 2017-04-04: qty 100

## 2017-04-04 MED ORDER — DEXAMETHASONE SODIUM PHOSPHATE 10 MG/ML IJ SOLN
INTRAMUSCULAR | Status: DC | PRN
  Administered 2017-04-04: 10 mg via INTRAVENOUS

## 2017-04-04 MED ORDER — KETOROLAC TROMETHAMINE 30 MG/ML IJ SOLN
INTRAMUSCULAR | Status: DC | PRN
  Administered 2017-04-04: 30 mg via INTRAVENOUS

## 2017-04-04 MED ORDER — FAMOTIDINE 20 MG PO TABS
20.0000 mg | ORAL_TABLET | Freq: Once | ORAL | Status: AC
  Administered 2017-04-04: 20 mg via ORAL

## 2017-04-04 MED ORDER — CLINDAMYCIN PHOSPHATE 900 MG/50ML IV SOLN
INTRAVENOUS | Status: AC
  Filled 2017-04-04: qty 50

## 2017-04-04 MED ORDER — SCOPOLAMINE 1 MG/3DAYS TD PT72
MEDICATED_PATCH | TRANSDERMAL | Status: AC
  Administered 2017-04-04: 1.5 mg via TRANSDERMAL
  Filled 2017-04-04: qty 1

## 2017-04-04 MED ORDER — SUGAMMADEX SODIUM 500 MG/5ML IV SOLN
INTRAVENOUS | Status: DC | PRN
  Administered 2017-04-04: 252.2 mg via INTRAVENOUS

## 2017-04-04 MED ORDER — KETOROLAC TROMETHAMINE 30 MG/ML IJ SOLN: 30.0000 mg | Freq: Four times a day (QID) | INTRAMUSCULAR | Status: DC

## 2017-04-04 MED ORDER — ONDANSETRON HCL 4 MG/2ML IJ SOLN
INTRAMUSCULAR | Status: DC | PRN
  Administered 2017-04-04: 4 mg via INTRAVENOUS

## 2017-04-04 MED ORDER — ROCURONIUM BROMIDE 50 MG/5ML IV SOLN
INTRAVENOUS | Status: AC
  Filled 2017-04-04: qty 1

## 2017-04-04 MED ORDER — LIDOCAINE HCL (CARDIAC) 20 MG/ML IV SOLN
INTRAVENOUS | Status: DC | PRN
  Administered 2017-04-04: 100 mg via INTRAVENOUS

## 2017-04-04 MED ORDER — DEXAMETHASONE SODIUM PHOSPHATE 10 MG/ML IJ SOLN
INTRAMUSCULAR | Status: AC
  Filled 2017-04-04: qty 1

## 2017-04-04 MED ORDER — MIDAZOLAM HCL 2 MG/2ML IJ SOLN
INTRAMUSCULAR | Status: DC | PRN
  Administered 2017-04-04: 2 mg via INTRAVENOUS

## 2017-04-04 MED ORDER — KETOROLAC TROMETHAMINE 30 MG/ML IJ SOLN
INTRAMUSCULAR | Status: AC
Start: 2017-04-04 — End: 2017-04-04
  Filled 2017-04-04: qty 1

## 2017-04-04 MED ORDER — FENTANYL CITRATE (PF) 100 MCG/2ML IJ SOLN
INTRAMUSCULAR | Status: DC | PRN
  Administered 2017-04-04 (×2): 50 ug via INTRAVENOUS
  Administered 2017-04-04: 100 ug via INTRAVENOUS

## 2017-04-04 MED ORDER — PHENYLEPHRINE HCL 10 MG/ML IJ SOLN
INTRAMUSCULAR | Status: DC | PRN
  Administered 2017-04-04 (×9): 100 ug via INTRAVENOUS

## 2017-04-04 MED ORDER — OXYCODONE-ACETAMINOPHEN 5-325 MG PO TABS: 1.0000 | ORAL_TABLET | ORAL | 0 refills | Status: DC | PRN

## 2017-04-04 MED ORDER — ROCURONIUM BROMIDE 100 MG/10ML IV SOLN
INTRAVENOUS | Status: DC | PRN
  Administered 2017-04-04: 50 mg via INTRAVENOUS

## 2017-04-04 MED ORDER — ACETAMINOPHEN 10 MG/ML IV SOLN
INTRAVENOUS | Status: DC | PRN
  Administered 2017-04-04: 1000 mg via INTRAVENOUS

## 2017-04-04 SURGICAL SUPPLY — 51 items
ADH SKN CLS APL DERMABOND .7 (GAUZE/BANDAGES/DRESSINGS) ×2
BAG URO DRAIN 2000ML W/SPOUT (MISCELLANEOUS) ×4 IMPLANT
BLADE SURG SZ11 CARB STEEL (BLADE) ×4 IMPLANT
CANISTER SUCT 1200ML W/VALVE (MISCELLANEOUS) ×4 IMPLANT
CATH FOLEY 2WAY  5CC 16FR (CATHETERS) ×2
CATH FOLEY 2WAY 5CC 16FR (CATHETERS) ×2
CATH URTH 16FR FL 2W BLN LF (CATHETERS) ×2 IMPLANT
CHLORAPREP W/TINT 26ML (MISCELLANEOUS) ×4 IMPLANT
DEFOGGER SCOPE WARMER CLEARIFY (MISCELLANEOUS) ×4 IMPLANT
DERMABOND ADVANCED (GAUZE/BANDAGES/DRESSINGS) ×2
DERMABOND ADVANCED .7 DNX12 (GAUZE/BANDAGES/DRESSINGS) ×2 IMPLANT
DEVICE SUTURE ENDOST 10MM (ENDOMECHANICALS) ×4 IMPLANT
DRAPE CAMERA CLOSED 9X96 (DRAPES) ×4 IMPLANT
DRSG TEGADERM 2-3/8X2-3/4 SM (GAUZE/BANDAGES/DRESSINGS) ×6 IMPLANT
ENDOSTITCH 0 SINGLE 48 (SUTURE) ×24 IMPLANT
GAUZE SPONGE NON-WVN 2X2 STRL (MISCELLANEOUS) IMPLANT
GLOVE BIO SURGEON STRL SZ8 (GLOVE) ×20 IMPLANT
GLOVE INDICATOR 8.0 STRL GRN (GLOVE) ×4 IMPLANT
GOWN STRL REUS W/ TWL LRG LVL3 (GOWN DISPOSABLE) ×2 IMPLANT
GOWN STRL REUS W/ TWL XL LVL3 (GOWN DISPOSABLE) ×4 IMPLANT
GOWN STRL REUS W/TWL LRG LVL3 (GOWN DISPOSABLE) ×4
GOWN STRL REUS W/TWL XL LVL3 (GOWN DISPOSABLE) ×8
GRASPER SUT TROCAR 14GX15 (MISCELLANEOUS) ×4 IMPLANT
IRRIGATION STRYKERFLOW (MISCELLANEOUS) ×2 IMPLANT
IRRIGATOR STRYKERFLOW (MISCELLANEOUS) ×4
IV LACTATED RINGERS 1000ML (IV SOLUTION) ×4 IMPLANT
KIT PINK PAD W/HEAD ARE REST (MISCELLANEOUS) ×4
KIT PINK PAD W/HEAD ARM REST (MISCELLANEOUS) ×2 IMPLANT
KIT RM TURNOVER CYSTO AR (KITS) ×4 IMPLANT
LABEL OR SOLS (LABEL) ×4 IMPLANT
MANIPULATOR VCARE LG CRV RETR (MISCELLANEOUS) IMPLANT
MANIPULATOR VCARE SML CRV RETR (MISCELLANEOUS) IMPLANT
MANIPULATOR VCARE STD CRV RETR (MISCELLANEOUS) ×2 IMPLANT
NEEDLE VERESS 14GA 120MM (NEEDLE) ×4 IMPLANT
NS IRRIG 500ML POUR BTL (IV SOLUTION) ×4 IMPLANT
OCCLUDER COLPOPNEUMO (BALLOONS) ×4 IMPLANT
PACK GYN LAPAROSCOPIC (MISCELLANEOUS) ×4 IMPLANT
PAD OB MATERNITY 4.3X12.25 (PERSONAL CARE ITEMS) ×4 IMPLANT
PAD PREP 24X41 OB/GYN DISP (PERSONAL CARE ITEMS) ×4 IMPLANT
SCISSORS METZENBAUM CVD 33 (INSTRUMENTS) ×2 IMPLANT
SET CYSTO W/LG BORE CLAMP LF (SET/KITS/TRAYS/PACK) ×4 IMPLANT
SHEARS HARMONIC ACE PLUS 36CM (ENDOMECHANICALS) ×4 IMPLANT
SLEEVE ENDOPATH XCEL 5M (ENDOMECHANICALS) ×4 IMPLANT
SPONGE LAP 18X18 5 PK (GAUZE/BANDAGES/DRESSINGS) ×4 IMPLANT
SPONGE VERSALON 2X2 STRL (MISCELLANEOUS) ×8
SUT VIC AB 0 CT1 36 (SUTURE) ×4 IMPLANT
SYR 50ML LL SCALE MARK (SYRINGE) ×4 IMPLANT
SYRINGE 10CC LL (SYRINGE) ×4 IMPLANT
TROCAR ENDO BLADELESS 11MM (ENDOMECHANICALS) ×4 IMPLANT
TROCAR XCEL NON-BLD 5MMX100MML (ENDOMECHANICALS) ×4 IMPLANT
TUBING INSUF HEATED (TUBING) ×4 IMPLANT

## 2017-04-04 NOTE — Anesthesia Procedure Notes (Signed)
Procedure Name: Intubation Performed by: Mckennon Zwart Pre-anesthesia Checklist: Patient identified, Patient being monitored, Timeout performed, Emergency Drugs available and Suction available Patient Re-evaluated:Patient Re-evaluated prior to inductionOxygen Delivery Method: Circle system utilized Preoxygenation: Pre-oxygenation with 100% oxygen Intubation Type: IV induction Ventilation: Mask ventilation without difficulty Laryngoscope Size: Mac and 3 Grade View: Grade I Tube type: Oral Tube size: 7.0 mm Number of attempts: 1 Airway Equipment and Method: Stylet Placement Confirmation: ETT inserted through vocal cords under direct vision,  positive ETCO2 and breath sounds checked- equal and bilateral Secured at: 21 cm Tube secured with: Tape Dental Injury: Teeth and Oropharynx as per pre-operative assessment        

## 2017-04-04 NOTE — Discharge Instructions (Signed)
Total Laparoscopic Hysterectomy, Care After Refer to this sheet in the next few weeks. These instructions provide you with information on caring for yourself after your procedure. Your health care provider may also give you more specific instructions. Your treatment has been planned according to current medical practices, but problems sometimes occur. Call your health care provider if you have any problems or questions after your procedure. What can I expect after the procedure?  Pain and bruising at the incision sites. You will be given pain medicine to control it.  Menopausal symptoms such as hot flashes, night sweats, and insomnia if your ovaries were removed.  Sore throat from the breathing tube that was inserted during surgery. Follow these instructions at home:  Only take over-the-counter or prescription medicines for pain, discomfort, or fever as directed by your health care provider.  Do not take aspirin. It can cause bleeding.  Do not drive when taking pain medicine.  Follow your health care provider's advice regarding diet, exercise, lifting, driving, and general activities.  Resume your usual diet as directed and allowed.  Get plenty of rest and sleep.  Do not douche, use tampons, or have sexual intercourse for at least 6 weeks, or until your health care provider gives you permission.  Change your bandages (dressings) WEDNESDAY.  Monitor your temperature and notify your health care provider of a fever.  Take showers instead of baths for 2-3 weeks.  Do not drink alcohol until your health care provider gives you permission.  If you develop constipation, you may take a mild laxative with your health care provider's permission. Bran foods may help with constipation problems. Drinking enough fluids to keep your urine clear or pale yellow may help as well.  Try to have someone home with you for 1-2 weeks to help around the house.  Keep all of your follow-up appointments as  directed by your health care provider. Contact a health care provider if:  You have swelling, redness, or increasing pain around your incision sites.  You have pus coming from your incision.  You notice a bad smell coming from your incision.  Your incision breaks open.  You feel dizzy or lightheaded.  You have pain or bleeding when you urinate.  You have persistent diarrhea.  You have persistent nausea and vomiting.  You have abnormal vaginal discharge.  You have a rash.  You have any type of abnormal reaction or develop an allergy to your medicine.  You have poor pain control with your prescribed medicine. Get help right away if:  You have chest pain or shortness of breath.  You have severe abdominal pain that is not relieved with pain medicine.  You have pain or swelling in your legs. This information is not intended to replace advice given to you by your health care provider. Make sure you discuss any questions you have with your health care provider. Document Released: 07/24/2013 Document Revised: 03/10/2016 Document Reviewed: 04/23/2013 Elsevier Interactive Patient Education  2017 [place] Anesthesia, Adult, Care After These instructions provide you with information about caring for yourself after your procedure. Your health care provider may also give you more specific instructions. Your treatment has been planned according to current medical practices, but problems sometimes occur. Call your health care provider if you have any problems or questions after your procedure. What can I expect after the procedure? After the procedure, it is common to have:  Vomiting.  A sore throat.  Mental slowness.  It is common to  feel:  Nauseous.  Cold or shivery.  Sleepy.  Tired.  Sore or achy, even in parts of your body where you did not have surgery.  Follow these instructions at home: For at least 24 hours after the procedure:  Do  not: ? Participate in activities where you could fall or become injured. ? Drive. ? Use heavy machinery. ? Drink alcohol. ? Take sleeping pills or medicines that cause drowsiness. ? Make important decisions or sign legal documents. ? Take care of children on your own.  Rest. Eating and drinking  If you vomit, drink water, juice, or soup when you can drink without vomiting.  Drink enough fluid to keep your urine clear or pale yellow.  Make sure you have little or no nausea before eating solid foods.  Follow the diet recommended by your health care provider. General instructions  Have a responsible adult stay with you until you are awake and alert.  Return to your normal activities as told by your health care provider. Ask your health care provider what activities are safe for you.  Take over-the-counter and prescription medicines only as told by your health care provider.  If you smoke, do not smoke without supervision.  Keep all follow-up visits as told by your health care provider. This is important. Contact a health care provider if:  You continue to have nausea or vomiting at home, and medicines are not helpful.  You cannot drink fluids or start eating again.  You cannot urinate after 8-12 hours.  You develop a skin rash.  You have fever.  You have increasing redness at the site of your procedure. Get help right away if:  You have difficulty breathing.  You have chest pain.  You have unexpected bleeding.  You feel that you are having a life-threatening or urgent problem. This information is not intended to replace advice given to you by your health care provider. Make sure you discuss any questions you have with your health care provider. Document Released: 01/09/2001 Document Revised: 03/07/2016 Document Reviewed: 09/17/2015 Elsevier Interactive Patient Education  [employer].

## 2017-04-04 NOTE — Anesthesia Post-op Follow-up Note (Cosign Needed)
Anesthesia QCDR form completed.        

## 2017-04-04 NOTE — H&P (Signed)
History and Physical Interval Note:  04/04/2017 7:18 AM  Karen Morrison  has presented today for surgery, with the diagnosis of menometrorrhagia  The various methods of treatment have been discussed with the patient and family. After consideration of risks, benefits and other options for treatment, the patient has consented to  Procedure(s): HYSTERECTOMY TOTAL LAPAROSCOPIC WITH SALPINGECTOMY (Bilateral) as a surgical intervention .  The patient's history has been reviewed, patient examined, no change in status, stable for surgery.  Pt has the following beta blocker history-  Not taking Beta Blocker.  I have reviewed the patient's chart and labs.  Questions were answered to the patient's satisfaction.    Hoyt Koch

## 2017-04-04 NOTE — Transfer of Care (Signed)
Immediate Anesthesia Transfer of Care Note  Patient: Karen Morrison  Procedure(s) Performed: Procedure(s): HYSTERECTOMY TOTAL LAPAROSCOPIC WITH SALPINGECTOMY (Bilateral)  Patient Location: PACU  Anesthesia Type:General  Level of Consciousness: awake, alert  and oriented  Airway & Oxygen Therapy: Patient Spontanous Breathing and Patient connected to face mask oxygen  Post-op Assessment: Report given to RN and Post -op Vital signs reviewed and stable  Post vital signs: Reviewed and stable  Last Vitals:  Vitals:   04/04/17 0609  BP: 138/82  Pulse: (!) 105  Resp: 18  Temp: 36.8 C    Last Pain:  Vitals:   04/04/17 0609  TempSrc: Oral         Complications: No apparent anesthesia complications

## 2017-04-04 NOTE — Anesthesia Preprocedure Evaluation (Addendum)
Anesthesia Evaluation  Patient identified by MRN, date of birth, ID band Patient awake    Reviewed: Allergy & Precautions, NPO status , Patient's Chart, lab work & pertinent test results  History of Anesthesia Complications (+) PONV  Airway Mallampati: II       Dental  (+) Partial Lower   Pulmonary neg pulmonary ROS,           Cardiovascular negative cardio ROS       Neuro/Psych negative neurological ROS     GI/Hepatic negative GI ROS, Neg liver ROS,   Endo/Other  negative endocrine ROSMorbid obesity  Renal/GU negative Renal ROS     Musculoskeletal   Abdominal   Peds  Hematology   Anesthesia Other Findings   Reproductive/Obstetrics                            Anesthesia Physical Anesthesia Plan  ASA: III  Anesthesia Plan: General   Post-op Pain Management:    Induction:   PONV Risk Score and Plan: 4 or greater and Ondansetron, Dexamethasone, Propofol, Midazolam and Scopolamine patch - Pre-op  Airway Management Planned:   Additional Equipment:   Intra-op Plan:   Post-operative Plan:   Informed Consent: I have reviewed the patients History and Physical, chart, labs and discussed the procedure including the risks, benefits and alternatives for the proposed anesthesia with the patient or authorized representative who has indicated his/her understanding and acceptance.     Plan Discussed with:   Anesthesia Plan Comments:         Anesthesia Quick Evaluation

## 2017-04-04 NOTE — Anesthesia Postprocedure Evaluation (Signed)
Anesthesia Post Note  Patient: Ambrose Pancoast  Procedure(s) Performed: Procedure(s) (LRB): HYSTERECTOMY TOTAL LAPAROSCOPIC WITH SALPINGECTOMY (Bilateral) CYSTOSCOPY  Patient location during evaluation: PACU Anesthesia Type: General Level of consciousness: awake and alert Pain management: pain level controlled Vital Signs Assessment: post-procedure vital signs reviewed and stable Respiratory status: spontaneous breathing and respiratory function stable Cardiovascular status: stable Anesthetic complications: no     Last Vitals:  Vitals:   04/04/17 1050 04/04/17 1108  BP: (!) 152/93 (!) 132/99  Pulse: (!) 105 (!) 101  Resp: 16 16  Temp: 36.1 C     Last Pain:  Vitals:   04/04/17 1050  TempSrc: Temporal  PainSc: 0-No pain                 KEPHART,WILLIAM K

## 2017-04-04 NOTE — Op Note (Signed)
Operative Report:  PRE-OP DIAGNOSIS: menometrorrhagia   POST-OP DIAGNOSIS: menometrorrhagia   PROCEDURE: Procedure(s): HYSTERECTOMY TOTAL LAPAROSCOPIC WITH SALPINGECTOMY  SURGEON: Barnett Applebaum, MD, FACOG  ASSISTANT: Dr Glennon Mac   ANESTHESIA: General endotracheal anesthesia  ESTIMATED BLOOD LOSS: less than 50   SPECIMENS: Uterus, Tubes.  COMPLICATIONS: None  DISPOSITION: stable to PACU  FINDINGS: Intraabdominal adhesions were not noted. Normal ovaries.  PROCEDURE:  The patient was taken to the OR where anesthesia was administed. She was prepped and draped in the normal sterile fashion in the dorsal lithotomy position in the [place] stirrups. A time out was performed. A Graves speculum was inserted, the cervix was grasped with a single tooth tenaculum and the endometrial cavity was sounded. The cervix was progressively dilated to a size 18 [place] with [employer] dilators. A V-Care uterine manipulator was inserted in the usual fashion without incident. Gloves were changed and attention was turned to the abdomen.   An infraumbilical transverse 74mm skin incision was made with the scalpel after local anesthesia applied to the skin. A Veress-step needle was inserted in the usual fashion and confirmed using the hanging drop technique. A pneumoperitoneum was obtained by insufflation of CO2 (opening pressure of 65mmHg) to 48mmHg. A diagnostic laparoscopy was performed yielding the previously described findings. Attention was turned to the left lower quadrant where after visualization of the inferior epigastric vessels a 8mm skin incision was made with the scalpel. A 5 mm laparoscopic port was inserted. The same procedure was repeated in the right lower quadrant with a 66mm trocar. Attention was turned to the left aspect of the uterus, where after visualization of the ureter, the round ligament was coagulated and transected using the 42mm Harmonic Scapel. The anterior and posterior leafs of the broad ligament  were dissected off as the anterior one was coagulated and transected in a caudal direction towards the cuff of the uterine manipulator.  Attention was then turned to the left fallopian tube which was recognized by visualization of the fimbria. The tube is excised to its attachment to the uterus. The uterine-ovarian ligament and its blood vessels were carefully coagulated and transected using the Harmonic scapel.  Attention was turned to the right aspect of the uterus where the same procedure was performed.  The vesicouterine reflection of the peritoneum was dissected with the harmonic scapel and the bladder flap was created bluntly.  The uterine vessels were coagulated and transected bilaterally using first bipolar cautery and then the harmonic scapel. A 360 degree, circumferential colpotomy was done to completely amputate the uterus with cervix and tubes. Once the specimen was amputated it was delivered through the vagina.   The colpotomy was repaired in a simple interrupted fashion using a 0-Polysorb suture with an endo-stitch device.  Vaginal exam confirms complete closure.  The cavity was copiously irrigated. A survey of the pelvic cavity revealed adequate hemostasis and no injury to bowel, bladder, or ureter.   A diagnostic cystoscopy was performed using saline distension of bladder with no lesions or injuries noted.  Bilateral urine flow from each ureteral orifice is visualized.  At this point the procedure was finalized. All the instruments were removed from the patient's body. Gas was expelled and patient is leveled.  Incisions are closed with skin adhesive.    Patient goes to recovery room in stable condition.  All sponge, instrument, and needle counts are correct x2.     Barnett Applebaum, MD, [facility], [facility] 04/04/2017  9:42 AM

## 2017-04-05 LAB — SURGICAL PATHOLOGY

## 2017-04-17 ENCOUNTER — Encounter: Payer: Self-pay | Admitting: Obstetrics & Gynecology

## 2017-04-17 ENCOUNTER — Ambulatory Visit (INDEPENDENT_AMBULATORY_CARE_PROVIDER_SITE_OTHER): Payer: 59 | Admitting: Obstetrics & Gynecology

## 2017-04-17 VITALS — BP 150/90 | HR 102 | Ht 65.0 in | Wt 272.0 lb

## 2017-04-17 DIAGNOSIS — N921 Excessive and frequent menstruation with irregular cycle: Secondary | ICD-10-CM

## 2017-04-17 NOTE — Progress Notes (Signed)
  Postoperative Follow-up Patient presents post op from laparoscopic assisted vaginal hysterectomy for abnormal uterine bleeding, 2 weeks ago.  Subjective: Patient reports marked improvement in her preop symptoms. Eating a regular diet without difficulty. The patient is not having any pain.  Activity: normal activities of daily living. Patient reports vaginal sx's of None  Objective: BP (!) 150/90   Pulse (!) 102   Ht 5\' 5"  (1.651 m)   Wt 272 lb (123.4 kg)   LMP 03/16/2017 (Exact Date)   BMI 45.26 kg/m  Physical Exam  Constitutional: She is oriented to person, place, and time. She appears well-developed and well-nourished. No distress.  Cardiovascular: Normal rate.   Pulmonary/Chest: Effort normal.  Abdominal: Soft. She exhibits no distension. There is no tenderness.  Incision Healing Well   Musculoskeletal: Normal range of motion.  Neurological: She is alert and oriented to person, place, and time. No cranial nerve deficit.  Skin: Skin is warm and dry.  Psychiatric: She has a normal mood and affect.    Assessment: s/p :  total laparoscopic hysterectomy with bilateral salpingectomy stable  Plan: Patient has done well after surgery with no apparent complications.  I have discussed the post-operative course to date, and the expected progress moving forward.  The patient understands what complications to be concerned about.  I will see the patient in routine follow up, or sooner if needed.    Activity plan: No heavy lifting. May return to work in 29 days  Hoyt Koch 04/17/2017, 10:09 AM

## 2017-05-23 ENCOUNTER — Ambulatory Visit (INDEPENDENT_AMBULATORY_CARE_PROVIDER_SITE_OTHER): Payer: 59 | Admitting: Obstetrics & Gynecology

## 2017-05-23 ENCOUNTER — Encounter: Payer: Self-pay | Admitting: Obstetrics & Gynecology

## 2017-05-23 DIAGNOSIS — N921 Excessive and frequent menstruation with irregular cycle: Secondary | ICD-10-CM

## 2017-05-23 NOTE — Progress Notes (Signed)
  Postoperative Follow-up Patient presents post op from [place], BS, Cysto for abnormal uterine bleeding, 7 weeks ago.  Subjective: Patient reports marked improvement in her preop symptoms. Eating a regular diet without difficulty. The patient is not having any pain.  Activity: normal activities of daily living. Patient reports vaginal sx's of None  Objective: BP (!) 158/84   Pulse 98   Ht 5\' 5"  (1.651 m)   Wt 280 lb (127 kg)   LMP 03/16/2017 (Exact Date)   BMI 46.59 kg/m  Physical Exam  Constitutional: She is oriented to person, place, and time. She appears well-developed and well-nourished. No distress.  Genitourinary: Rectum normal and vagina normal. Pelvic exam was performed with patient supine. There is no rash, tenderness or lesion on the right labia. There is no rash, tenderness or lesion on the left labia. No erythema or bleeding in the vagina. Right adnexum does not display mass and does not display tenderness. Left adnexum does not display mass and does not display tenderness.  Genitourinary Comments: Cervix and uterus absent. Vaginal cuff healing well.  Cardiovascular: Normal rate.   Pulmonary/Chest: Effort normal.  Abdominal: Soft. She exhibits no distension. There is no tenderness.  Incision healing well.  Musculoskeletal: Normal range of motion.  Neurological: She is alert and oriented to person, place, and time. No cranial nerve deficit.  Skin: Skin is warm and dry.  Psychiatric: She has a normal mood and affect.    Assessment: s/p :  total laparoscopic hysterectomy with bilateral salpingectomy progressing well  Plan: Patient has done well after surgery with no apparent complications.  I have discussed the post-operative course to date, and the expected progress moving forward.  The patient understands what complications to be concerned about.  I will see the patient in routine follow up, or sooner if needed.    Activity plan: No restriction.  Hoyt Koch 05/23/2017, 3:16 PM

## 2017-07-04 ENCOUNTER — Telehealth: Payer: Self-pay | Admitting: Physician Assistant

## 2017-07-04 DIAGNOSIS — Z96652 Presence of left artificial knee joint: Secondary | ICD-10-CM

## 2017-07-04 MED ORDER — ETODOLAC 500 MG PO TABS: 500.0000 mg | ORAL_TABLET | Freq: Every day | ORAL | 0 refills | Status: DC

## 2017-07-04 NOTE — Telephone Encounter (Signed)
Patient advised that Karen Morrison is out of the office this week.She reports that she has enough for this week, but wants to know if [facility]ac if yes patient is asking if we can send the medication to [facility] since [facility] doesn't [facility] it. [facility]ist [facility] do [facility] it on [facility].  Can we send some additional's for the patient or does it needs to wait until Karen Morrison gets back?  Thanks,  -Candice Lunney

## 2017-07-04 NOTE — Telephone Encounter (Signed)
Pt called saying she has not been able to get Etodolac 500mg  has not been in [employer].  She wants to know if you can switch it to something else.  Or try to see if [facility] it in [facility].  Her call back is [phone] or [phone]   Con Memos

## 2017-07-04 NOTE — Telephone Encounter (Signed)
Patient advised as directed below.  Thanks,  -Abrea Henle 

## 2017-07-04 NOTE — Telephone Encounter (Signed)
[facility], [facility] supply. Check with Tawanna Sat later if she wants it to be sent to mail order when/if they get it back in stock.

## 2017-07-10 ENCOUNTER — Other Ambulatory Visit: Payer: Self-pay | Admitting: Physician Assistant

## 2017-07-10 DIAGNOSIS — M170 Bilateral primary osteoarthritis of knee: Secondary | ICD-10-CM

## 2017-07-10 MED ORDER — MELOXICAM 7.5 MG PO TABS: 7.5000 mg | ORAL_TABLET | Freq: Every day | ORAL | 0 refills | Status: DC

## 2017-07-10 NOTE — Progress Notes (Signed)
Etodolac unavailable from manufacturer per fax from pharmacy thus patient switched to meloxicam 7.5mg .

## 2017-07-24 ENCOUNTER — Ambulatory Visit
Admission: RE | Admit: 2017-07-24 | Discharge: 2017-07-24 | Disposition: A | Discharge status: Home / Self Care | Payer: 59 | Source: Ambulatory Visit | Attending: Physician Assistant | Admitting: Physician Assistant

## 2017-07-24 ENCOUNTER — Encounter: Payer: Self-pay | Admitting: Physician Assistant

## 2017-07-24 ENCOUNTER — Ambulatory Visit: Payer: 59 | Admitting: Physician Assistant

## 2017-07-24 ENCOUNTER — Ambulatory Visit (INDEPENDENT_AMBULATORY_CARE_PROVIDER_SITE_OTHER): Payer: 59 | Admitting: Physician Assistant

## 2017-07-24 VITALS — BP 130/70 | HR 101 | Temp 99.0°F | Resp 16 | Wt 268.6 lb

## 2017-07-24 DIAGNOSIS — R197 Diarrhea, unspecified: Secondary | ICD-10-CM | POA: Diagnosis present

## 2017-07-24 DIAGNOSIS — R935 Abnormal findings on diagnostic imaging of other abdominal regions, including retroperitoneum: Secondary | ICD-10-CM | POA: Insufficient documentation

## 2017-07-24 DIAGNOSIS — R140 Abdominal distension (gaseous): Secondary | ICD-10-CM | POA: Insufficient documentation

## 2017-07-24 DIAGNOSIS — Z9049 Acquired absence of other specified parts of digestive tract: Secondary | ICD-10-CM | POA: Insufficient documentation

## 2017-07-24 NOTE — Progress Notes (Signed)
Patient: Karen Morrison Female    DOB: 1970-10-25   46 y.o.   MRN: [id number] Visit Date: 07/24/2017  Today's Provider: Mar Daring, PA-C   Chief Complaint  Patient presents with  . Diarrhea   Subjective:    Diarrhea   The current episode started 1 to 4 weeks ago. The problem has been unchanged. The stool consistency is described as watery. The patient states that diarrhea awakens (First week patient reports she did not make to the restroom) her from sleep. Associated symptoms include weight loss (reports she was 278 lbs and now 268 lbs). Pertinent negatives include no abdominal pain, bloating, coughing, fever, headaches, sweats or vomiting. Nothing aggravates the symptoms. There are no known risk factors. She has tried change of diet and increased fluids for the symptoms. The treatment provided no relief.   She had a Cholecystectomy in 1999. She reports that we this she had some loose stool but nothing like this. Hysterectomy: April 04, 2017.  No family hx of colon cancer, IBD or IBS.  Patient had homemade spaghetti the night before and lunch the next day prior to onset. Patient tried bland diet without change in symptoms. No nausea now. Only had one day of nausea early on (day 2). Can have from 1-6 BM daily, no trigger found for when she has one vs multiple BM daily. Does have tenesmus prior to BM almost every time. BM does relieve symptoms/cramping. Describes BM as watery, light brown in color, occasionally had yellowish tinge(a couple of times), malodorous, no melena, hematochezia or blood noted on toilet paper.   Wt Readings from Last 3 Encounters:  07/24/17 268 lb 9.6 oz (121.8 kg)  05/23/17 280 lb (127 kg)  04/17/17 272 lb (123.4 kg)      Allergies  Allergen Reactions  . Hydrocodone-Acetaminophen Nausea And Vomiting  . Amoxicillin Hives, Itching and Rash  . Azithromycin Hives, Itching and Rash  . Penicillins Hives, Itching and Rash    Has patient had a PCN  reaction causing immediate rash, facial/tongue/throat swelling, SOB or lightheadedness with hypotension: Yes Has patient had a PCN reaction causing severe rash involving mucus membranes or skin necrosis: Yes Has patient had a PCN reaction that required hospitalization: No Has patient had a PCN reaction occurring within the last 10 years: No If all of the above answers are "NO", then may proceed with Cephalosporin use.      Current Outpatient Prescriptions:  .  acetaminophen (TYLENOL) 325 MG tablet, Take 650 mg by mouth every 6 (six) hours as needed., Disp: , Rfl:  .  cetirizine (ZYRTEC) 10 MG tablet, Take 10 mg by mouth daily as needed for allergies. , Disp: , Rfl:  .  diphenhydrAMINE (BENADRYL) 25 MG tablet, Take 25 mg by mouth daily as needed for itching (hives)., Disp: , Rfl:  .  ibuprofen (ADVIL,MOTRIN) 200 MG tablet, Take 200 mg by mouth daily as needed for headache or moderate pain., Disp: , Rfl:  .  Multiple Vitamins-Minerals (HAIR SKIN AND NAILS FORMULA) TABS, Take 1 tablet by mouth daily., Disp: , Rfl:  .  meloxicam (MOBIC) 7.5 MG tablet, Take 1 tablet (7.5 mg total) by mouth daily. (Patient not taking: Reported on 07/24/2017), Disp: 90 tablet, Rfl: 0 .  oxyCODONE-acetaminophen (PERCOCET) 5-325 MG tablet, Take 1 tablet by mouth every 4 (four) hours as needed for moderate pain or severe pain. (Patient not taking: Reported on 05/23/2017), Disp: 42 tablet, Rfl: 0  Review of Systems  Constitutional: Positive for weight loss (reports she was 278 lbs and now 268 lbs). Negative for activity change, appetite change, diaphoresis, fatigue and fever.  HENT: Negative for congestion and postnasal drip.   Respiratory: Negative for cough, chest tightness and shortness of breath.   Cardiovascular: Negative for chest pain, palpitations and leg swelling.  Gastrointestinal: Positive for diarrhea. Negative for abdominal distention, abdominal pain, anal bleeding, bloating, blood in stool, constipation,  nausea, rectal pain and vomiting.  Genitourinary: Negative for dysuria, flank pain, frequency, menstrual problem and pelvic pain.  Musculoskeletal: Negative for back pain.  Neurological: Negative for dizziness, light-headedness and headaches.    Social History  Substance Use Topics  . Smoking status: Never Smoker  . Smokeless tobacco: Never Used  . Alcohol use No   Objective:   BP 130/70 (BP Location: Left Arm, Patient Position: Sitting, Cuff Size: Large)   Pulse (!) 101   Temp 99 F (37.2 C) (Oral)   Resp 16   Wt 268 lb 9.6 oz (121.8 kg)   LMP 03/16/2017 (Exact Date)   BMI 44.70 kg/m  Vitals:   07/24/17 1128  BP: 130/70  Pulse: (!) 101  Resp: 16  Temp: 99 F (37.2 C)  TempSrc: Oral  Weight: 268 lb 9.6 oz (121.8 kg)     Physical Exam  Constitutional: She is oriented to person, place, and time. She appears well-developed and well-nourished. No distress.  Cardiovascular: Normal rate, regular rhythm and normal heart sounds.  Exam reveals no gallop and no friction rub.   No murmur heard. Pulmonary/Chest: Effort normal and breath sounds normal. No respiratory distress. She has no wheezes. She has no rales.  Abdominal: Soft. Normal appearance and bowel sounds are normal. She exhibits no distension and no mass. There is no hepatosplenomegaly. There is tenderness in the left upper quadrant and left lower quadrant. There is no rebound, no guarding and no CVA tenderness.  Neurological: She is alert and oriented to person, place, and time.  Skin: Skin is warm and dry. She is not diaphoretic.  Vitals reviewed.       Assessment & Plan:     1. Diarrhea, unspecified type Unknown source. Will check labs as below and stool cultures to r/o infectious vs functional vs IBD cause vs obstruction/adhesion (recently had hysterectomy). I will f/u with patient following test results. Advised patient to use imodium as well to see if this will lessen BM. If diarrhea persists but work up is  negative I will refer to GI for further evaluation for IBS vs IBD.  - CBC w/Diff/Platelet - Stool C-Diff Toxin Assay - Stool Culture - Comprehensive Metabolic Panel (CMET) - DG Abd 2 Views; Future       Mar Daring, PA-C  [facility]

## 2017-07-24 NOTE — Patient Instructions (Signed)

## 2017-07-25 ENCOUNTER — Telehealth: Payer: Self-pay

## 2017-07-25 LAB — CBC WITH DIFFERENTIAL/PLATELET
BASOS ABS: 0 10*3/uL (ref 0.0–0.2)
BASOS: 1 %
EOS (ABSOLUTE): 0.1 10*3/uL (ref 0.0–0.4)
Eos: 1 %
Hematocrit: 43.5 % (ref 34.0–46.6)
Hemoglobin: 14.4 g/dL (ref 11.1–15.9)
Immature Grans (Abs): 0 10*3/uL (ref 0.0–0.1)
Immature Granulocytes: 0 %
LYMPHS: 32 %
Lymphocytes Absolute: 2.5 10*3/uL (ref 0.7–3.1)
MCH: 28.2 pg (ref 26.6–33.0)
MCHC: 33.1 g/dL (ref 31.5–35.7)
MCV: 85 fL (ref 79–97)
MONOS ABS: 0.7 10*3/uL (ref 0.1–0.9)
Monocytes: 9 %
NEUTROS ABS: 4.5 10*3/uL (ref 1.4–7.0)
NEUTROS PCT: 57 %
PLATELETS: 308 10*3/uL (ref 150–379)
RBC: 5.1 x10E6/uL (ref 3.77–5.28)
RDW: 14.6 % (ref 12.3–15.4)
WBC: 7.9 10*3/uL (ref 3.4–10.8)

## 2017-07-25 LAB — COMPREHENSIVE METABOLIC PANEL
A/G RATIO: 2.1 (ref 1.2–2.2)
ALK PHOS: 108 IU/L (ref 39–117)
ALT: 32 IU/L (ref 0–32)
AST: 25 IU/L (ref 0–40)
Albumin: 4.6 g/dL (ref 3.5–5.5)
BILIRUBIN TOTAL: 0.3 mg/dL (ref 0.0–1.2)
BUN / CREAT RATIO: 9 (ref 9–23)
BUN: 7 mg/dL (ref 6–24)
CO2: 25 mmol/L (ref 20–29)
Calcium: 9.6 mg/dL (ref 8.7–10.2)
Chloride: 101 mmol/L (ref 96–106)
Creatinine, Ser: 0.79 mg/dL (ref 0.57–1.00)
GFR calc Af Amer: 102 mL/min/{1.73_m2} (ref >59)
GFR calc non Af Amer: 89 mL/min/{1.73_m2} (ref >59)
GLUCOSE: 87 mg/dL (ref 65–99)
Globulin, Total: 2.2 g/dL (ref 1.5–4.5)
Potassium: 4.2 mmol/L (ref 3.5–5.2)
SODIUM: 144 mmol/L (ref 134–144)
Total Protein: 6.8 g/dL (ref 6.0–8.5)

## 2017-07-25 NOTE — Telephone Encounter (Signed)
Patient advised as below.  

## 2017-07-25 NOTE — Telephone Encounter (Signed)
-----   Message from Mar Daring, [place] sent at 07/25/2017  8:26 AM EDT ----- Blood work is normal. No elevated WBC count or signs of infection in blood work. Will await stool evals.

## 2017-07-25 NOTE — Telephone Encounter (Signed)
-----   Message from Mar Daring, [place] sent at 07/24/2017  5:04 PM EDT ----- Abdominal xray is unremarkable

## 2017-07-27 LAB — CLOSTRIDIUM DIFFICILE EIA: C DIFFICILE TOXINS A+ B, EIA: NEGATIVE

## 2017-07-29 LAB — STOOL CULTURE: E coli, Shiga toxin Assay: NEGATIVE

## 2017-07-31 ENCOUNTER — Telehealth: Payer: Self-pay

## 2017-07-31 NOTE — Telephone Encounter (Signed)
-----   Message from Mar Daring, PA-C sent at 07/31/2017  8:58 AM EDT ----- All stool studies were all negative. Are you still having diarrhea?

## 2017-07-31 NOTE — Telephone Encounter (Signed)
Patient advised as below.  

## 2017-10-18 ENCOUNTER — Other Ambulatory Visit: Payer: Self-pay | Admitting: Physician Assistant

## 2017-10-18 DIAGNOSIS — Z1231 Encounter for screening mammogram for malignant neoplasm of breast: Secondary | ICD-10-CM

## 2017-11-24 ENCOUNTER — Ambulatory Visit
Admission: RE | Admit: 2017-11-24 | Discharge: 2017-11-24 | Disposition: A | Discharge status: Home / Self Care | Payer: Managed Care, Other (non HMO) | Source: Ambulatory Visit | Attending: Physician Assistant | Admitting: Physician Assistant

## 2017-11-24 DIAGNOSIS — Z1231 Encounter for screening mammogram for malignant neoplasm of breast: Secondary | ICD-10-CM | POA: Diagnosis present

## 2017-11-27 ENCOUNTER — Telehealth: Payer: Self-pay

## 2017-11-27 NOTE — Telephone Encounter (Signed)
[email or web address]

## 2017-11-27 NOTE — Telephone Encounter (Signed)
Patient is returning your call and requesting a call back. °

## 2017-11-27 NOTE — Telephone Encounter (Signed)
Patient advised as directed below.  Thanks,  -Osceola Holian 

## 2017-11-27 NOTE — Telephone Encounter (Signed)
-----   Message from Mar Daring, PA-C sent at 11/27/2017  8:52 AM EST ----- Normal mammogram. Repeat screening in one year.

## 2018-01-03 ENCOUNTER — Other Ambulatory Visit: Payer: Self-pay | Admitting: Physician Assistant

## 2018-01-03 DIAGNOSIS — M170 Bilateral primary osteoarthritis of knee: Secondary | ICD-10-CM

## 2018-02-02 ENCOUNTER — Encounter: Payer: Self-pay | Admitting: Physician Assistant

## 2018-02-02 ENCOUNTER — Ambulatory Visit (INDEPENDENT_AMBULATORY_CARE_PROVIDER_SITE_OTHER): Payer: Managed Care, Other (non HMO) | Admitting: Physician Assistant

## 2018-02-02 VITALS — BP 124/82 | HR 86 | Temp 98.3°F | Resp 16 | Ht 65.0 in | Wt 267.0 lb

## 2018-02-02 DIAGNOSIS — Z6841 Body Mass Index (BMI) 40.0 and over, adult: Secondary | ICD-10-CM

## 2018-02-02 DIAGNOSIS — Z0000 Encounter for general adult medical examination without abnormal findings: Secondary | ICD-10-CM | POA: Diagnosis not present

## 2018-02-02 DIAGNOSIS — R8290 Unspecified abnormal findings in urine: Secondary | ICD-10-CM

## 2018-02-02 DIAGNOSIS — E7800 Pure hypercholesterolemia, unspecified: Secondary | ICD-10-CM

## 2018-02-02 DIAGNOSIS — M170 Bilateral primary osteoarthritis of knee: Secondary | ICD-10-CM | POA: Diagnosis not present

## 2018-02-02 MED ORDER — MELOXICAM 7.5 MG PO TABS: 7.5000 mg | ORAL_TABLET | Freq: Every day | ORAL | 1 refills | Status: DC

## 2018-02-02 NOTE — Patient Instructions (Signed)

## 2018-02-02 NOTE — Progress Notes (Signed)
Patient: Karen Morrison, Female    DOB: 19-Nov-1970, 47 y.o.   MRN: [id number] Visit Date: 02/02/2018  Today's Provider: Mar Daring, PA-C   Chief Complaint  Patient presents with  . Annual Exam   Subjective:    Annual physical exam Karen Morrison is a 47 y.o. female who presents today for health maintenance and complete physical. She feels well. She reports exercising about 3 times a week. She reports she is sleeping well, on average 7-8 hrs nightly.    Mammogram- 11/27/2017. Normal. Pap- 01/29/2016.  Normal HPV negative   Review of Systems  Constitutional: Negative.   HENT: Negative.   Eyes: Negative.   Respiratory: Negative.   Cardiovascular: Negative.   Gastrointestinal: Negative.   Endocrine: Negative.   Genitourinary: Negative.   Musculoskeletal: Negative.   Skin: Negative.   Allergic/Immunologic: Positive for environmental allergies.  Neurological: Negative.   Hematological: Negative.   Psychiatric/Behavioral: Negative.     Social History      She  reports that she has never smoked. She has never used smokeless tobacco. She reports that she does not drink alcohol or use drugs.       Social History   Socioeconomic History  . Marital status: Married    Spouse name: Edd Arbour  . Number of children: 2  . Years of education: HS  . Highest education level: Not on file  Occupational History  . Not on file  Social Needs  . Financial resource strain: Not on file  . Food insecurity:    Worry: Not on file    Inability: Not on file  . Transportation needs:    Medical: Not on file    Non-medical: Not on file  Tobacco Use  . Smoking status: Never Smoker  . Smokeless tobacco: Never Used  Substance and Sexual Activity  . Alcohol use: No    Alcohol/week: 0.0 oz  . Drug use: No  . Sexual activity: Yes    Birth control/protection: Pill, None  Lifestyle  . Physical activity:    Days per week: Not on file    Minutes per session: Not on file  . Stress:  Not on file  Relationships  . Social connections:    Talks on phone: Not on file    Gets together: Not on file    Attends religious service: Not on file    Active member of club or organization: Not on file    Attends meetings of clubs or organizations: Not on file    Relationship status: Not on file  Other Topics Concern  . Not on file  Social History Narrative  . Not on file    Past Medical History:  Diagnosis Date  . Allergic rhinitis   . Arthritis   . Hypercholesteremia   . Obesity   . PONV (postoperative nausea and vomiting)      Patient Active Problem List   Diagnosis Date Noted  . Menometrorrhagia 03/10/2017  . Morbid obesity, unspecified obesity type ([place]) 01/30/2017  . BMI 45.0-49.9, adult ([place]) 01/30/2017  . Allergic rhinitis 11/12/2015  . Calculus of gallbladder 11/12/2015  . Hypercholesteremia 11/12/2015  . Bone spur 11/12/2015  . H/O total knee replacement 12/21/2014    Past Surgical History:  Procedure Laterality Date  . CESAREAN SECTION  1994  . CHOLECYSTECTOMY  1999  . CYSTOSCOPY  04/04/2017   Procedure: CYSTOSCOPY;  Surgeon: Gae Dry, MD;  Location: [facility] ORS;  Service: Gynecology;;  . JOINT  REPLACEMENT     left knee  . REPLACEMENT TOTAL KNEE  01/02/2013  . TOTAL LAPAROSCOPIC HYSTERECTOMY WITH SALPINGECTOMY Bilateral 04/04/2017   Procedure: HYSTERECTOMY TOTAL LAPAROSCOPIC WITH SALPINGECTOMY;  Surgeon: Gae Dry, MD;  Location: [facility] ORS;  Service: Gynecology;  Laterality: Bilateral;  . VAGINAL BIRTH AFTER CESAREAN SECTION      Family History        Family Status  Relation Name Status  . Mother  Alive  . [employer]  . MGM  Alive  . MGF  Deceased  . PGF  Deceased  . PGM  Deceased  . Father  Alive  . Sister  Alive  . Daughter  Alive  . Son  Alive  . Sister  Alive        Her family history includes Alzheimer's disease in her maternal grandmother; Breast cancer in her paternal aunt; Diabetes in her mother; Emphysema in her  paternal grandfather; Healthy in her father; Heart attack in her maternal grandfather.      Allergies  Allergen Reactions  . Hydrocodone-Acetaminophen Nausea And Vomiting  . Amoxicillin Hives, Itching and Rash  . Azithromycin Hives, Itching and Rash  . Penicillins Hives, Itching and Rash    Has patient had a PCN reaction causing immediate rash, facial/tongue/throat swelling, SOB or lightheadedness with hypotension: Yes Has patient had a PCN reaction causing severe rash involving mucus membranes or skin necrosis: Yes Has patient had a PCN reaction that required hospitalization: No Has patient had a PCN reaction occurring within the last 10 years: No If all of the above answers are "NO", then may proceed with Cephalosporin use.      Current Outpatient Medications:  .  acetaminophen (TYLENOL) 325 MG tablet, Take 650 mg by mouth every 6 (six) hours as needed., Disp: , Rfl:  .  cetirizine (ZYRTEC) 10 MG tablet, Take 10 mg by mouth daily as needed for allergies. , Disp: , Rfl:  .  diphenhydrAMINE (BENADRYL) 25 MG tablet, Take 25 mg by mouth daily as needed for itching (hives)., Disp: , Rfl:  .  ibuprofen (ADVIL,MOTRIN) 200 MG tablet, Take 200 mg by mouth daily as needed for headache or moderate pain., Disp: , Rfl:  .  meloxicam (MOBIC) 7.5 MG tablet, TAKE 1 TABLET BY MOUTH EVERY DAY, Disp: 90 tablet, Rfl: 0 .  Multiple Vitamins-Minerals (HAIR SKIN AND NAILS FORMULA) TABS, Take 1 tablet by mouth daily., Disp: , Rfl:  .  oxyCODONE-acetaminophen (PERCOCET) 5-325 MG tablet, Take 1 tablet by mouth every 4 (four) hours as needed for moderate pain or severe pain. (Patient not taking: Reported on 05/23/2017), Disp: 42 tablet, Rfl: 0   Patient Care Team: Mar Daring, PA-C as PCP - General (Family Medicine)      Objective:   Vitals: BP 124/82   Pulse 86   Temp 98.3 F (36.8 C)   Resp 16   Ht 5\' 5"  (1.651 m)   Wt 267 lb (121.1 kg)   LMP 03/16/2017 (Exact Date)   SpO2 99%   BMI 44.43  kg/m    Vitals:   02/02/18 0850  BP: 124/82  Pulse: 86  Resp: 16  Temp: 98.3 F (36.8 C)  SpO2: 99%  Weight: 267 lb (121.1 kg)  Height: 5\' 5"  (1.651 m)     Physical Exam  Constitutional: She is oriented to person, place, and time. She appears well-developed and well-nourished. No distress.  HENT:  Head: Normocephalic and atraumatic.  Right Ear: Hearing, tympanic membrane,  external ear and ear canal normal.  Left Ear: Hearing, tympanic membrane, external ear and ear canal normal.  Nose: Nose normal.  Mouth/Throat: Uvula is midline, oropharynx is clear and moist and mucous membranes are normal. No oropharyngeal exudate.  Eyes: Pupils are equal, round, and reactive to light. Conjunctivae and EOM are normal. Right eye exhibits no discharge. Left eye exhibits no discharge. No scleral icterus.  Neck: Normal range of motion. Neck supple. No JVD present. No tracheal deviation present. No thyromegaly present.  Cardiovascular: Normal rate, regular rhythm, normal heart sounds and intact distal pulses. Exam reveals no gallop and no friction rub.  No murmur heard. Pulmonary/Chest: Effort normal and breath sounds normal. No respiratory distress. She has no wheezes. She has no rales. She exhibits no tenderness.  Abdominal: Soft. Bowel sounds are normal. She exhibits no distension and no mass. There is no tenderness. There is no rebound and no guarding.  Musculoskeletal: Normal range of motion. She exhibits no edema or tenderness.  Lymphadenopathy:    She has no cervical adenopathy.  Neurological: She is alert and oriented to person, place, and time.  Skin: Skin is warm and dry. No rash noted. She is not diaphoretic.  Psychiatric: She has a normal mood and affect. Her behavior is normal. Judgment and thought content normal.  Vitals reviewed.    Depression Screen PHQ 2/9 Scores 02/02/2018 01/30/2017 01/29/2016  PHQ - 2 Score 0 0 0  PHQ- 9 Score - 0 -      Assessment & Plan:     Routine  Health Maintenance and Physical Exam  Exercise Activities and Dietary recommendations Goals    None      Immunization History  Administered Date(s) Administered  . Td 07/19/2005  . Tdap 01/23/2015    Health Maintenance  Topic Date Due  . INFLUENZA VACCINE  05/17/2018  . PAP SMEAR  01/29/2019  . TETANUS/TDAP  01/22/2025  . HIV Screening  Completed     Discussed health benefits of physical activity, and encouraged her to engage in regular exercise appropriate for her age and condition.    1. Annual physical exam Normal physical exam today. Will check labs as below and f/u pending lab results. If labs are stable and WNL she will not need to have these rechecked for one year at her next annual physical exam. She is to call the office in the meantime if she has any acute issue, questions or concerns. - CBC w/Diff/Platelet - Comprehensive Metabolic Panel (CMET) - TSH - Lipid Profile - HgB A1c  2. Primary osteoarthritis of both knees Stable. Diagnosis pulled for medication refill. Continue current medical treatment plan. - meloxicam (MOBIC) 7.5 MG tablet; Take 1 tablet (7.5 mg total) by mouth daily.  Dispense: 90 tablet; Refill: 1  3. Cloudy urine Will check labs as below and f/u pending results. - Urinalysis, Routine w reflex microscopic  4. Hypercholesteremia Stable. Diet controlled. Will check labs as below and f/u pending results. - CBC w/Diff/Platelet - Comprehensive Metabolic Panel (CMET) - Lipid Profile - HgB A1c  5. BMI 40.0-44.9, adult [facility]) Counseled patient on healthy lifestyle modifications including dieting and exercise.  Patient is exercising 60 minutes daily and limiting portions. She has lost 20 pounds since last seen.  - CBC w/Diff/Platelet - Comprehensive Metabolic Panel (CMET) - Lipid Profile - HgB A1c    Mar Daring, PA-C  [facility]

## 2018-02-03 LAB — MICROSCOPIC EXAMINATION: Casts: NONE SEEN /lpf

## 2018-02-03 LAB — CBC WITH DIFFERENTIAL/PLATELET
BASOS: 1 %
Basophils Absolute: 0.1 10*3/uL (ref 0.0–0.2)
EOS (ABSOLUTE): 0.2 10*3/uL (ref 0.0–0.4)
EOS: 2 %
HEMATOCRIT: 43.1 % (ref 34.0–46.6)
Hemoglobin: 14.5 g/dL (ref 11.1–15.9)
IMMATURE GRANULOCYTES: 0 %
Immature Grans (Abs): 0 10*3/uL (ref 0.0–0.1)
Lymphocytes Absolute: 2.7 10*3/uL (ref 0.7–3.1)
Lymphs: 29 %
MCH: 29.1 pg (ref 26.6–33.0)
MCHC: 33.6 g/dL (ref 31.5–35.7)
MCV: 86 fL (ref 79–97)
MONOS ABS: 0.8 10*3/uL (ref 0.1–0.9)
Monocytes: 9 %
NEUTROS ABS: 5.7 10*3/uL (ref 1.4–7.0)
NEUTROS PCT: 59 %
PLATELETS: 285 10*3/uL (ref 150–379)
RBC: 4.99 x10E6/uL (ref 3.77–5.28)
RDW: 14.2 % (ref 12.3–15.4)
WBC: 9.4 10*3/uL (ref 3.4–10.8)

## 2018-02-03 LAB — URINALYSIS, ROUTINE W REFLEX MICROSCOPIC
BILIRUBIN UA: NEGATIVE
Glucose, UA: NEGATIVE
KETONES UA: NEGATIVE
Nitrite, UA: NEGATIVE
PROTEIN UA: NEGATIVE
RBC UA: NEGATIVE
SPEC GRAV UA: 1.008 (ref 1.005–1.030)
Urobilinogen, Ur: 0.2 mg/dL (ref 0.2–1.0)
pH, UA: 7.5 (ref 5.0–7.5)

## 2018-02-03 LAB — COMPREHENSIVE METABOLIC PANEL
A/G RATIO: 2.2 (ref 1.2–2.2)
ALT: 23 IU/L (ref 0–32)
AST: 26 IU/L (ref 0–40)
Albumin: 4.6 g/dL (ref 3.5–5.5)
Alkaline Phosphatase: 111 IU/L (ref 39–117)
BUN/Creatinine Ratio: 14 (ref 9–23)
BUN: 10 mg/dL (ref 6–24)
Bilirubin Total: 0.4 mg/dL (ref 0.0–1.2)
CALCIUM: 9.4 mg/dL (ref 8.7–10.2)
CHLORIDE: 101 mmol/L (ref 96–106)
CO2: 28 mmol/L (ref 20–29)
Creatinine, Ser: 0.71 mg/dL (ref 0.57–1.00)
GFR, EST AFRICAN AMERICAN: 116 mL/min/{1.73_m2} (ref >59)
GFR, EST NON AFRICAN AMERICAN: 101 mL/min/{1.73_m2} (ref >59)
GLOBULIN, TOTAL: 2.1 g/dL (ref 1.5–4.5)
Glucose: 86 mg/dL (ref 65–99)
POTASSIUM: 4.1 mmol/L (ref 3.5–5.2)
SODIUM: 142 mmol/L (ref 134–144)
TOTAL PROTEIN: 6.7 g/dL (ref 6.0–8.5)

## 2018-02-03 LAB — LIPID PANEL
CHOL/HDL RATIO: 3.7 ratio (ref 0.0–4.4)
Cholesterol, Total: 238 mg/dL — ABNORMAL HIGH (ref 100–199)
HDL: 64 mg/dL (ref >39)
LDL CALC: 154 mg/dL — AB (ref 0–99)
Triglycerides: 98 mg/dL (ref 0–149)
VLDL Cholesterol Cal: 20 mg/dL (ref 5–40)

## 2018-02-03 LAB — TSH: TSH: 3 u[IU]/mL (ref 0.450–4.500)

## 2018-02-03 LAB — HEMOGLOBIN A1C
ESTIMATED AVERAGE GLUCOSE: 103 mg/dL
HEMOGLOBIN A1C: 5.2 % (ref 4.8–5.6)

## 2018-03-05 ENCOUNTER — Ambulatory Visit: Payer: Managed Care, Other (non HMO) | Admitting: Physician Assistant

## 2018-03-05 ENCOUNTER — Encounter: Payer: Self-pay | Admitting: Physician Assistant

## 2018-03-05 VITALS — BP 162/108 | HR 95 | Temp 98.6°F | Wt 262.6 lb

## 2018-03-05 DIAGNOSIS — R030 Elevated blood-pressure reading, without diagnosis of hypertension: Secondary | ICD-10-CM | POA: Diagnosis not present

## 2018-03-05 DIAGNOSIS — J0140 Acute pansinusitis, unspecified: Secondary | ICD-10-CM

## 2018-03-05 MED ORDER — DOXYCYCLINE HYCLATE 100 MG PO TABS: 100.0000 mg | ORAL_TABLET | Freq: Two times a day (BID) | ORAL | 0 refills | Status: DC

## 2018-03-05 NOTE — Patient Instructions (Signed)

## 2018-03-05 NOTE — Progress Notes (Signed)
Patient: Karen Morrison Female    DOB: [birth year]   47 y.o.   MRN: [id number] Visit Date: 03/05/2018  Today's Provider: Mar Daring, PA-C   Chief Complaint  Patient presents with  . Cough  . Congestion   Subjective:    URI   This is a new problem. Episode onset: Wednesday. The problem has been gradually worsening. There has been no fever. Associated symptoms include congestion, coughing (productive), ear pain, sinus pain and a sore throat. Pertinent negatives include no headaches. Associated symptoms comments: Hoarseness . Treatments tried: DyQuil and NyQuil. The treatment provided mild relief.      Allergies  Allergen Reactions  . Hydrocodone-Acetaminophen Nausea And Vomiting  . Amoxicillin Hives, Itching and Rash  . Azithromycin Hives, Itching and Rash  . Penicillins Hives, Itching and Rash    Has patient had a PCN reaction causing immediate rash, facial/tongue/throat swelling, SOB or lightheadedness with hypotension: Yes Has patient had a PCN reaction causing severe rash involving mucus membranes or skin necrosis: Yes Has patient had a PCN reaction that required hospitalization: No Has patient had a PCN reaction occurring within the last 10 years: No If all of the above answers are "NO", then may proceed with Cephalosporin use.      Current Outpatient Medications:  .  acetaminophen (TYLENOL) 325 MG tablet, Take 650 mg by mouth every 6 (six) hours as needed., Disp: , Rfl:  .  cetirizine (ZYRTEC) 10 MG tablet, Take 10 mg by mouth daily as needed for allergies. , Disp: , Rfl:  .  diphenhydrAMINE (BENADRYL) 25 MG tablet, Take 25 mg by mouth daily as needed for itching (hives)., Disp: , Rfl:  .  ibuprofen (ADVIL,MOTRIN) 200 MG tablet, Take 200 mg by mouth daily as needed for headache or moderate pain., Disp: , Rfl:  .  meloxicam (MOBIC) 7.5 MG tablet, Take 1 tablet (7.5 mg total) by mouth daily., Disp: 90 tablet, Rfl: 1 .  Multiple Vitamins-Minerals (HAIR SKIN AND  NAILS FORMULA) TABS, Take 1 tablet by mouth daily., Disp: , Rfl:   Review of Systems  Constitutional: Negative.   HENT: Positive for congestion, ear pain, postnasal drip, sinus pain, sore throat and voice change.   Respiratory: Positive for cough (productive).   Neurological: Positive for dizziness. Negative for headaches.    Social History   Tobacco Use  . Smoking status: Never Smoker  . Smokeless tobacco: Never Used  Substance Use Topics  . Alcohol use: No    Alcohol/week: 0.0 oz   Objective:   BP (!) 162/108 (BP Location: Right Arm, Patient Position: Sitting, Cuff Size: Normal)   Pulse 95   Temp 98.6 F (37 C) (Oral)   Wt 262 lb 9.6 oz (119.1 kg)   LMP 03/16/2017 (Exact Date)   SpO2 98%   BMI 43.70 kg/m  Vitals:   03/05/18 1343  BP: (!) 162/108  Pulse: 95  Temp: 98.6 F (37 C)  TempSrc: Oral  SpO2: 98%  Weight: 262 lb 9.6 oz (119.1 kg)     Physical Exam  Constitutional: She appears well-developed and well-nourished. No distress.  HENT:  Head: Normocephalic and atraumatic.  Right Ear: Hearing, tympanic membrane, external ear and ear canal normal.  Left Ear: Hearing, tympanic membrane, external ear and ear canal normal.  Nose: Right sinus exhibits maxillary sinus tenderness and frontal sinus tenderness. Left sinus exhibits maxillary sinus tenderness and frontal sinus tenderness.  Mouth/Throat: Uvula is midline, oropharynx is clear and moist  and mucous membranes are normal. No oropharyngeal exudate.  Neck: Normal range of motion. Neck supple. No tracheal deviation present. No thyromegaly present.  Cardiovascular: Normal rate, regular rhythm and normal heart sounds. Exam reveals no gallop and no friction rub.  No murmur heard. Pulmonary/Chest: Effort normal and breath sounds normal. No stridor. No respiratory distress. She has no wheezes. She has no rales.  Lymphadenopathy:    She has no cervical adenopathy.  Skin: She is not diaphoretic.  Vitals reviewed.       Assessment & Plan:     1. Acute non-recurrent pansinusitis Worsening symptoms that have not responded to OTC medications. Will give Doxycycline as below. Continue allergy medications. Stay well hydrated and get plenty of rest. Call if no symptom improvement or if symptoms worsen. - doxycycline (VIBRA-TABS) 100 MG tablet; Take 1 tablet (100 mg total) by mouth 2 (two) times daily.  Dispense: 20 tablet; Refill: 0  2. Elevated blood pressure reading Suspect secondary to NyQuil and DayQuil use. Stop those. May use Mucinex or Mucinex DM or Delsym. She does have a cuff at home and is going to monitor and will call if remains elevated.       Mar Daring, PA-C  [facility]

## 2018-03-09 ENCOUNTER — Ambulatory Visit: Payer: Managed Care, Other (non HMO) | Admitting: Physician Assistant

## 2018-03-09 ENCOUNTER — Encounter: Payer: Self-pay | Admitting: Physician Assistant

## 2018-03-09 VITALS — BP 148/100 | HR 82 | Temp 98.1°F | Resp 16 | Wt 260.0 lb

## 2018-03-09 DIAGNOSIS — L270 Generalized skin eruption due to drugs and medicaments taken internally: Secondary | ICD-10-CM

## 2018-03-09 DIAGNOSIS — I10 Essential (primary) hypertension: Secondary | ICD-10-CM

## 2018-03-09 MED ORDER — HYDROCHLOROTHIAZIDE 25 MG PO TABS: 25.0000 mg | ORAL_TABLET | Freq: Every day | ORAL | 0 refills | Status: DC

## 2018-03-09 NOTE — Patient Instructions (Signed)
Hydrochlorothiazide, HCTZ capsules or tablets What is this medicine? HYDROCHLOROTHIAZIDE (hye droe klor oh THYE a zide) is a diuretic. It increases the amount of urine passed, which causes the body to lose salt and water. This medicine is used to treat high blood pressure. It is also reduces the swelling and water retention caused by various medical conditions, such as heart, liver, or kidney disease. This medicine may be used for other purposes; ask your health care provider or pharmacist if you have questions. COMMON BRAND NAME(S): Esidrix, Ezide, HydroDIURIL, Microzide, Oretic, Zide What should I tell my health care provider before I take this medicine? They need to know if you have any of these conditions: -diabetes -gout -immune system problems, like lupus -kidney disease or kidney stones -liver disease -pancreatitis -small amount of urine or difficulty passing urine -an unusual or allergic reaction to hydrochlorothiazide, sulfa drugs, other medicines, foods, dyes, or preservatives -pregnant or trying to get pregnant -breast-feeding How should I use this medicine? Take this medicine by mouth with a glass of water. Follow the directions on the prescription label. Take your medicine at regular intervals. Remember that you will need to pass urine frequently after taking this medicine. Do not take your doses at a time of day that will cause you problems. Do not stop taking your medicine unless your doctor tells you to. Talk to your pediatrician regarding the use of this medicine in children. Special care may be needed. Overdosage: If you think you have taken too much of this medicine contact a poison control center or emergency room at once. NOTE: This medicine is only for you. Do not share this medicine with others. What if I miss a dose? If you miss a dose, take it as soon as you can. If it is almost time for your next dose, take only that dose. Do not take double or extra doses. What may  interact with this medicine? -cholestyramine -colestipol -digoxin -dofetilide -lithium -medicines for blood pressure -medicines for diabetes -medicines that relax muscles for surgery -other diuretics -steroid medicines like prednisone or cortisone This list may not describe all possible interactions. Give your health care provider a list of all the medicines, herbs, non-prescription drugs, or dietary supplements you use. Also tell them if you smoke, drink alcohol, or use illegal drugs. Some items may interact with your medicine. What should I watch for while using this medicine? Visit your doctor or health care professional for regular checks on your progress. Check your blood pressure as directed. Ask your doctor or health care professional what your blood pressure should be and when you should contact him or her. You may need to be on a special diet while taking this medicine. Ask your doctor. Check with your doctor or health care professional if you get an attack of severe diarrhea, nausea and vomiting, or if you sweat a lot. The loss of too much body fluid can make it dangerous for you to take this medicine. You may get drowsy or dizzy. Do not drive, use machinery, or do anything that needs mental alertness until you know how this medicine affects you. Do not stand or sit up quickly, especially if you are an older patient. This reduces the risk of dizzy or fainting spells. Alcohol may interfere with the effect of this medicine. Avoid alcoholic drinks. This medicine may affect your blood sugar level. If you have diabetes, check with your doctor or health care professional before changing the dose of your diabetic medicine. This medicine  can make you more sensitive to the sun. Keep out of the sun. If you cannot avoid being in the sun, wear protective clothing and use sunscreen. Do not use sun lamps or tanning beds/booths. What side effects may I notice from receiving this medicine? Side effects  that you should report to your doctor or health care professional as soon as possible: -allergic reactions such as skin rash or itching, hives, swelling of the lips, mouth, tongue, or throat -changes in vision -chest pain -eye pain -fast or irregular heartbeat -feeling faint or lightheaded, falls -gout attack -muscle pain or cramps -pain or difficulty when passing urine -pain, tingling, numbness in the hands or feet -redness, blistering, peeling or loosening of the skin, including inside the mouth -unusually weak or tired Side effects that usually do not require medical attention (report to your doctor or health care professional if they continue or are bothersome): -change in sex drive or performance -dry mouth -headache -stomach upset This list may not describe all possible side effects. Call your doctor for medical advice about side effects. You may report side effects to FDA at 1-800-FDA-1088. Where should I keep my medicine? Keep out of the reach of children. Store at room temperature between 15 and 30 degrees C (59 and 86 degrees F). Do not freeze. Protect from light and moisture. Keep container closed tightly. Throw away any unused medicine after the expiration date. NOTE: This sheet is a summary. It may not cover all possible information. If you have questions about this medicine, talk to your doctor, pharmacist, or health care provider.  2018 Elsevier/Gold Standard (2010-05-28 12:57:37)  

## 2018-03-09 NOTE — Progress Notes (Signed)
Patient: Karen Morrison Female    DOB: Jun 19, 1971   47 y.o.   MRN: [id number] Visit Date: 03/09/2018  Today's Provider: Mar Daring, PA-C   Chief Complaint  Patient presents with  . Hypertension   Subjective:    HPI Patient here today C/O elevated blood pressure since Monday. Patient reports that she stopped taking OTC cough medication. Patient reports blood pressure has been in the 140's/90's. Patient denies any headaches, visual changes, or swelling around ankles.  Patient reports rash around neck since Wednesday, patient reports rash is worsening and spreadding into face and chest. Patient reports she has been using Benadryl cream and reports good symptom control.      Allergies  Allergen Reactions  . Hydrocodone-Acetaminophen Nausea And Vomiting  . Amoxicillin Hives, Itching and Rash  . Azithromycin Hives, Itching and Rash  . Penicillins Hives, Itching and Rash    Has patient had a PCN reaction causing immediate rash, facial/tongue/throat swelling, SOB or lightheadedness with hypotension: Yes Has patient had a PCN reaction causing severe rash involving mucus membranes or skin necrosis: Yes Has patient had a PCN reaction that required hospitalization: No Has patient had a PCN reaction occurring within the last 10 years: No If all of the above answers are "NO", then may proceed with Cephalosporin use.      Current Outpatient Medications:  .  acetaminophen (TYLENOL) 325 MG tablet, Take 650 mg by mouth every 6 (six) hours as needed., Disp: , Rfl:  .  cetirizine (ZYRTEC) 10 MG tablet, Take 10 mg by mouth daily as needed for allergies. , Disp: , Rfl:  .  diphenhydrAMINE (BENADRYL) 25 MG tablet, Take 25 mg by mouth daily as needed for itching (hives)., Disp: , Rfl:  .  doxycycline (VIBRA-TABS) 100 MG tablet, Take 1 tablet (100 mg total) by mouth 2 (two) times daily., Disp: 20 tablet, Rfl: 0 .  ibuprofen (ADVIL,MOTRIN) 200 MG tablet, Take 200 mg by mouth daily as  needed for headache or moderate pain., Disp: , Rfl:  .  meloxicam (MOBIC) 7.5 MG tablet, Take 1 tablet (7.5 mg total) by mouth daily., Disp: 90 tablet, Rfl: 1 .  Multiple Vitamins-Minerals (HAIR SKIN AND NAILS FORMULA) TABS, Take 1 tablet by mouth daily., Disp: , Rfl:   Review of Systems  Constitutional: Negative.   Respiratory: Negative.   Cardiovascular: Negative.   Skin: Positive for rash.  Neurological: Negative.     Social History   Tobacco Use  . Smoking status: Never Smoker  . Smokeless tobacco: Never Used  Substance Use Topics  . Alcohol use: No    Alcohol/week: 0.0 oz   Objective:   BP (!) 148/100 (BP Location: Right Arm, Patient Position: Sitting, Cuff Size: Large)   Pulse 82   Temp 98.1 F (36.7 C) (Oral)   Resp 16   Wt 260 lb (117.9 kg)   LMP 03/16/2017 (Exact Date)   SpO2 98%   BMI 43.27 kg/m  Vitals:   03/09/18 1408  BP: (!) 148/100  Pulse: 82  Resp: 16  Temp: 98.1 F (36.7 C)  TempSrc: Oral  SpO2: 98%  Weight: 260 lb (117.9 kg)    Physical Exam  Constitutional: She appears well-developed and well-nourished. No distress.  Neck: Normal range of motion. Neck supple. No JVD present. No tracheal deviation present. No thyromegaly present.  Cardiovascular: Normal rate, regular rhythm and normal heart sounds. Exam reveals no gallop and no friction rub.  No murmur heard. Pulmonary/Chest:  Effort normal and breath sounds normal. No respiratory distress. She has no wheezes. She has no rales.  Musculoskeletal: She exhibits no edema.  Lymphadenopathy:    She has no cervical adenopathy.  Skin: Rash noted. Rash is urticarial. She is not diaphoretic.     Vitals reviewed.       Assessment & Plan:     1. Essential hypertension Will start HCTZ as below. I will see her back in 4-6 weeks to recheck BP and make sure tolerating well. She is to call if she has any adverse effect or if BP still remains elevated.  - hydrochlorothiazide (HYDRODIURIL) 25 MG tablet;  Take 1 tablet (25 mg total) by mouth daily.  Dispense: 30 tablet; Refill: 0  2. Allergic drug rash Suspect from Doxycycline. Stop Doxycycline. Continue benadryl cream. May add zantac. Call if no improvements.        Mar Daring, PA-C  [facility]

## 2018-03-31 ENCOUNTER — Other Ambulatory Visit: Payer: Self-pay | Admitting: Physician Assistant

## 2018-03-31 DIAGNOSIS — I10 Essential (primary) hypertension: Secondary | ICD-10-CM

## 2018-04-06 ENCOUNTER — Encounter: Payer: Self-pay | Admitting: Physician Assistant

## 2018-04-06 ENCOUNTER — Ambulatory Visit: Payer: Managed Care, Other (non HMO) | Admitting: Physician Assistant

## 2018-04-06 DIAGNOSIS — I10 Essential (primary) hypertension: Secondary | ICD-10-CM | POA: Diagnosis not present

## 2018-04-06 MED ORDER — HYDROCHLOROTHIAZIDE 25 MG PO TABS: 25.0000 mg | ORAL_TABLET | Freq: Every day | ORAL | 1 refills | Status: DC

## 2018-04-06 NOTE — Progress Notes (Signed)
Patient: Karen Morrison Female    DOB: 1971/03/22   47 y.o.   MRN: [id number] Visit Date: 04/06/2018  Today's Provider: Mar Daring, PA-C   Chief Complaint  Patient presents with  . Follow-up   Subjective:    HPI  Follow up for hypertension  The patient was last seen for this 4 weeks ago. Changes made at last visit include start hydrochlorothiazide 25 mg daily.  She reports excellent compliance with treatment. She feels that condition is Improved. She is having side effects, constipation. ------------------------------------------------------------------------------------    Allergies  Allergen Reactions  . Hydrocodone-Acetaminophen Nausea And Vomiting  . Amoxicillin Hives, Itching and Rash  . Apple Cider Vinegar Rash  . Azithromycin Hives, Itching and Rash  . Doxycycline Rash  . Penicillins Hives, Itching and Rash    Has patient had a PCN reaction causing immediate rash, facial/tongue/throat swelling, SOB or lightheadedness with hypotension: Yes Has patient had a PCN reaction causing severe rash involving mucus membranes or skin necrosis: Yes Has patient had a PCN reaction that required hospitalization: No Has patient had a PCN reaction occurring within the last 10 years: No If all of the above answers are "NO", then may proceed with Cephalosporin use.      Current Outpatient Medications:  .  acetaminophen (TYLENOL) 325 MG tablet, Take 650 mg by mouth every 6 (six) hours as needed., Disp: , Rfl:  .  cetirizine (ZYRTEC) 10 MG tablet, Take 10 mg by mouth daily as needed for allergies. , Disp: , Rfl:  .  diphenhydrAMINE (BENADRYL) 25 MG tablet, Take 25 mg by mouth daily as needed for itching (hives)., Disp: , Rfl:  .  hydrochlorothiazide (HYDRODIURIL) 25 MG tablet, TAKE 1 TABLET BY MOUTH EVERY DAY, Disp: 90 tablet, Rfl: 1 .  ibuprofen (ADVIL,MOTRIN) 200 MG tablet, Take 200 mg by mouth daily as needed for headache or moderate pain., Disp: , Rfl:  .   meloxicam (MOBIC) 7.5 MG tablet, Take 1 tablet (7.5 mg total) by mouth daily., Disp: 90 tablet, Rfl: 1 .  Multiple Vitamins-Minerals (HAIR SKIN AND NAILS FORMULA) TABS, Take 1 tablet by mouth daily., Disp: , Rfl:   Review of Systems  Constitutional: Negative.   Respiratory: Negative.   Cardiovascular: Negative.   Gastrointestinal: Positive for constipation.    Social History   Tobacco Use  . Smoking status: Never Smoker  . Smokeless tobacco: Never Used  Substance Use Topics  . Alcohol use: No    Alcohol/week: 0.0 oz   Objective:   BP 130/80 (BP Location: Left Arm, Patient Position: Sitting, Cuff Size: Large)   Pulse 82   Temp 98.3 F (36.8 C) (Oral)   Resp 16   Ht 5\' 5"  (1.651 m)   Wt 255 lb (115.7 kg)   LMP 03/16/2017 (Exact Date)   SpO2 98%   BMI 42.43 kg/m  Vitals:   04/06/18 1424  BP: 130/80  Pulse: 82  Resp: 16  Temp: 98.3 F (36.8 C)  TempSrc: Oral  SpO2: 98%  Weight: 255 lb (115.7 kg)  Height: 5\' 5"  (1.651 m)     Physical Exam  Constitutional: She appears well-developed and well-nourished. No distress.  Neck: Normal range of motion. Neck supple.  Cardiovascular: Normal rate, regular rhythm and normal heart sounds. Exam reveals no gallop and no friction rub.  No murmur heard. Pulmonary/Chest: Effort normal and breath sounds normal. No respiratory distress. She has no wheezes. She has no rales.  Musculoskeletal: She exhibits  no edema.  Skin: She is not diaphoretic.  Vitals reviewed.      Assessment & Plan:     1. Essential hypertension Stable. Continue HCTZ as below. Continue weight loss journey. Doing well, lost 5 pounds over last 4 weeks.  - hydrochlorothiazide (HYDRODIURIL) 25 MG tablet; Take 1 tablet (25 mg total) by mouth daily.  Dispense: 90 tablet; Refill: Bell Arthur, PA-C  [facility]

## 2018-05-25 IMAGING — US US TRANSVAGINAL NON-OB
1 series · 13 of 25 positions shown · non-contrast
Comparison: None

CLINICAL DATA: Menometrorrhagia

EXAM:
TRANSABDOMINAL AND TRANSVAGINAL ULTRASOUND OF PELVIS
TECHNIQUE: Both transabdominal and transvaginal ultrasound examinations of the
pelvis were performed. Transabdominal technique was performed for
global imaging of the pelvis including uterus, ovaries, adnexal
regions, and pelvic cul-de-sac. It was necessary to proceed with
endovaginal exam following the transabdominal exam to visualize the
endometrium and ovaries.

[Series 1: us transvaginal non-ob · 0.30mm/px · 13 of 98 slices shown]
[im 1/98]
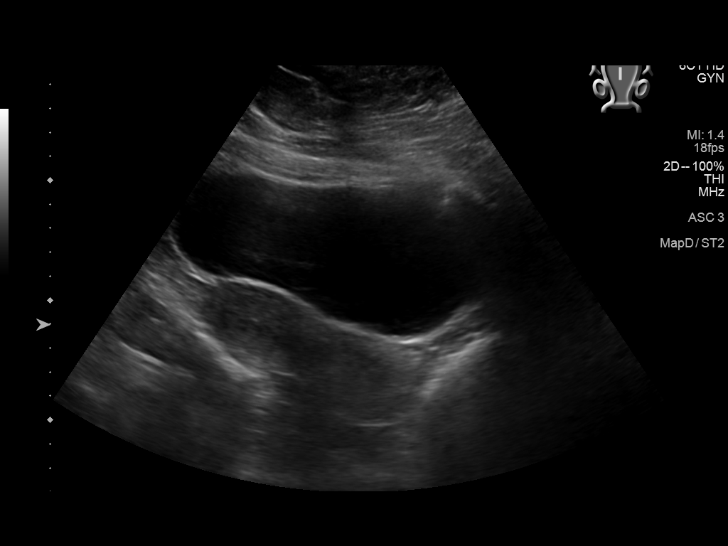
[im 9/98]
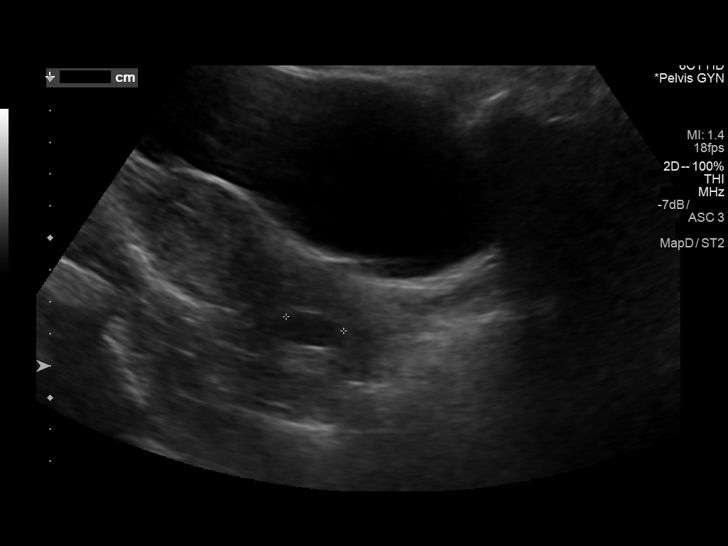
[im 17/98]
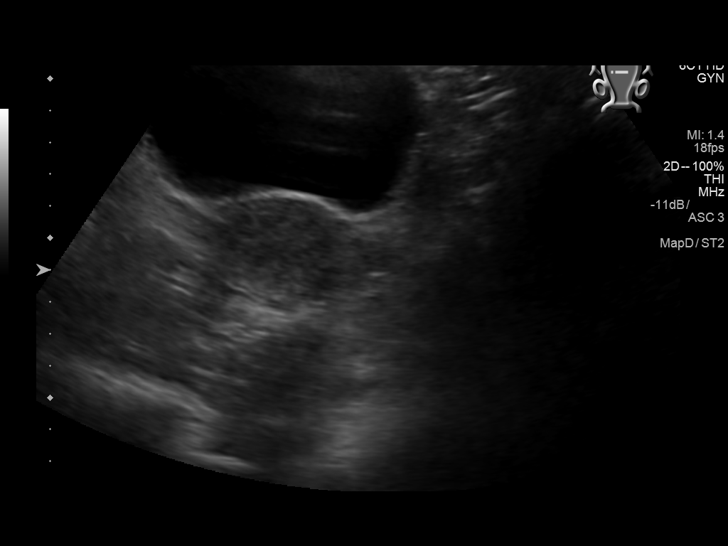
[im 25/98]
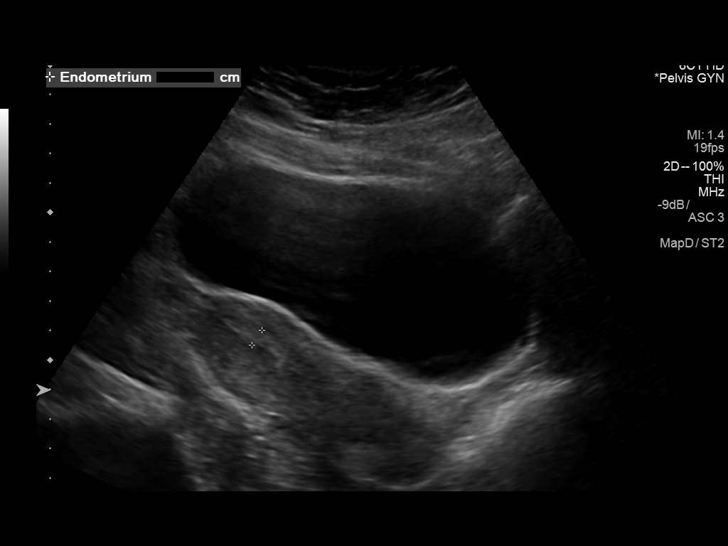
[im 33/98]
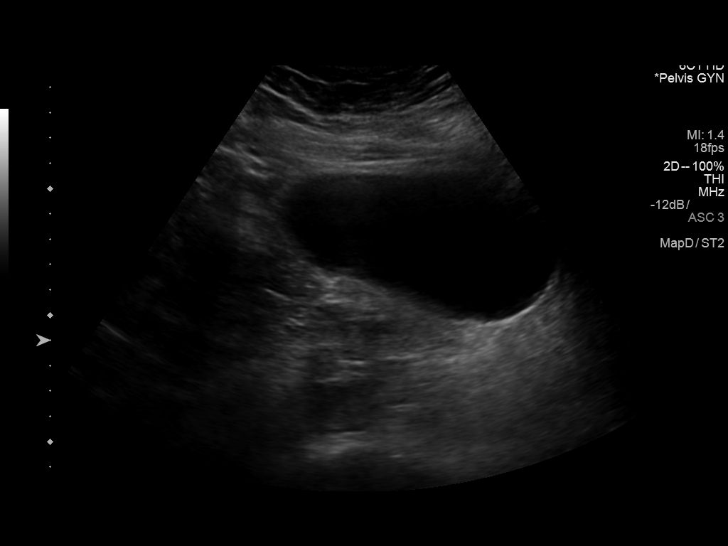
[im 41/98]
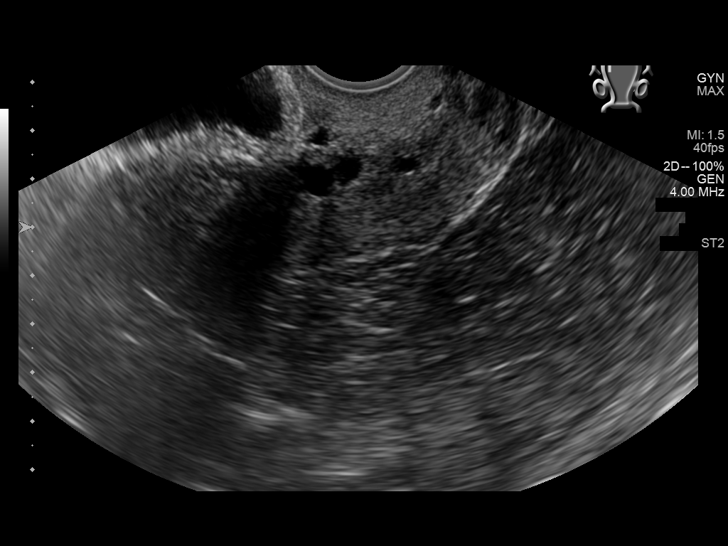
[im 49/98]
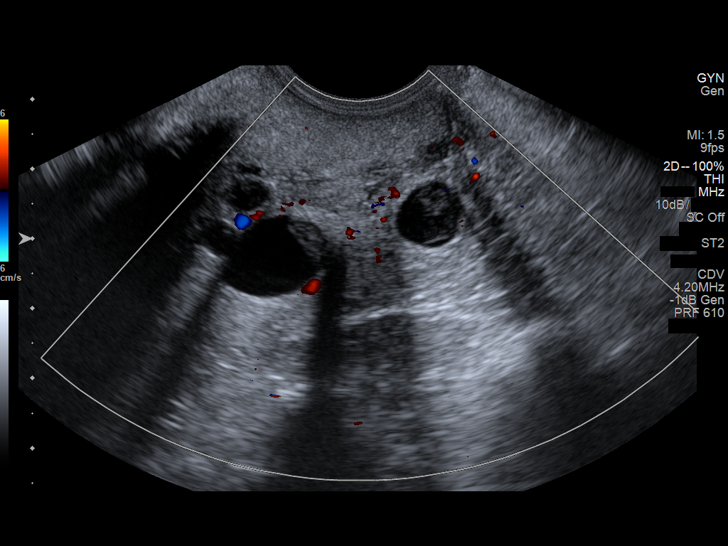
[im 57/98]
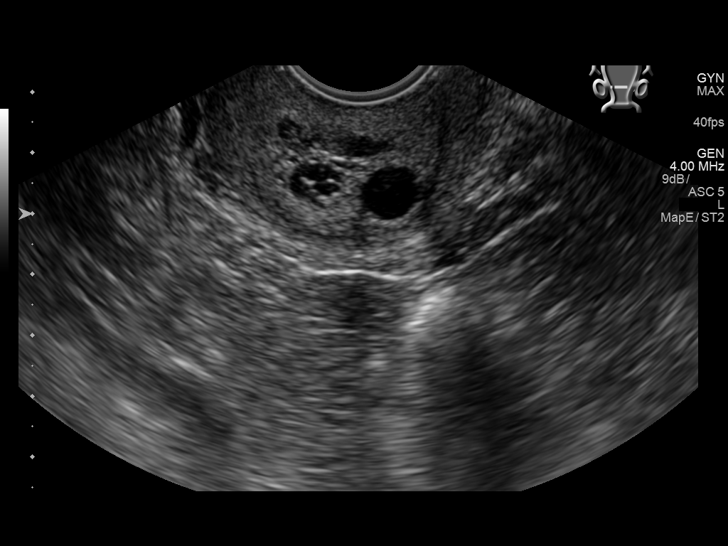
[im 65/98]
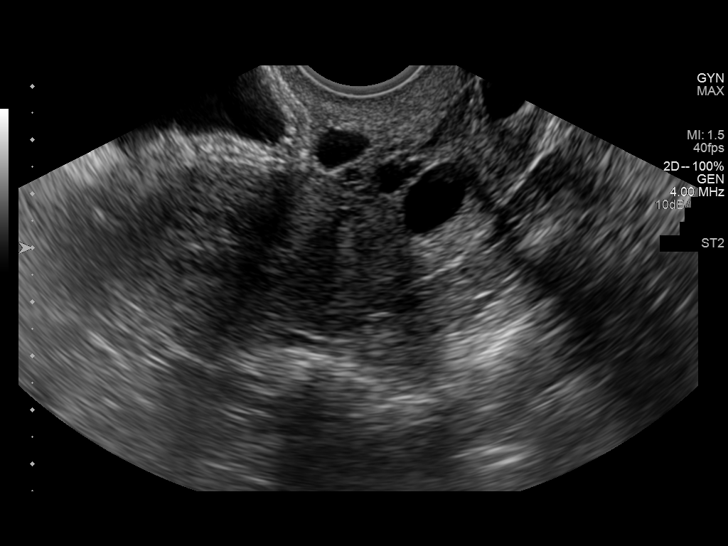
[im 73/98]
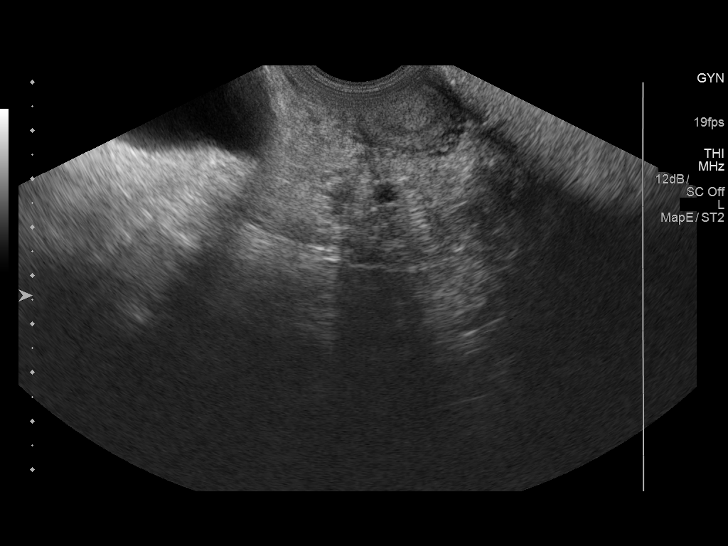
[im 81/98]
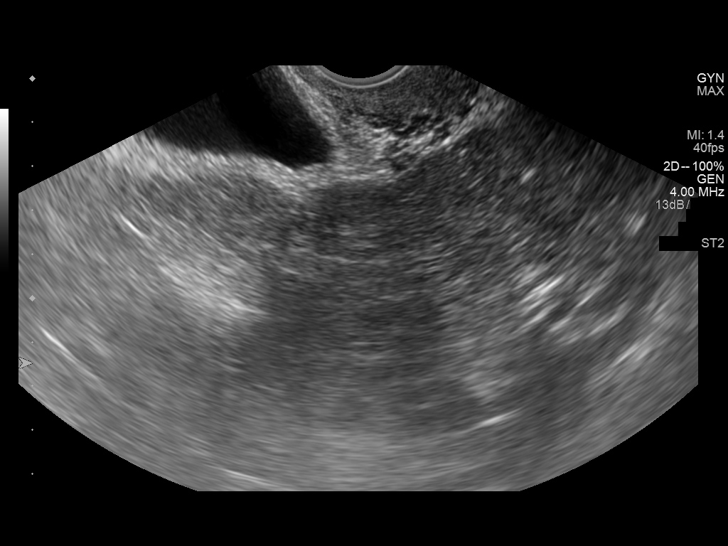
[im 89/98]
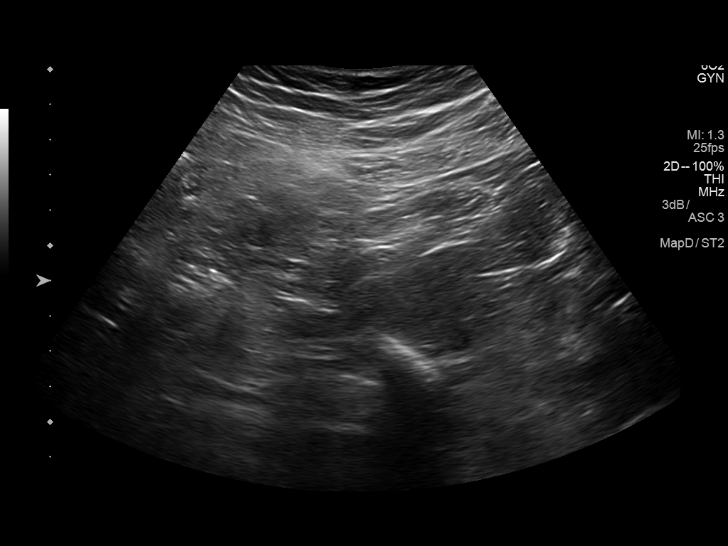
[im 98/98]
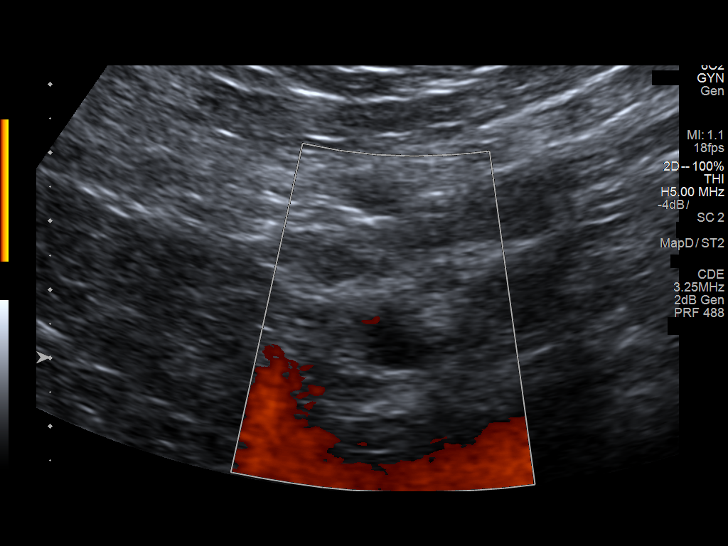

[13 of 25 positions shown; findings below may reference images not displayed]

FINDINGS: Uterus

Measurements: 9.3 x 3.2 x 4.4 cm. Multiple nabothian cysts, several
of which contain internal debris, the largest measures 1.6 x 1.1 x
1.7 cm.

Endometrium

Thickness: 5.8 mm.  No focal abnormality visualized.

Right ovary

Not visualized

Left ovary

Measurements: 1.8 x 1.4 x 1.4 cm.. 1.3 cm dominant follicle.

Other findings

No abnormal free fluid.
IMPRESSION: 1. Endometrial thickness of 5.8 mm. If bleeding remains unresponsive
to hormonal or medical therapy, sonohysterogram should be considered
for focal lesion work-up. (Ref: Radiological Reasoning: Algorithmic
Workup of Abnormal Vaginal Bleeding with Endovaginal Sonography and
Sonohysterography. [id number]; [id number])
2. Nonvisualization of the right ovary
3. Multiple nabothian cysts, some of which contain debris.

## 2018-07-16 ENCOUNTER — Telehealth: Payer: Self-pay | Admitting: Physician Assistant

## 2018-07-16 NOTE — Telephone Encounter (Signed)
Please Advise

## 2018-07-16 NOTE — Telephone Encounter (Signed)
Pt needing to know what is safer for her to take for her allergies due to her BP going up. Pt wants to make sure if Zyrtec is safe for her to take.  Please advise.  Thanks, [employer]

## 2018-07-16 NOTE — Telephone Encounter (Signed)
Patient advised.

## 2018-07-16 NOTE — Telephone Encounter (Signed)
Zyrtec can increase BP. May benefit from taking loratadine (claritin) or fexofenadine (allegra).

## 2018-07-17 ENCOUNTER — Encounter: Payer: Self-pay | Admitting: Physician Assistant

## 2018-07-17 ENCOUNTER — Ambulatory Visit: Payer: Managed Care, Other (non HMO) | Admitting: Physician Assistant

## 2018-07-17 VITALS — BP 126/80 | HR 94 | Temp 98.2°F | Resp 20 | Wt 249.0 lb

## 2018-07-17 DIAGNOSIS — J069 Acute upper respiratory infection, unspecified: Secondary | ICD-10-CM | POA: Diagnosis not present

## 2018-07-17 NOTE — Patient Instructions (Signed)
Take zyrtec, claritin, allegra OR xyzal. Do not take any allergy medications with "D" on the end of them. Can take flonase. DO not take afrin. Can take corcidin. No sudafed. Nothing with "pseudoephedrine and phenylephrine."

## 2018-07-17 NOTE — Progress Notes (Signed)
Patient: Karen Morrison Female    DOB: [birth year]   47 y.o.   MRN: [id number] Visit Date: 07/17/2018  Today's Provider: Trinna Post, PA-C   Chief Complaint  Patient presents with  . URI   Subjective:    HPI Upper Respiratory Infection: Patient complains of symptoms of a URI. Symptoms include congestion, plugged sensation in both ears and sore throat. Onset of symptoms was 2 days ago, gradually worsening since that time. She also c/o bilateral ear pressure/pain, congestion, facial pain, nasal congestion, post nasal drip, sinus pressure and sore throat for the past 2 days .  She is drinking plenty of fluids. Evaluation to date: none. Treatment to date: none.       Allergies  Allergen Reactions  . Hydrocodone-Acetaminophen Nausea And Vomiting  . Amoxicillin Hives, Itching and Rash  . Apple Cider Vinegar Rash  . Azithromycin Hives, Itching and Rash  . Doxycycline Rash  . Penicillins Hives, Itching and Rash    Has patient had a PCN reaction causing immediate rash, facial/tongue/throat swelling, SOB or lightheadedness with hypotension: Yes Has patient had a PCN reaction causing severe rash involving mucus membranes or skin necrosis: Yes Has patient had a PCN reaction that required hospitalization: No Has patient had a PCN reaction occurring within the last 10 years: No If all of the above answers are "NO", then may proceed with Cephalosporin use.      Current Outpatient Medications:  .  acetaminophen (TYLENOL) 325 MG tablet, Take 650 mg by mouth every 6 (six) hours as needed., Disp: , Rfl:  .  diphenhydrAMINE (BENADRYL) 25 MG tablet, Take 25 mg by mouth daily as needed for itching (hives)., Disp: , Rfl:  .  hydrochlorothiazide (HYDRODIURIL) 25 MG tablet, Take 1 tablet (25 mg total) by mouth daily., Disp: 90 tablet, Rfl: 1 .  ibuprofen (ADVIL,MOTRIN) 200 MG tablet, Take 200 mg by mouth daily as needed for headache or moderate pain., Disp: , Rfl:  .  meloxicam (MOBIC) 7.5  MG tablet, Take 1 tablet (7.5 mg total) by mouth daily., Disp: 90 tablet, Rfl: 1 .  Multiple Vitamins-Minerals (HAIR SKIN AND NAILS FORMULA) TABS, Take 1 tablet by mouth daily., Disp: , Rfl:   Review of Systems  Constitutional: Negative.   HENT: Positive for congestion, ear pain, postnasal drip, rhinorrhea, sinus pressure, sinus pain and sore throat.   Respiratory: Negative.     Social History   Tobacco Use  . Smoking status: Never Smoker  . Smokeless tobacco: Never Used  Substance Use Topics  . Alcohol use: No    Alcohol/week: 0.0 standard drinks   Objective:   BP 126/80 (BP Location: Left Arm, Patient Position: Sitting, Cuff Size: Normal)   Pulse 94   Temp 98.2 F (36.8 C) (Oral)   Resp 20   Wt 249 lb (112.9 kg)   LMP 03/16/2017 (Exact Date)   SpO2 99%   BMI 41.44 kg/m  Vitals:   07/17/18 0951  BP: 126/80  Pulse: 94  Resp: 20  Temp: 98.2 F (36.8 C)  TempSrc: Oral  SpO2: 99%  Weight: 249 lb (112.9 kg)     Physical Exam  Constitutional: She is oriented to person, place, and time. She appears well-developed and well-nourished.  HENT:  Right Ear: External ear normal.  Left Ear: External ear normal.  Mouth/Throat: Oropharynx is clear and moist. No oropharyngeal exudate.  Eyes: Right eye exhibits discharge. Left eye exhibits discharge.  Neck: Neck supple.  Cardiovascular:  Normal rate and regular rhythm.  Pulmonary/Chest: Effort normal and breath sounds normal. No respiratory distress. She has no rales.  Lymphadenopathy:    She has no cervical adenopathy.  Neurological: She is alert and oriented to person, place, and time.  Skin: Skin is warm and dry.  Psychiatric: She has a normal mood and affect. Her behavior is normal.        Assessment & Plan:     1. Viral URI  Counseled on 7-10 day course of viral URI. Counseled on appropriate medications to take with high BP. If not better within 10 days, will send in antibiotic.   Return if symptoms worsen or fail  to improve.  The entirety of the information documented in the History of Present Illness, Review of Systems and Physical Exam were personally obtained by me. Portions of this information were initially documented by Lynford Humphrey, CMA and reviewed by me for thoroughness and accuracy.           Trinna Post, PA-C  [facility]

## 2018-07-26 ENCOUNTER — Other Ambulatory Visit: Payer: Self-pay | Admitting: Physician Assistant

## 2018-07-26 DIAGNOSIS — M170 Bilateral primary osteoarthritis of knee: Secondary | ICD-10-CM

## 2018-09-06 ENCOUNTER — Other Ambulatory Visit: Payer: Self-pay | Admitting: Physician Assistant

## 2018-09-06 DIAGNOSIS — I10 Essential (primary) hypertension: Secondary | ICD-10-CM

## 2018-09-06 DIAGNOSIS — M170 Bilateral primary osteoarthritis of knee: Secondary | ICD-10-CM

## 2018-09-06 NOTE — Telephone Encounter (Signed)
Pt requesting refill of meloxicam (MOBIC) 7.5 MG tablet and hydrochlorothiazide (HYDRODIURIL) 25 MG tablet both sent to [facility] on [address] instead of mail order.

## 2018-09-07 MED ORDER — MELOXICAM 7.5 MG PO TABS: 7.5000 mg | ORAL_TABLET | Freq: Every day | ORAL | 1 refills | Status: DC

## 2018-09-07 MED ORDER — HYDROCHLOROTHIAZIDE 25 MG PO TABS: 25.0000 mg | ORAL_TABLET | Freq: Every day | ORAL | 1 refills | Status: DC

## 2018-10-22 ENCOUNTER — Other Ambulatory Visit: Payer: Self-pay | Admitting: Physician Assistant

## 2018-10-22 DIAGNOSIS — Z1231 Encounter for screening mammogram for malignant neoplasm of breast: Secondary | ICD-10-CM

## 2018-11-23 ENCOUNTER — Ambulatory Visit: Payer: Managed Care, Other (non HMO) | Admitting: Physician Assistant

## 2018-11-23 ENCOUNTER — Encounter: Payer: Self-pay | Admitting: Physician Assistant

## 2018-11-23 VITALS — BP 122/78 | HR 72 | Temp 98.2°F | Wt 241.0 lb

## 2018-11-23 DIAGNOSIS — R509 Fever, unspecified: Secondary | ICD-10-CM

## 2018-11-23 DIAGNOSIS — H66003 Acute suppurative otitis media without spontaneous rupture of ear drum, bilateral: Secondary | ICD-10-CM

## 2018-11-23 DIAGNOSIS — J029 Acute pharyngitis, unspecified: Secondary | ICD-10-CM | POA: Diagnosis not present

## 2018-11-23 LAB — POCT RAPID STREP A (OFFICE): RAPID STREP A SCREEN: NEGATIVE

## 2018-11-23 LAB — POCT INFLUENZA A/B
INFLUENZA A, POC: NEGATIVE
Influenza B, POC: NEGATIVE

## 2018-11-23 MED ORDER — CEFDINIR 300 MG PO CAPS: 300.0000 mg | ORAL_CAPSULE | Freq: Two times a day (BID) | ORAL | 0 refills | Status: DC

## 2018-11-23 NOTE — Progress Notes (Signed)
Patient: Karen Morrison Female    DOB: 12-26-1970   48 y.o.   MRN: [id number] Visit Date: 11/23/2018  Today's Provider: Mar Daring, PA-C   Chief Complaint  Patient presents with  . Sore Throat   Subjective:     Sore Throat   This is a new problem. The current episode started in the past 7 days (2 days). The problem has been gradually worsening. There has been no fever. Associated symptoms include ear pain, headaches, a hoarse voice, neck pain, swollen glands and trouble swallowing. Pertinent negatives include no abdominal pain, congestion, coughing, diarrhea, drooling, ear discharge, plugged ear sensation, shortness of breath, stridor or vomiting. She has had exposure to strep. She has had no exposure to mono. Treatments tried: Tylenol. The treatment provided mild relief.   Patient has had a sore throat and headache for 2 days. Patient states she was exposed to strep about 1 week ago. Patient has taken tylenol with mild relief.  Allergies  Allergen Reactions  . Hydrocodone-Acetaminophen Nausea And Vomiting  . Amoxicillin Hives, Itching and Rash  . Apple Cider Vinegar Rash  . Azithromycin Hives, Itching and Rash  . Doxycycline Rash  . Penicillins Hives, Itching and Rash    Has patient had a PCN reaction causing immediate rash, facial/tongue/throat swelling, SOB or lightheadedness with hypotension: Yes Has patient had a PCN reaction causing severe rash involving mucus membranes or skin necrosis: Yes Has patient had a PCN reaction that required hospitalization: No Has patient had a PCN reaction occurring within the last 10 years: No If all of the above answers are "NO", then may proceed with Cephalosporin use.      Current Outpatient Medications:  .  acetaminophen (TYLENOL) 325 MG tablet, Take 650 mg by mouth every 6 (six) hours as needed., Disp: , Rfl:  .  hydrochlorothiazide (HYDRODIURIL) 25 MG tablet, Take 1 tablet (25 mg total) by mouth daily., Disp: 90 tablet,  Rfl: 1 .  meloxicam (MOBIC) 7.5 MG tablet, Take 1 tablet (7.5 mg total) by mouth daily., Disp: 90 tablet, Rfl: 1 .  Multiple Vitamins-Minerals (HAIR SKIN AND NAILS FORMULA) TABS, Take 1 tablet by mouth daily., Disp: , Rfl:  .  cefdinir (OMNICEF) 300 MG capsule, Take 1 capsule (300 mg total) by mouth 2 (two) times daily., Disp: 14 capsule, Rfl: 0 .  diphenhydrAMINE (BENADRYL) 25 MG tablet, Take 25 mg by mouth daily as needed for itching (hives)., Disp: , Rfl:  .  ibuprofen (ADVIL,MOTRIN) 200 MG tablet, Take 200 mg by mouth daily as needed for headache or moderate pain., Disp: , Rfl:   Review of Systems  Constitutional: Positive for chills and fatigue. Negative for fever.  HENT: Positive for ear pain, hoarse voice, postnasal drip, sore throat and trouble swallowing. Negative for congestion, drooling, ear discharge, sinus pressure and sinus pain.   Respiratory: Negative for cough, shortness of breath and stridor.   Gastrointestinal: Positive for nausea. Negative for abdominal pain, diarrhea and vomiting.  Musculoskeletal: Positive for neck pain.  Neurological: Positive for headaches.    Social History   Tobacco Use  . Smoking status: Never Smoker  . Smokeless tobacco: Never Used  Substance Use Topics  . Alcohol use: No    Alcohol/week: 0.0 standard drinks      Objective:   BP 122/78 (BP Location: Left Arm, Patient Position: Sitting, Cuff Size: Large)   Pulse 72   Temp 98.2 F (36.8 C) (Oral)   Wt 241 lb (  109.3 kg)   LMP 03/16/2017 (Exact Date)   SpO2 97%   BMI 40.10 kg/m  Vitals:   11/23/18 1120  BP: 122/78  Pulse: 72  Temp: 98.2 F (36.8 C)  TempSrc: Oral  SpO2: 97%  Weight: 241 lb (109.3 kg)     Physical Exam Vitals signs reviewed.  Constitutional:      General: She is not in acute distress.    Appearance: She is well-developed. She is not diaphoretic.  HENT:     Head: Normocephalic and atraumatic.     Right Ear: Hearing, ear canal and external ear normal.  Swelling and tenderness present. A middle ear effusion (opaque) is present. Tympanic membrane is erythematous and bulging.     Left Ear: Hearing, ear canal and external ear normal. Tenderness present. A middle ear effusion (opaque) is present. Tympanic membrane is erythematous and bulging.     Nose: Nose normal.     Mouth/Throat:     Pharynx: Uvula midline. No oropharyngeal exudate.  Eyes:     General: No scleral icterus.       Right eye: No discharge.        Left eye: No discharge.     Conjunctiva/sclera: Conjunctivae normal.     Pupils: Pupils are equal, round, and reactive to light.  Neck:     Musculoskeletal: Normal range of motion and neck supple.     Thyroid: No thyromegaly.     Trachea: No tracheal deviation.  Cardiovascular:     Rate and Rhythm: Normal rate and regular rhythm.     Heart sounds: Normal heart sounds. No murmur. No friction rub. No gallop.   Pulmonary:     Effort: Pulmonary effort is normal. No respiratory distress.     Breath sounds: Normal breath sounds. No stridor. No wheezing or rales.  Lymphadenopathy:     Cervical: No cervical adenopathy.  Skin:    General: Skin is warm and dry.        Assessment & Plan    1. Non-recurrent acute suppurative otitis media of both ears without spontaneous rupture of tympanic membranes Worsening symptoms that have not responded to OTC medications. Will give Omnicef as below. Continue allergy medications. Stay well hydrated and get plenty of rest. Call if no symptom improvement or if symptoms worsen. - cefdinir (OMNICEF) 300 MG capsule; Take 1 capsule (300 mg total) by mouth 2 (two) times daily.  Dispense: 14 capsule; Refill: 0  2. Sore throat Strep negative. - POCT rapid strep A  3. Low grade fever Flu negative. - POCT Influenza A/B     Mar Daring, PA-C  [facility]

## 2018-11-23 NOTE — Patient Instructions (Signed)

## 2018-11-26 ENCOUNTER — Ambulatory Visit
Admission: RE | Admit: 2018-11-26 | Discharge: 2018-11-26 | Disposition: A | Discharge status: Home / Self Care | Payer: Managed Care, Other (non HMO) | Source: Ambulatory Visit | Attending: Physician Assistant | Admitting: Physician Assistant

## 2018-11-26 DIAGNOSIS — Z1231 Encounter for screening mammogram for malignant neoplasm of breast: Secondary | ICD-10-CM

## 2018-11-28 ENCOUNTER — Telehealth: Payer: Self-pay

## 2018-11-28 NOTE — Telephone Encounter (Signed)
-----   Message from Mar Daring, [place] sent at 11/27/2018  5:24 PM EST ----- Normal mammogram. Repeat screening in one year.

## 2018-11-28 NOTE — Telephone Encounter (Signed)
Patient advised as directed below. 

## 2019-01-10 ENCOUNTER — Other Ambulatory Visit: Payer: Self-pay

## 2019-01-10 DIAGNOSIS — M170 Bilateral primary osteoarthritis of knee: Secondary | ICD-10-CM

## 2019-01-11 MED ORDER — MELOXICAM 7.5 MG PO TABS: 7.5000 mg | ORAL_TABLET | Freq: Every day | ORAL | 1 refills | Status: DC

## 2019-02-08 ENCOUNTER — Encounter: Payer: Self-pay | Admitting: Physician Assistant

## 2019-03-29 NOTE — Progress Notes (Signed)
bmi.      Patient: Karen Morrison, Female    DOB: 1970/11/02, 48 y.o.   MRN: [id number] Visit Date: 04/01/2019  Today's Provider: Mar Daring, PA-C   Chief Complaint  Patient presents with  . Annual Exam   Subjective:     Annual physical exam Karen Morrison is a 48 y.o. female who presents today for health maintenance and complete physical. She feels well. She reports exercising yes. She reports she is sleeping well. -----------------------------------------------------------------   Review of Systems  Constitutional: Negative.   HENT: Negative.   Eyes: Negative.   Respiratory: Negative.   Cardiovascular: Negative.   Gastrointestinal: Negative.   Endocrine: Negative.   Genitourinary: Negative.   Musculoskeletal: Negative.   Skin: Negative.   Allergic/Immunologic: Negative.   Neurological: Negative.   Hematological: Negative.   Psychiatric/Behavioral: Negative.     Social History      She  reports that she has never smoked. She has never used smokeless tobacco. She reports that she does not drink alcohol or use drugs.       Social History   Socioeconomic History  . Marital status: Married    Spouse name: Edd Arbour  . Number of children: 2  . Years of education: HS  . Highest education level: Not on file  Occupational History  . Not on file  Social Needs  . Financial resource strain: Not on file  . Food insecurity    Worry: Not on file    Inability: Not on file  . Transportation needs    Medical: Not on file    Non-medical: Not on file  Tobacco Use  . Smoking status: Never Smoker  . Smokeless tobacco: Never Used  Substance and Sexual Activity  . Alcohol use: No    Alcohol/week: 0.0 standard drinks  . Drug use: No  . Sexual activity: Yes    Birth control/protection: Pill, None  Lifestyle  . Physical activity    Days per week: Not on file    Minutes per session: Not on file  . Stress: Not on file  Relationships  . Social Herbalist on  phone: Not on file    Gets together: Not on file    Attends religious service: Not on file    Active member of club or organization: Not on file    Attends meetings of clubs or organizations: Not on file    Relationship status: Not on file  Other Topics Concern  . Not on file  Social History Narrative  . Not on file    Past Medical History:  Diagnosis Date  . Allergic rhinitis   . Arthritis   . BMI 45.0-49.9, adult ([place]) 01/30/2017  . Hypercholesteremia   . Obesity   . PONV (postoperative nausea and vomiting)      Patient Active Problem List   Diagnosis Date Noted  . Menometrorrhagia 03/10/2017  . Morbid obesity, unspecified obesity type ([place]) 01/30/2017  . Allergic rhinitis 11/12/2015  . Calculus of gallbladder 11/12/2015  . Hypercholesteremia 11/12/2015  . Bone spur 11/12/2015  . H/O total knee replacement 12/21/2014    Past Surgical History:  Procedure Laterality Date  . CESAREAN SECTION  1994  . CHOLECYSTECTOMY  1999  . CYSTOSCOPY  04/04/2017   Procedure: CYSTOSCOPY;  Surgeon: Gae Dry, MD;  Location: [facility] ORS;  Service: Gynecology;;  . JOINT REPLACEMENT     left knee  . REPLACEMENT TOTAL KNEE  01/02/2013  . TOTAL LAPAROSCOPIC HYSTERECTOMY  WITH SALPINGECTOMY Bilateral 04/04/2017   Procedure: HYSTERECTOMY TOTAL LAPAROSCOPIC WITH SALPINGECTOMY;  Surgeon: Gae Dry, MD;  Location: [facility] ORS;  Service: Gynecology;  Laterality: Bilateral;  . VAGINAL BIRTH AFTER CESAREAN SECTION      Family History        Family Status  Relation Name Status  . Mother  Alive  . [employer]  . MGM  Alive  . MGF  Deceased  . PGF  Deceased  . PGM  Deceased  . Father  Alive  . Sister  Alive  . Daughter  Alive  . Son  Alive  . Sister  Alive        Her family history includes Alzheimer's disease in her maternal grandmother; Breast cancer in her paternal aunt; Diabetes in her mother; Emphysema in her paternal grandfather; Healthy in her father; Heart attack in her  maternal grandfather.      Allergies  Allergen Reactions  . Hydrocodone-Acetaminophen Nausea And Vomiting  . Amoxicillin Hives, Itching and Rash  . Apple Cider Vinegar Rash  . Azithromycin Hives, Itching and Rash  . Doxycycline Rash  . Penicillins Hives, Itching and Rash    Has patient had a PCN reaction causing immediate rash, facial/tongue/throat swelling, SOB or lightheadedness with hypotension: Yes Has patient had a PCN reaction causing severe rash involving mucus membranes or skin necrosis: Yes Has patient had a PCN reaction that required hospitalization: No Has patient had a PCN reaction occurring within the last 10 years: No If all of the above answers are "NO", then may proceed with Cephalosporin use.      Current Outpatient Medications:  .  acetaminophen (TYLENOL) 325 MG tablet, Take 650 mg by mouth every 6 (six) hours as needed., Disp: , Rfl:  .  diphenhydrAMINE (BENADRYL) 25 MG tablet, Take 25 mg by mouth daily as needed for itching (hives)., Disp: , Rfl:  .  hydrochlorothiazide (HYDRODIURIL) 25 MG tablet, Take 1 tablet (25 mg total) by mouth daily., Disp: 90 tablet, Rfl: 1 .  ibuprofen (ADVIL,MOTRIN) 200 MG tablet, Take 200 mg by mouth daily as needed for headache or moderate pain., Disp: , Rfl:  .  meloxicam (MOBIC) 7.5 MG tablet, Take 1 tablet (7.5 mg total) by mouth daily., Disp: 90 tablet, Rfl: 1 .  Multiple Vitamins-Minerals (HAIR SKIN AND NAILS FORMULA) TABS, Take 1 tablet by mouth daily., Disp: , Rfl:  .  cefdinir (OMNICEF) 300 MG capsule, Take 1 capsule (300 mg total) by mouth 2 (two) times daily. (Patient not taking: Reported on 04/01/2019), Disp: 14 capsule, Rfl: 0   Patient Care Team: Mar Daring, PA-C as PCP - General (Family Medicine)    Objective:    Vitals: BP 120/82 (BP Location: Left Arm, Patient Position: Sitting, Cuff Size: Large)   Pulse 75   Temp 98.1 F (36.7 C) (Oral)   Resp 16   Ht 5\' 5"  (1.651 m)   Wt 250 lb (113.4 kg)   LMP  03/16/2017 (Exact Date)   SpO2 99%   BMI 41.60 kg/m    Vitals:   04/01/19 0925  BP: 120/82  Pulse: 75  Resp: 16  Temp: 98.1 F (36.7 C)  TempSrc: Oral  SpO2: 99%  Weight: 250 lb (113.4 kg)  Height: 5\' 5"  (1.651 m)     Physical Exam Vitals signs reviewed.  Constitutional:      General: She is not in acute distress.    Appearance: Normal appearance. She is well-developed. She is obese. She  is not ill-appearing or diaphoretic.  HENT:     Head: Normocephalic and atraumatic.     Right Ear: Tympanic membrane, ear canal and external ear normal.     Left Ear: Tympanic membrane, ear canal and external ear normal.     Nose: Nose normal.     Mouth/Throat:     Mouth: Mucous membranes are moist.     Pharynx: No oropharyngeal exudate.  Eyes:     General: No scleral icterus.       Right eye: No discharge.        Left eye: No discharge.     Extraocular Movements: Extraocular movements intact.     Conjunctiva/sclera: Conjunctivae normal.     Pupils: Pupils are equal, round, and reactive to light.  Neck:     Musculoskeletal: Normal range of motion and neck supple.     Thyroid: No thyromegaly.     Vascular: No JVD.     Trachea: No tracheal deviation.  Cardiovascular:     Rate and Rhythm: Normal rate and regular rhythm.     Pulses: Normal pulses.     Heart sounds: Normal heart sounds. No murmur. No friction rub. No gallop.   Pulmonary:     Effort: Pulmonary effort is normal. No respiratory distress.     Breath sounds: Normal breath sounds. No wheezing or rales.  Chest:     Chest wall: No tenderness.     Breasts:        Right: Normal.        Left: Normal.  Abdominal:     General: Bowel sounds are normal. There is no distension.     Palpations: Abdomen is soft. There is no mass.     Tenderness: There is no abdominal tenderness. There is no guarding or rebound.  Musculoskeletal: Normal range of motion.        General: No tenderness.  Lymphadenopathy:     Cervical: No cervical  adenopathy.  Skin:    General: Skin is warm and dry.     Capillary Refill: Capillary refill takes less than 2 seconds.     Findings: No rash.  Neurological:     General: No focal deficit present.     Mental Status: She is alert and oriented to person, place, and time. Mental status is at baseline.     Cranial Nerves: No cranial nerve deficit.     Motor: No weakness.     Gait: Gait normal.  Psychiatric:        Mood and Affect: Mood normal.        Behavior: Behavior normal.        Thought Content: Thought content normal.        Judgment: Judgment normal.      Depression Screen PHQ 2/9 Scores 04/01/2019 02/02/2018 01/30/2017 01/29/2016  PHQ - 2 Score 2 0 0 0  PHQ- 9 Score 4 - 0 -       Assessment & Plan:     Routine Health Maintenance and Physical Exam  Exercise Activities and Dietary recommendations Goals   None     Immunization History  Administered Date(s) Administered  . Influenza Inj Mdck Quad Pf 08/04/2017  . Influenza Split 07/06/2012, 08/26/2013  . Td 07/19/2005  . Tdap 01/23/2015    Health Maintenance  Topic Date Due  . PAP SMEAR-Modifier  01/29/2019  . INFLUENZA VACCINE  05/18/2019  . TETANUS/TDAP  01/22/2025  . HIV Screening  Completed     Discussed health benefits  of physical activity, and encouraged her to engage in regular exercise appropriate for her age and condition.   1. Annual physical exam Normal physical exam today. Will check labs as below and f/u pending lab results. If labs are stable and WNL she will not need to have these rechecked for one year at her next annual physical exam. She is to call the office in the meantime if she has any acute issue, questions or concerns. - CBC w/Diff/Platelet - Comprehensive Metabolic Panel (CMET) - TSH - Lipid Profile - HgB A1c  2. Colon cancer screening No previous colonoscopy. No family history of colon cancer. Does have family history of colon polyps in father and sister.  - Ambulatory referral  to Gastroenterology  3. Class 1 obesity due to excess calories with serious comorbidity and body mass index (BMI) of 31.0 to 31.9 in adult Counseled patient on healthy lifestyle modifications including dieting and exercise.  - Comprehensive Metabolic Panel (CMET) - Lipid Profile - HgB A1c  4. Essential hypertension Stable. Diagnosis pulled for medication refill. Continue current medical treatment plan. Will check labs as below and f/u pending results. - Comprehensive Metabolic Panel (CMET) - Lipid Profile - hydrochlorothiazide (HYDRODIURIL) 25 MG tablet; Take 1 tablet (25 mg total) by mouth daily.  Dispense: 90 tablet; Refill: 1  5. Family history of diabetes mellitus (DM) Will check labs as below and f/u pending results. - HgB A1c   --------------------------------------------------------------------    Mar Daring, PA-C  [facility]

## 2019-04-01 ENCOUNTER — Encounter: Payer: Self-pay | Admitting: Physician Assistant

## 2019-04-01 ENCOUNTER — Ambulatory Visit (INDEPENDENT_AMBULATORY_CARE_PROVIDER_SITE_OTHER): Payer: Managed Care, Other (non HMO) | Admitting: Physician Assistant

## 2019-04-01 ENCOUNTER — Other Ambulatory Visit: Payer: Self-pay

## 2019-04-01 VITALS — BP 120/82 | HR 75 | Temp 98.1°F | Resp 16 | Ht 65.0 in | Wt 250.0 lb

## 2019-04-01 DIAGNOSIS — I10 Essential (primary) hypertension: Secondary | ICD-10-CM | POA: Diagnosis not present

## 2019-04-01 DIAGNOSIS — Z6831 Body mass index (BMI) 31.0-31.9, adult: Secondary | ICD-10-CM

## 2019-04-01 DIAGNOSIS — Z1211 Encounter for screening for malignant neoplasm of colon: Secondary | ICD-10-CM | POA: Diagnosis not present

## 2019-04-01 DIAGNOSIS — Z0000 Encounter for general adult medical examination without abnormal findings: Secondary | ICD-10-CM | POA: Diagnosis not present

## 2019-04-01 DIAGNOSIS — E6609 Other obesity due to excess calories: Secondary | ICD-10-CM

## 2019-04-01 DIAGNOSIS — Z833 Family history of diabetes mellitus: Secondary | ICD-10-CM

## 2019-04-01 MED ORDER — HYDROCHLOROTHIAZIDE 25 MG PO TABS: 25.0000 mg | ORAL_TABLET | Freq: Every day | ORAL | 1 refills | Status: DC

## 2019-04-01 NOTE — Patient Instructions (Signed)

## 2019-04-02 LAB — CBC WITH DIFFERENTIAL/PLATELET
Basophils Absolute: 0.1 10*3/uL (ref 0.0–0.2)
Basos: 1 %
EOS (ABSOLUTE): 0.1 10*3/uL (ref 0.0–0.4)
Eos: 1 %
Hematocrit: 41.9 % (ref 34.0–46.6)
Hemoglobin: 14.5 g/dL (ref 11.1–15.9)
Immature Grans (Abs): 0.1 10*3/uL (ref 0.0–0.1)
Immature Granulocytes: 1 %
Lymphocytes Absolute: 2.5 10*3/uL (ref 0.7–3.1)
Lymphs: 29 %
MCH: 29.4 pg (ref 26.6–33.0)
MCHC: 34.6 g/dL (ref 31.5–35.7)
MCV: 85 fL (ref 79–97)
Monocytes Absolute: 0.6 10*3/uL (ref 0.1–0.9)
Monocytes: 7 %
Neutrophils Absolute: 5.5 10*3/uL (ref 1.4–7.0)
Neutrophils: 61 %
Platelets: 264 10*3/uL (ref 150–450)
RBC: 4.93 x10E6/uL (ref 3.77–5.28)
RDW: 12.9 % (ref 11.7–15.4)
WBC: 8.8 10*3/uL (ref 3.4–10.8)

## 2019-04-02 LAB — HEMOGLOBIN A1C
Est. average glucose Bld gHb Est-mCnc: 105 mg/dL
Hgb A1c MFr Bld: 5.3 % (ref 4.8–5.6)

## 2019-04-02 LAB — COMPREHENSIVE METABOLIC PANEL
ALT: 12 IU/L (ref 0–32)
AST: 13 IU/L (ref 0–40)
Albumin/Globulin Ratio: 2 (ref 1.2–2.2)
Albumin: 4.3 g/dL (ref 3.8–4.8)
Alkaline Phosphatase: 93 IU/L (ref 39–117)
BUN/Creatinine Ratio: 16 (ref 9–23)
BUN: 10 mg/dL (ref 6–24)
Bilirubin Total: 0.4 mg/dL (ref 0.0–1.2)
CO2: 26 mmol/L (ref 20–29)
Calcium: 9.5 mg/dL (ref 8.7–10.2)
Chloride: 99 mmol/L (ref 96–106)
Creatinine, Ser: 0.64 mg/dL (ref 0.57–1.00)
GFR calc Af Amer: 121 mL/min/{1.73_m2} (ref >59)
GFR calc non Af Amer: 105 mL/min/{1.73_m2} (ref >59)
Globulin, Total: 2.1 g/dL (ref 1.5–4.5)
Glucose: 86 mg/dL (ref 65–99)
Potassium: 3.9 mmol/L (ref 3.5–5.2)
Sodium: 139 mmol/L (ref 134–144)
Total Protein: 6.4 g/dL (ref 6.0–8.5)

## 2019-04-02 LAB — TSH: TSH: 3.48 u[IU]/mL (ref 0.450–4.500)

## 2019-04-02 LAB — LIPID PANEL
Chol/HDL Ratio: 3.9 ratio (ref 0.0–4.4)
Cholesterol, Total: 247 mg/dL — ABNORMAL HIGH (ref 100–199)
HDL: 63 mg/dL (ref >39)
LDL Calculated: 162 mg/dL — ABNORMAL HIGH (ref 0–99)
Triglycerides: 109 mg/dL (ref 0–149)
VLDL Cholesterol Cal: 22 mg/dL (ref 5–40)

## 2019-04-04 ENCOUNTER — Other Ambulatory Visit: Payer: Self-pay

## 2019-04-04 ENCOUNTER — Telehealth: Payer: Self-pay

## 2019-04-04 DIAGNOSIS — Z01812 Encounter for preprocedural laboratory examination: Secondary | ICD-10-CM

## 2019-04-04 DIAGNOSIS — Z1211 Encounter for screening for malignant neoplasm of colon: Secondary | ICD-10-CM

## 2019-04-04 NOTE — Telephone Encounter (Signed)
Gastroenterology Pre-Procedure Review  Request Date: 05/23/19 Requesting Physician: Dr. Vicente Males  PATIENT REVIEW QUESTIONS: The patient responded to the following health history questions as indicated:    1. Are you having any GI issues? No 2. Do you have a personal history of Polyps? No 3. Do you have a family history of Colon Cancer or Polyps?Mother and Sister Polyps 4. Diabetes Mellitus? No 5. Joint replacements in the past 12 months?No 6. Major health problems in the past 3 months?no 7. Any artificial heart valves, MVP, or defibrillator?no    MEDICATIONS & ALLERGIES:    Patient reports the following regarding taking any anticoagulation/antiplatelet therapy:   Plavix, Coumadin, Eliquis, Xarelto, Lovenox, Pradaxa, Brilinta, or Effient? no Aspirin? no  Patient confirms/reports the following medications:  Current Outpatient Medications  Medication Sig Dispense Refill  . acetaminophen (TYLENOL) 325 MG tablet Take 650 mg by mouth every 6 (six) hours as needed.    . diphenhydrAMINE (BENADRYL) 25 MG tablet Take 25 mg by mouth daily as needed for itching (hives).    . hydrochlorothiazide (HYDRODIURIL) 25 MG tablet Take 1 tablet (25 mg total) by mouth daily. 90 tablet 1  . ibuprofen (ADVIL,MOTRIN) 200 MG tablet Take 200 mg by mouth daily as needed for headache or moderate pain.    . meloxicam (MOBIC) 7.5 MG tablet Take 1 tablet (7.5 mg total) by mouth daily. 90 tablet 1  . Multiple Vitamins-Minerals (HAIR SKIN AND NAILS FORMULA) TABS Take 1 tablet by mouth daily.     No current facility-administered medications for this visit.     Patient confirms/reports the following allergies:  Allergies  Allergen Reactions  . Hydrocodone-Acetaminophen Nausea And Vomiting  . Amoxicillin Hives, Itching and Rash  . Apple Cider Vinegar Rash  . Azithromycin Hives, Itching and Rash  . Doxycycline Rash  . Penicillins Hives, Itching and Rash    Has patient had a PCN reaction causing immediate rash,  facial/tongue/throat swelling, SOB or lightheadedness with hypotension: Yes Has patient had a PCN reaction causing severe rash involving mucus membranes or skin necrosis: Yes Has patient had a PCN reaction that required hospitalization: No Has patient had a PCN reaction occurring within the last 10 years: No If all of the above answers are "NO", then may proceed with Cephalosporin use.     No orders of the defined types were placed in this encounter.   AUTHORIZATION INFORMATION Primary Insurance: 1D#: Group #:  Secondary Insurance: 1D#: Group #:  SCHEDULE INFORMATION: Date: 08/06 Time: Location:[facility]

## 2019-05-15 ENCOUNTER — Telehealth: Payer: Self-pay

## 2019-05-15 ENCOUNTER — Other Ambulatory Visit: Payer: Self-pay

## 2019-05-15 DIAGNOSIS — Z1211 Encounter for screening for malignant neoplasm of colon: Secondary | ICD-10-CM

## 2019-05-15 NOTE — Telephone Encounter (Signed)
Spoke with pt and informed her that due to Dr. Georgeann Oppenheim schedule change we would need to schedule her 05-23-19 procedure at [facility] instead of [facility]. Pt agrees to this location change. I have sent updated prep instructions.

## 2019-05-16 ENCOUNTER — Other Ambulatory Visit: Payer: Self-pay

## 2019-05-20 ENCOUNTER — Other Ambulatory Visit
Admission: RE | Admit: 2019-05-20 | Discharge: 2019-05-20 | Disposition: A | Discharge status: Home / Self Care | Payer: Managed Care, Other (non HMO) | Source: Ambulatory Visit | Attending: Gastroenterology | Admitting: Gastroenterology

## 2019-05-20 ENCOUNTER — Other Ambulatory Visit: Payer: Self-pay

## 2019-05-20 DIAGNOSIS — Z01812 Encounter for preprocedural laboratory examination: Secondary | ICD-10-CM | POA: Diagnosis not present

## 2019-05-20 DIAGNOSIS — Z20828 Contact with and (suspected) exposure to other viral communicable diseases: Secondary | ICD-10-CM | POA: Insufficient documentation

## 2019-05-20 LAB — SARS CORONAVIRUS 2 (TAT 6-24 HRS): SARS Coronavirus 2: NEGATIVE

## 2019-05-22 NOTE — Discharge Instructions (Signed)

## 2019-05-23 ENCOUNTER — Encounter
Admission: RE | Disposition: A | Discharge status: Home / Self Care | Payer: Self-pay | Source: Home / Self Care | Attending: Gastroenterology

## 2019-05-23 ENCOUNTER — Ambulatory Visit
Admission: RE | Admit: 2019-05-23 | Discharge: 2019-05-23 | Disposition: A | Discharge status: Home / Self Care | Payer: Managed Care, Other (non HMO) | Attending: Gastroenterology | Admitting: Gastroenterology

## 2019-05-23 ENCOUNTER — Other Ambulatory Visit: Payer: Self-pay

## 2019-05-23 ENCOUNTER — Ambulatory Visit: Admit: 2019-05-23 | Payer: Managed Care, Other (non HMO) | Admitting: Gastroenterology

## 2019-05-23 ENCOUNTER — Ambulatory Visit: Payer: Managed Care, Other (non HMO) | Admitting: Anesthesiology

## 2019-05-23 DIAGNOSIS — Z96652 Presence of left artificial knee joint: Secondary | ICD-10-CM | POA: Diagnosis not present

## 2019-05-23 DIAGNOSIS — Z1211 Encounter for screening for malignant neoplasm of colon: Secondary | ICD-10-CM | POA: Diagnosis not present

## 2019-05-23 DIAGNOSIS — K5140 Inflammatory polyps of colon without complications: Secondary | ICD-10-CM | POA: Diagnosis not present

## 2019-05-23 DIAGNOSIS — Z791 Long term (current) use of non-steroidal anti-inflammatories (NSAID): Secondary | ICD-10-CM | POA: Insufficient documentation

## 2019-05-23 DIAGNOSIS — K635 Polyp of colon: Secondary | ICD-10-CM | POA: Insufficient documentation

## 2019-05-23 DIAGNOSIS — I10 Essential (primary) hypertension: Secondary | ICD-10-CM | POA: Diagnosis not present

## 2019-05-23 DIAGNOSIS — Z8371 Family history of colonic polyps: Secondary | ICD-10-CM | POA: Diagnosis not present

## 2019-05-23 DIAGNOSIS — D122 Benign neoplasm of ascending colon: Secondary | ICD-10-CM | POA: Diagnosis not present

## 2019-05-23 HISTORY — PX: POLYPECTOMY: SHX5525

## 2019-05-23 HISTORY — DX: Motion sickness, initial encounter: T75.3XXA

## 2019-05-23 HISTORY — DX: Essential (primary) hypertension: I10

## 2019-05-23 HISTORY — PX: COLONOSCOPY WITH PROPOFOL: SHX5780

## 2019-05-23 HISTORY — DX: Presence of dental prosthetic device (complete) (partial): Z97.2

## 2019-05-23 SURGERY — COLONOSCOPY WITH PROPOFOL
Anesthesia: General | Site: Rectum

## 2019-05-23 SURGERY — COLONOSCOPY WITH PROPOFOL
Anesthesia: General

## 2019-05-23 MED ORDER — LIDOCAINE HCL (CARDIAC) PF 100 MG/5ML IV SOSY
PREFILLED_SYRINGE | INTRAVENOUS | Status: DC | PRN
  Administered 2019-05-23: 30 mg via INTRAVENOUS

## 2019-05-23 MED ORDER — SODIUM CHLORIDE 0.9 % IV SOLN: INTRAVENOUS | Status: DC

## 2019-05-23 MED ORDER — ONDANSETRON HCL 4 MG/2ML IJ SOLN
INTRAMUSCULAR | Status: DC | PRN
  Administered 2019-05-23: 4 mg via INTRAVENOUS

## 2019-05-23 MED ORDER — FENTANYL CITRATE (PF) 100 MCG/2ML IJ SOLN: 25.0000 ug | INTRAMUSCULAR | Status: DC | PRN

## 2019-05-23 MED ORDER — OXYCODONE HCL 5 MG PO TABS: 5.0000 mg | ORAL_TABLET | Freq: Once | ORAL | Status: DC | PRN

## 2019-05-23 MED ORDER — STERILE WATER FOR IRRIGATION IR SOLN
Status: DC | PRN
  Administered 2019-05-23: 30 mL

## 2019-05-23 MED ORDER — LACTATED RINGERS IV SOLN
INTRAVENOUS | Status: DC
  Administered 2019-05-23: 08:00:00 via INTRAVENOUS

## 2019-05-23 MED ORDER — PROMETHAZINE HCL 25 MG/ML IJ SOLN: 6.2500 mg | INTRAMUSCULAR | Status: DC | PRN

## 2019-05-23 MED ORDER — OXYCODONE HCL 5 MG/5ML PO SOLN: 5.0000 mg | Freq: Once | ORAL | Status: DC | PRN

## 2019-05-23 MED ORDER — MEPERIDINE HCL 25 MG/ML IJ SOLN: 6.2500 mg | INTRAMUSCULAR | Status: DC | PRN

## 2019-05-23 MED ORDER — LACTATED RINGERS IV SOLN: 10.0000 mL/h | INTRAVENOUS | Status: DC

## 2019-05-23 MED ORDER — ONDANSETRON HCL 4 MG/2ML IJ SOLN: INTRAMUSCULAR | Status: DC | PRN

## 2019-05-23 MED ORDER — PROPOFOL 10 MG/ML IV BOLUS
INTRAVENOUS | Status: DC | PRN
  Administered 2019-05-23: 50 mg via INTRAVENOUS
  Administered 2019-05-23: 30 mg via INTRAVENOUS
  Administered 2019-05-23: 20 mg via INTRAVENOUS
  Administered 2019-05-23: 50 mg via INTRAVENOUS
  Administered 2019-05-23 (×2): 20 mg via INTRAVENOUS
  Administered 2019-05-23: 50 mg via INTRAVENOUS
  Administered 2019-05-23: 20 mg via INTRAVENOUS
  Administered 2019-05-23 (×2): 30 mg via INTRAVENOUS
  Administered 2019-05-23: 50 mg via INTRAVENOUS

## 2019-05-23 SURGICAL SUPPLY — 24 items
CANISTER SUCT 1200ML W/VALVE (MISCELLANEOUS) ×4 IMPLANT
CLIP HMST 235XBRD CATH ROT (MISCELLANEOUS) IMPLANT
CLIP RESOLUTION 360 11X235 (MISCELLANEOUS)
ELECT REM PT RETURN 9FT ADLT (ELECTROSURGICAL)
ELECTRODE REM PT RTRN 9FT ADLT (ELECTROSURGICAL) IMPLANT
FCP ESCP3.2XJMB 240X2.8X (MISCELLANEOUS)
FORCEPS BIOP RAD 4 LRG CAP 4 (CUTTING FORCEPS) IMPLANT
FORCEPS BIOP RJ4 240 W/NDL (MISCELLANEOUS)
FORCEPS ESCP3.2XJMB 240X2.8X (MISCELLANEOUS) IMPLANT
GOWN CVR UNV OPN BCK APRN NK (MISCELLANEOUS) ×4 IMPLANT
GOWN ISOL THUMB LOOP REG UNIV (MISCELLANEOUS) ×8
INJECTOR VARIJECT VIN23 (MISCELLANEOUS) IMPLANT
KIT DEFENDO VALVE AND CONN (KITS) IMPLANT
KIT ENDO PROCEDURE OLY (KITS) ×4 IMPLANT
MARKER SPOT ENDO TATTOO 5ML (MISCELLANEOUS) IMPLANT
PROBE APC STR FIRE (PROBE) IMPLANT
RETRIEVER NET ROTH 2.5X230 LF (MISCELLANEOUS) IMPLANT
SNARE SHORT THROW 13M SML OVAL (MISCELLANEOUS) ×2 IMPLANT
SNARE SHORT THROW 30M LRG OVAL (MISCELLANEOUS) IMPLANT
SNARE SNG USE RND 15MM (INSTRUMENTS) IMPLANT
SPOT EX ENDOSCOPIC TATTOO (MISCELLANEOUS)
TRAP ETRAP POLY (MISCELLANEOUS) ×2 IMPLANT
VARIJECT INJECTOR VIN23 (MISCELLANEOUS)
WATER STERILE IRR 250ML POUR (IV SOLUTION) ×4 IMPLANT

## 2019-05-23 NOTE — Op Note (Signed)
[facility] Patient Name: Karen Morrison Procedure Date: 05/23/2019 8:43 AM MRN: [id number] Account #: [id number] Date of Birth: 1971/10/01 Admit Type: Outpatient Age: 48 Room: [facility] OR ROOM 01 Gender: Female Note Status: Finalized Procedure:            Colonoscopy Indications:          Colon cancer screening in patient at increased risk:                        Family history of 1st-degree relative with colon polyps Providers:            Jonathon Bellows MD, MD Referring MD:         Mar Daring (Referring MD) Medicines:            Monitored Anesthesia Care Complications:        No immediate complications. Procedure:            Pre-Anesthesia Assessment:                       - Prior to the procedure, a History and Physical was                        performed, and patient medications, allergies and                        sensitivities were reviewed. The patient's tolerance of                        previous anesthesia was reviewed.                       - ASA Grade Assessment: II - A patient with mild                        systemic disease.                       After obtaining informed consent, the colonoscope was                        passed under direct vision. Throughout the procedure,                        the patient's blood pressure, pulse, and oxygen                        saturations were monitored continuously. The was                        introduced through the anus and advanced to the the                        cecum, identified by the appendiceal orifice. The                        colonoscopy was performed with ease. The patient                        tolerated the procedure well. The quality of the bowel  preparation was excellent. Findings:      The perianal and digital rectal examinations were normal.      Two sessile polyps were found in the sigmoid colon. The polyps were 4 to       6 mm in size. These  polyps were removed with a cold snare. Resection was       complete, but the polyp tissue was only partially retrieved.      A 5 mm polyp was found in the ascending colon. The polyp was sessile.       The polyp was removed with a cold snare. Resection and retrieval were       complete.      The exam was otherwise without abnormality on direct and retroflexion       views. Impression:           - Two 4 to 6 mm polyps in the sigmoid colon, removed                        with a cold snare. Resected and retrieved.                       - One 5 mm polyp in the ascending colon, removed with a                        cold snare. Resected and retrieved.                       - The examination was otherwise normal on direct and                        retroflexion views. Recommendation:       - Discharge patient to home (with escort).                       - Resume previous diet.                       - Continue present medications.                       - Await pathology results.                       - Repeat colonoscopy for surveillance based on                        pathology results. Procedure Code(s):    --- Professional ---                       [phone], Colonoscopy, flexible; with removal of tumor(s),                        polyp(s), or other lesion(s) by snare technique Diagnosis Code(s):    --- Professional ---                       K63.5, Polyp of colon                       Z83.71, Family history of colonic polyps CPT copyright 2019 American Medical Association. All rights reserved. The codes documented in this report are  preliminary and upon coder review may  be revised to meet current compliance requirements. Jonathon Bellows, MD Jonathon Bellows MD, MD 05/23/2019 9:14:51 AM This report has been signed electronically. Number of Addenda: 0 Note Initiated On: 05/23/2019 8:43 AM Scope Withdrawal Time: 0 hours 12 minutes 30 seconds  Total Procedure Duration: 0 hours 18 minutes 7 seconds  Estimated  Blood Loss: Estimated blood loss: none.      [facility]

## 2019-05-23 NOTE — Transfer of Care (Signed)
Immediate Anesthesia Transfer of Care Note  Patient: Karen Morrison  Procedure(s) Performed: COLONOSCOPY WITH PROPOFOL (N/A Rectum) POLYPECTOMY (Rectum)  Patient Location: PACU  Anesthesia Type: General  Level of Consciousness: awake, alert  and patient cooperative  Airway and Oxygen Therapy: Patient Spontanous Breathing and Patient connected to supplemental oxygen  Post-op Assessment: Post-op Vital signs reviewed, Patient's Cardiovascular Status Stable, Respiratory Function Stable, Patent Airway and No signs of Nausea or vomiting  Post-op Vital Signs: Reviewed and stable  Complications: No apparent anesthesia complications

## 2019-05-23 NOTE — Anesthesia Procedure Notes (Signed)
Procedure Name: MAC Date/Time: 05/23/2019 9:05 AM Performed by: Georga Bora, CRNA Pre-anesthesia Checklist: Patient identified, Emergency Drugs available, Suction available, Patient being monitored and Timeout performed Oxygen Delivery Method: Nasal cannula

## 2019-05-23 NOTE — Anesthesia Postprocedure Evaluation (Signed)
Anesthesia Post Note  Patient: Karen Morrison  Procedure(s) Performed: COLONOSCOPY WITH PROPOFOL (N/A Rectum) POLYPECTOMY (Rectum)  Patient location during evaluation: PACU Anesthesia Type: General Level of consciousness: awake and alert Pain management: pain level controlled Vital Signs Assessment: post-procedure vital signs reviewed and stable Respiratory status: spontaneous breathing, nonlabored ventilation, respiratory function stable and patient connected to nasal cannula oxygen Cardiovascular status: blood pressure returned to baseline and stable Postop Assessment: no apparent nausea or vomiting Anesthetic complications: no    Lance Galas ELAINE

## 2019-05-23 NOTE — Anesthesia Preprocedure Evaluation (Signed)
Anesthesia Evaluation  Patient identified by MRN, date of birth, ID band Patient awake    Reviewed: Allergy & Precautions, H&P , NPO status , Patient's Chart, lab work & pertinent test results, reviewed documented beta blocker date and time   History of Anesthesia Complications (+) PONV and history of anesthetic complications  Airway Mallampati: II  TM Distance: >3 FB Neck ROM: full    Dental no notable dental hx.    Pulmonary neg pulmonary ROS,    Pulmonary exam normal breath sounds clear to auscultation       Cardiovascular Exercise Tolerance: Good hypertension,  Rhythm:regular Rate:Normal     Neuro/Psych negative neurological ROS  negative psych ROS   GI/Hepatic negative GI ROS, Neg liver ROS,   Endo/Other  negative endocrine ROS  Renal/GU negative Renal ROS  negative genitourinary   Musculoskeletal   Abdominal   Peds  Hematology negative hematology ROS (+)   Anesthesia Other Findings   Reproductive/Obstetrics negative OB ROS                             Anesthesia Physical Anesthesia Plan  ASA: II  Anesthesia Plan: General   Post-op Pain Management:    Induction:   PONV Risk Score and Plan: Propofol infusion and TIVA  Airway Management Planned:   Additional Equipment:   Intra-op Plan:   Post-operative Plan:   Informed Consent: I have reviewed the patients History and Physical, chart, labs and discussed the procedure including the risks, benefits and alternatives for the proposed anesthesia with the patient or authorized representative who has indicated his/her understanding and acceptance.     Dental Advisory Given  Plan Discussed with: CRNA  Anesthesia Plan Comments:         Anesthesia Quick Evaluation   Zofran

## 2019-05-23 NOTE — H&P (Signed)
Karen Bellows, MD [address], [place], [place], [place], [address] [address], [place], [place], [place], [address] Phone: [phone]  Fax: [phone]  Primary Care Physician:  Mar Daring, PA-C   Pre-Procedure History & Physical: HPI:  Karen Morrison is a 48 y.o. female is here for an colonoscopy.   Past Medical History:  Diagnosis Date  . Allergic rhinitis   . Arthritis    joints  . BMI 45.0-49.9, adult ([place]) 01/30/2017  . Hypercholesteremia   . Hypertension    controlled on medicine  . Motion sickness   . Obesity   . PONV (postoperative nausea and vomiting)   . Wears partial dentures    lower    Past Surgical History:  Procedure Laterality Date  . CESAREAN SECTION  1994  . CHOLECYSTECTOMY  1999  . CYSTOSCOPY  04/04/2017   Procedure: CYSTOSCOPY;  Surgeon: Gae Dry, MD;  Location: [facility] ORS;  Service: Gynecology;;  . JOINT REPLACEMENT     left knee  . 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 TOTAL LAPAROSCOPIC HYSTERECTOMY WITH SALPINGECTOMY Bilateral 04/04/2017   Procedure: HYSTERECTOMY TOTAL LAPAROSCOPIC WITH SALPINGECTOMY;  Surgeon: Gae Dry, MD;  Location: [facility] ORS;  Service: Gynecology;  Laterality: Bilateral;  . VAGINAL BIRTH AFTER CESAREAN SECTION      Prior to Admission medications   Medication Sig Start Date End Date Taking? Authorizing Provider  acetaminophen (TYLENOL) 325 MG tablet Take 650 mg by mouth every 6 (six) hours as needed.   Yes [provider]  diphenhydrAMINE (BENADRYL) 25 MG tablet Take 25 mg by mouth daily as needed for itching (hives).   Yes [provider]  hydrochlorothiazide (HYDRODIURIL) 25 MG tablet Take 1 tablet (25 mg total) by mouth daily. Patient taking differently: Take 25 mg by mouth daily. am 04/01/19  Yes Mar Daring, PA-C  ibuprofen (ADVIL,MOTRIN) 200 MG tablet Take 200 mg by mouth daily as needed for headache or moderate pain.   Yes [provider]   Multiple Vitamins-Minerals (EMERGEN-C IMMUNE PO) Take 2 tablets by mouth daily.   Yes [provider]  meloxicam (MOBIC) 7.5 MG tablet Take 1 tablet (7.5 mg total) by mouth daily. Patient taking differently: Take 7.5 mg by mouth daily. am 01/11/19   Mar Daring, PA-C  Multiple Vitamins-Minerals (HAIR SKIN AND NAILS FORMULA) TABS Take 1 tablet by mouth daily.    [provider]    Allergies as of 05/15/2019 - Review Complete 04/01/2019  Allergen Reaction Noted  . Hydrocodone-acetaminophen Nausea And Vomiting 11/12/2015  . Amoxicillin Hives, Itching, and Rash 11/12/2015  . Apple cider vinegar Rash 03/09/2018  . Azithromycin Hives, Itching, and Rash 11/12/2015  . Doxycycline Rash 03/09/2018  . Penicillins Hives, Itching, and Rash 11/12/2015    Family History  Problem Relation Age of Onset  . Diabetes Mother        Type 2  . Breast cancer Paternal Aunt        2s?  Marland Kitchen Alzheimer's disease Maternal Grandmother   . Heart attack Maternal Grandfather   . Emphysema Paternal Grandfather   . Healthy Father     Social History   Socioeconomic History  . Marital status: Married    Spouse name: Edd Arbour  . Number of children: 2  . Years of education: HS  . Highest education level: Not on file  Occupational History  . Not on file  Social Needs  . Financial resource strain: Not on file  .  Food insecurity    Worry: Not on file    Inability: Not on file  . Transportation needs    Medical: Not on file    Non-medical: Not on file  Tobacco Use  . Smoking status: Never Smoker  . Smokeless tobacco: Never Used  Substance and Sexual Activity  . Alcohol use: No    Alcohol/week: 0.0 standard drinks  . Drug use: No  . Sexual activity: Yes    Birth control/protection: Pill, None  Lifestyle  . Physical activity    Days per week: Not on file    Minutes per session: Not on file  . Stress: Not on file  Relationships  . Social Herbalist on phone: Not on  file    Gets together: Not on file    Attends religious service: Not on file    Active member of club or organization: Not on file    Attends meetings of clubs or organizations: Not on file    Relationship status: Not on file  . Intimate partner violence    Fear of current or ex partner: Not on file    Emotionally abused: Not on file    Physically abused: Not on file    Forced sexual activity: Not on file  Other Topics Concern  . Not on file  Social History Narrative  . Not on file    Review of Systems: See HPI, otherwise negative ROS  Physical Exam: BP (!) 135/96   Pulse 100   Temp 98.1 F (36.7 C) (Temporal)   Resp 18   Ht 5\' 5"  (1.651 m)   Wt 113.9 kg   LMP 03/16/2017 (Exact Date)   SpO2 97%   BMI 41.79 kg/m  General:   Alert,  pleasant and cooperative in NAD Head:  Normocephalic and atraumatic. Neck:  Supple; no masses or thyromegaly. Lungs:  Clear throughout to auscultation, normal respiratory effort.    Heart:  +S1, +S2, Regular rate and rhythm, No edema. Abdomen:  Soft, nontender and nondistended. Normal bowel sounds, without guarding, and without rebound.   Neurologic:  Alert and  oriented x4;  grossly normal neurologically.  Impression/Plan: Karen Morrison is here for an colonoscopy to be performed for family history of colon polyps. Risks, benefits, limitations, and alternatives regarding  colonoscopy have been reviewed with the patient.  Questions have been answered.  All parties agreeable.   Karen Bellows, MD  05/23/2019, 8:37 AM

## 2019-05-24 ENCOUNTER — Encounter: Payer: Self-pay | Admitting: Gastroenterology

## 2019-05-27 LAB — SURGICAL PATHOLOGY

## 2019-06-02 ENCOUNTER — Encounter: Payer: Self-pay | Admitting: Gastroenterology

## 2019-07-11 ENCOUNTER — Other Ambulatory Visit: Payer: Self-pay | Admitting: Physician Assistant

## 2019-07-11 DIAGNOSIS — M170 Bilateral primary osteoarthritis of knee: Secondary | ICD-10-CM

## 2019-08-28 ENCOUNTER — Telehealth: Payer: Self-pay | Admitting: Physician Assistant

## 2019-08-28 NOTE — Telephone Encounter (Signed)
°  Pt calling to check on Family FMLA paperwork that was left for [place] to fill out last Thurs. Deadline is in a few days.  Please call [place] back at [phone] to let her know.  Thanks, [employer]

## 2019-08-28 NOTE — Telephone Encounter (Signed)
Form completed.

## 2019-08-28 NOTE — Telephone Encounter (Signed)
This is regarding her husband Aubra Walch This is for [employer].

## 2019-08-28 NOTE — Telephone Encounter (Signed)
Form was faxed and patient wife was notified.

## 2019-09-29 ENCOUNTER — Other Ambulatory Visit: Payer: Self-pay | Admitting: Physician Assistant

## 2019-09-29 DIAGNOSIS — I10 Essential (primary) hypertension: Secondary | ICD-10-CM

## 2019-10-15 ENCOUNTER — Other Ambulatory Visit: Payer: Self-pay | Admitting: Physician Assistant

## 2019-10-15 DIAGNOSIS — Z1231 Encounter for screening mammogram for malignant neoplasm of breast: Secondary | ICD-10-CM

## 2019-12-02 ENCOUNTER — Ambulatory Visit
Admission: RE | Admit: 2019-12-02 | Discharge: 2019-12-02 | Disposition: A | Discharge status: Home / Self Care | Payer: Managed Care, Other (non HMO) | Source: Ambulatory Visit | Attending: Physician Assistant | Admitting: Physician Assistant

## 2019-12-02 DIAGNOSIS — Z1231 Encounter for screening mammogram for malignant neoplasm of breast: Secondary | ICD-10-CM | POA: Diagnosis present

## 2020-01-01 ENCOUNTER — Other Ambulatory Visit: Payer: Self-pay | Admitting: Physician Assistant

## 2020-01-01 DIAGNOSIS — M170 Bilateral primary osteoarthritis of knee: Secondary | ICD-10-CM

## 2020-01-01 MED ORDER — MELOXICAM 7.5 MG PO TABS: ORAL_TABLET | ORAL | 1 refills | Status: DC

## 2020-01-01 NOTE — Telephone Encounter (Signed)
Medication Refill - Medication: meloxicam (MOBIC) 7.5 MG tablet  Has the patient contacted their pharmacy? No - states no refills left.  Needs 90 day supply for insurance to cover.  Has upcoming physical. (Agent: If no, request that the patient contact the pharmacy for the refill.) (Agent: If yes, when and what did the pharmacy advise?)  Preferred Pharmacy (with phone number or street name):  [facility] [id number], [place] Phone:  [phone]  Fax:  [phone]     Agent: Please be advised that RX refills may take up to 3 business days. We ask that you follow-up with your pharmacy.

## 2020-01-01 NOTE — Telephone Encounter (Signed)
Requested Prescriptions  Pending Prescriptions Disp Refills  . meloxicam (MOBIC) 7.5 MG tablet 90 tablet 1    Sig: TAKE 1 TABLET(7.5 MG) BY MOUTH DAILY     Analgesics:  COX2 Inhibitors Passed - 01/01/2020  9:57 AM      Passed - HGB in normal range and within 360 days    Hemoglobin  Date Value Ref Range Status  04/01/2019 14.5 11.1 - 15.9 g/dL Final         Passed - Cr in normal range and within 360 days    Creatinine  Date Value Ref Range Status  01/03/2013 0.74 0.60 - 1.30 mg/dL Final   Creatinine, Ser  Date Value Ref Range Status  04/01/2019 0.64 0.57 - 1.00 mg/dL Final         Passed - Patient is not pregnant      Passed - Valid encounter within last 12 months    Recent Outpatient Visits          9 months ago Annual physical exam   [facility] [place], [place], PA-C   1 year ago Non-recurrent acute suppurative otitis media of both ears without spontaneous rupture of tympanic membranes   [facility], [place], [place]   1 year ago Viral URI   [facility] [place], Wendee [place], [place]   1 year ago Essential hypertension   [facility], Clearnce Sorrel, [place]   1 year ago Essential hypertension   [facility], Clearnce Sorrel, [place]      Future Appointments            In 3 months [place], Clearnce [place], PA-C [employer], [place]

## 2020-04-02 NOTE — Progress Notes (Signed)
Complete physical exam   Patient: Karen Morrison   DOB: [birth year]   49 y.o. Female  MRN: [id number] Visit Date: 04/03/2020  Today's healthcare provider: Mar Daring, PA-C   Chief Complaint  Patient presents with  . Annual Exam   Subjective    Karen Morrison is a 49 y.o. female who presents today for a complete physical exam.  She reports consuming a general diet. The patient does not participate in regular exercise at present. She generally feels well. She reports sleeping fairly well. She does not have additional problems to discuss today.  HPI  04/01/2019 CPE 01/29/2016 Pap/HPV-negative 12/02/2019 Mammogram-BI-RADS 1 05/23/2019 Colonoscopy-polyps 05/23/2019 Pathology-negative  Past Medical History:  Diagnosis Date  . Allergic rhinitis   . Arthritis    joints  . BMI 45.0-49.9, adult ([place]) 01/30/2017  . Hypercholesteremia   . Hypertension    controlled on medicine  . Motion sickness   . Obesity   . PONV (postoperative nausea and vomiting)   . Wears partial dentures    lower   Past Surgical History:  Procedure Laterality Date  . CESAREAN SECTION  1994  . CHOLECYSTECTOMY  1999  . COLONOSCOPY WITH PROPOFOL N/A 05/23/2019   Procedure: COLONOSCOPY WITH PROPOFOL;  Surgeon: Jonathon Bellows, MD;  Location: [facility];  Service: Endoscopy;  Laterality: N/A;  . CYSTOSCOPY  04/04/2017   Procedure: CYSTOSCOPY;  Surgeon: Gae Dry, MD;  Location: [facility];  Service: Gynecology;;  . JOINT REPLACEMENT     left knee  . POLYPECTOMY  05/23/2019   Procedure: POLYPECTOMY;  Surgeon: Jonathon Bellows, MD;  Location: [facility];  Service: Endoscopy;;  . 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 TOTAL LAPAROSCOPIC HYSTERECTOMY WITH SALPINGECTOMY Bilateral 04/04/2017   Procedure: HYSTERECTOMY TOTAL LAPAROSCOPIC WITH SALPINGECTOMY;  Surgeon: Gae Dry, MD;  Location: [facility];  Service: Gynecology;  Laterality: Bilateral;  . VAGINAL BIRTH AFTER CESAREAN SECTION      Social History   Socioeconomic History  . Marital status: Married    Spouse name: Edd Arbour  . Number of children: 2  . Years of education: HS  . Highest education level: Not on file  Occupational History  . Not on file  Tobacco Use  . Smoking status: Never Smoker  . Smokeless tobacco: Never Used  Vaping Use  . Vaping Use: Never used  Substance and Sexual Activity  . Alcohol use: No    Alcohol/week: 0.0 standard drinks  . Drug use: No  . Sexual activity: Yes    Birth control/protection: Pill, None  Other Topics Concern  . Not on file  Social History Narrative  . Not on file   Social Determinants of Health   Financial Resource Strain:   . Difficulty of Paying Living Expenses:   Food Insecurity:   . Worried About Charity fundraiser in the Last Year:   . Arboriculturist in the Last Year:   Transportation Needs:   . Film/video editor (Medical):   Marland Kitchen Lack of Transportation (Non-Medical):   Physical Activity:   . Days of Exercise per Week:   . Minutes of Exercise per Session:   Stress:   . Feeling of Stress :   Social Connections:   . Frequency of Communication with Friends and Family:   . Frequency of Social Gatherings with Friends and Family:   . Attends Religious Services:   . Active Member of Clubs or Organizations:   . Attends Club or  Organization Meetings:   Marland Kitchen Marital Status:   Intimate Partner Violence:   . Fear of Current or Ex-Partner:   . Emotionally Abused:   Marland Kitchen Physically Abused:   . Sexually Abused:    Family Status  Relation Name Status  . Mother Brayton Caves Deceased at age 71  . [employer]  . MGM  Alive  . MGF  Deceased  . PGF  Deceased  . PGM  Deceased  . Father  Alive  . Sister  Alive  . Daughter  Alive  . Son  Alive  . Sister  Alive   Family History  Problem Relation Age of Onset  . Diabetes Mother        Type 2  . Breast cancer Paternal Aunt        40s?  Marland Kitchen Alzheimer's disease Maternal Grandmother   . Heart  attack Maternal Grandfather   . Emphysema Paternal Grandfather   . Healthy Father   . Healthy Sister   . Healthy Daughter   . Healthy Son   . Healthy Sister    Allergies  Allergen Reactions  . Hydrocodone-Acetaminophen Nausea And Vomiting  . Amoxicillin Hives, Itching and Rash  . Apple Cider Vinegar Rash  . Azithromycin Hives, Itching and Rash  . Doxycycline Rash  . Penicillins Hives, Itching and Rash    Has patient had a PCN reaction causing immediate rash, facial/tongue/throat swelling, SOB or lightheadedness with hypotension: Yes Has patient had a PCN reaction causing severe rash involving mucus membranes or skin necrosis: Yes Has patient had a PCN reaction that required hospitalization: No Has patient had a PCN reaction occurring within the last 10 years: No If all of the above answers are "NO", then may proceed with Cephalosporin use.     Patient Care Team: Rubye Beach as PCP - General (Family Medicine)   Medications: Outpatient Medications Prior to Visit  Medication Sig  . acetaminophen (TYLENOL) 325 MG tablet Take 650 mg by mouth every 6 (six) hours as needed.  . diphenhydrAMINE (BENADRYL) 25 MG tablet Take 25 mg by mouth daily as needed for itching (hives).  . hydrochlorothiazide (HYDRODIURIL) 25 MG tablet TAKE 1 TABLET(25 MG) BY MOUTH DAILY  . ibuprofen (ADVIL,MOTRIN) 200 MG tablet Take 200 mg by mouth daily as needed for headache or moderate pain.  . meloxicam (MOBIC) 7.5 MG tablet TAKE 1 TABLET(7.5 MG) BY MOUTH DAILY  . Multiple Vitamins-Minerals (EMERGEN-C IMMUNE PO) Take 2 tablets by mouth daily. (Patient not taking: Reported on 04/03/2020)  . Multiple Vitamins-Minerals (HAIR SKIN AND NAILS FORMULA) TABS Take 1 tablet by mouth daily. (Patient not taking: Reported on 04/03/2020)   No facility-administered medications prior to visit.    Review of Systems  Constitutional: Negative.   HENT: Negative.   Eyes: Negative.   Respiratory: Negative.     Cardiovascular: Negative.   Gastrointestinal: Negative.   Endocrine: Negative.   Genitourinary: Negative.   Musculoskeletal: Negative.   Skin: Negative.   Allergic/Immunologic: Negative.   Neurological: Negative.   Hematological: Negative.   Psychiatric/Behavioral: Negative.     Last metabolic panel Lab Results  Component Value Date   GLUCOSE 86 04/01/2019   NA 139 04/01/2019   K 3.9 04/01/2019   CL 99 04/01/2019   CO2 26 04/01/2019   BUN 10 04/01/2019   CREATININE 0.64 04/01/2019   GFRNONAA 105 04/01/2019   GFRAA 121 04/01/2019   CALCIUM 9.5 04/01/2019   PROT 6.4 04/01/2019   ALBUMIN 4.3 04/01/2019  LABGLOB 2.1 04/01/2019   AGRATIO 2.0 04/01/2019   BILITOT 0.4 04/01/2019   ALKPHOS 93 04/01/2019   AST 13 04/01/2019   ALT 12 04/01/2019   ANIONGAP 11 01/03/2013   Last lipids Lab Results  Component Value Date   CHOL 247 (H) 04/01/2019   HDL 63 04/01/2019   LDLCALC 162 (H) 04/01/2019   TRIG 109 04/01/2019   CHOLHDL 3.9 04/01/2019      Objective    BP 115/82 (BP Location: Left Arm, Patient Position: Sitting, Cuff Size: Large)   Pulse 93   Temp (!) 97.1 F (36.2 C) (Temporal)   Resp 16   Ht 5\' 5"  (1.651 m)   Wt 289 lb 9.6 oz (131.4 kg)   LMP 03/16/2017 (Exact Date)   BMI 48.19 kg/m  BP Readings from Last 3 Encounters:  04/03/20 115/82  05/23/19 112/71  04/01/19 120/82   Wt Readings from Last 3 Encounters:  04/03/20 289 lb 9.6 oz (131.4 kg)  05/23/19 251 lb 1.6 oz (113.9 kg)  04/01/19 250 lb (113.4 kg)      Physical Exam Vitals reviewed. Exam conducted with a chaperone present.  Constitutional:      General: She is not in acute distress.    Appearance: Normal appearance. She is well-developed. She is obese. She is not ill-appearing or diaphoretic.  HENT:     Head: Normocephalic and atraumatic.     Right Ear: Hearing, tympanic membrane, ear canal and external ear normal.     Left Ear: Hearing, tympanic membrane, ear canal and external ear  normal.     Nose: Nose normal.     Mouth/Throat:     Mouth: Mucous membranes are moist.     Pharynx: Oropharynx is clear. Uvula midline. No oropharyngeal exudate.  Eyes:     General: No scleral icterus.       Right eye: No discharge.        Left eye: No discharge.     Extraocular Movements: Extraocular movements intact.     Conjunctiva/sclera: Conjunctivae normal.     Pupils: Pupils are equal, round, and reactive to light.  Neck:     Thyroid: No thyromegaly.     Vascular: No JVD.     Trachea: No tracheal deviation.  Cardiovascular:     Rate and Rhythm: Normal rate and regular rhythm.     Pulses: Normal pulses.     Heart sounds: Normal heart sounds. No murmur heard.  No friction rub. No gallop.   Pulmonary:     Effort: Pulmonary effort is normal. No respiratory distress.     Breath sounds: Normal breath sounds. No wheezing or rales.  Chest:     Chest wall: No tenderness.     Breasts: Breasts are symmetrical.        Right: No inverted nipple, mass, nipple discharge, skin change or tenderness.        Left: No inverted nipple, mass, nipple discharge, skin change or tenderness.  Abdominal:     General: Abdomen is flat. Bowel sounds are normal. There is no distension.     Palpations: Abdomen is soft. There is no mass.     Tenderness: There is no abdominal tenderness. There is no guarding or rebound.     Hernia: There is no hernia in the left inguinal area.  Genitourinary:    General: Normal vulva.     Exam position: Supine.     Labia:        Right: No rash, tenderness, lesion  or injury.        Left: No rash, tenderness, lesion or injury.      Vagina: Normal. No signs of injury. No vaginal discharge, erythema, tenderness or bleeding.     Uterus: Absent.      Adnexa: Right adnexa normal and left adnexa normal.       Right: No mass, tenderness or fullness.         Left: No mass, tenderness or fullness.       Rectum: Normal.     Comments:   Musculoskeletal:        General: No  tenderness or deformity. Normal range of motion.     Cervical back: Normal range of motion and neck supple.     Right lower leg: No edema.     Left lower leg: No edema.  Lymphadenopathy:     Cervical: No cervical adenopathy.  Skin:    General: Skin is warm and dry.     Capillary Refill: Capillary refill takes less than 2 seconds.     Findings: No rash.  Neurological:     General: No focal deficit present.     Mental Status: She is alert and oriented to person, place, and time. Mental status is at baseline.     Cranial Nerves: No cranial nerve deficit.     Sensory: No sensory deficit.     Motor: No weakness.     Coordination: Coordination normal.     Gait: Gait normal.     Deep Tendon Reflexes: Reflexes are normal and symmetric.  Psychiatric:        Mood and Affect: Mood normal.        Behavior: Behavior normal.        Thought Content: Thought content normal.        Judgment: Judgment normal.     Depression Screen  PHQ 2/9 Scores 04/03/2020 04/01/2019 02/02/2018  PHQ - 2 Score 0 2 0  PHQ- 9 Score 3 4 -    No results found for any visits on 04/03/20.  Assessment & Plan    Routine Health Maintenance and Physical Exam  Exercise Activities and Dietary recommendations Goals   None     Immunization History  Administered Date(s) Administered  . Influenza Inj Mdck Quad Pf 08/04/2017  . Influenza Split 07/06/2012, 08/26/2013  . Td 07/19/2005  . Tdap 01/23/2015    Health Maintenance  Topic Date Due  . Hepatitis C Screening  Never done  . COVID-19 Vaccine (1) Never done  . INFLUENZA VACCINE  05/17/2020  . MAMMOGRAM  12/01/2021  . COLONOSCOPY  05/22/2024  . TETANUS/TDAP  01/22/2025  . HIV Screening  Completed    Discussed health benefits of physical activity, and encouraged her to engage in regular exercise appropriate for her age and condition.  1. Annual physical exam Normal physical exam today. Will check labs as below and f/u pending lab results. If labs are  stable and WNL she will not need to have these rechecked for one year at her next annual physical exam. She is to call the office in the meantime if she has any acute issue, questions or concerns. - Comprehensive metabolic panel - Lipid Panel With LDL/HDL Ratio - CBC w/Diff/Platelet  2. Hypercholesteremia Diet controlled. Will check labs as below and f/u pending results. - Comprehensive metabolic panel - Lipid Panel With LDL/HDL Ratio  3. Essential hypertension Stable. Diagnosis pulled for medication refill. Continue current medical treatment plan. Will check labs as below and  f/u pending results. - hydrochlorothiazide (HYDRODIURIL) 25 MG tablet; Take 1 tablet (25 mg total) by mouth daily.  Dispense: 90 tablet; Refill: 2 - CBC w/Diff/Platelet  4. Need for hepatitis C screening test Will check labs as below and f/u pending results. - Hepatitis C antibody  5. Cervical cancer screening Pap collected today. Will send as below and f/u pending results. - Pap IG, CT/NG NAA, and HPV (high risk)  6. Primary osteoarthritis of both knees Stable. Diagnosis pulled for medication refill. Continue current medical treatment plan. - meloxicam (MOBIC) 7.5 MG tablet; Take 1 tablet (7.5 mg total) by mouth daily. TAKE 1 TABLET(7.5 MG) BY MOUTH DAILY  Dispense: 90 tablet; Refill: 1  7. Class 3 severe obesity due to excess calories with serious comorbidity and body mass index (BMI) of 45.0 to 49.9 in adult [facility]) Counseled patient on healthy lifestyle modifications including dieting and exercise.  Will check labs as below and f/u pending results. - HgB A1c   No follow-ups on file.     Reynolds Bowl, PA-C, have reviewed all documentation for this visit. The documentation on 04/05/20 for the exam, diagnosis, procedures, and orders are all accurate and complete.   Rubye Beach  [facility] [phone] (phone) [phone] (fax)  [place]

## 2020-04-03 ENCOUNTER — Ambulatory Visit (INDEPENDENT_AMBULATORY_CARE_PROVIDER_SITE_OTHER): Payer: Managed Care, Other (non HMO) | Admitting: Physician Assistant

## 2020-04-03 ENCOUNTER — Encounter: Payer: Self-pay | Admitting: Physician Assistant

## 2020-04-03 ENCOUNTER — Other Ambulatory Visit: Payer: Self-pay

## 2020-04-03 VITALS — BP 115/82 | HR 93 | Temp 97.1°F | Resp 16 | Ht 65.0 in | Wt 289.6 lb

## 2020-04-03 DIAGNOSIS — E7800 Pure hypercholesterolemia, unspecified: Secondary | ICD-10-CM

## 2020-04-03 DIAGNOSIS — Z124 Encounter for screening for malignant neoplasm of cervix: Secondary | ICD-10-CM

## 2020-04-03 DIAGNOSIS — Z6841 Body Mass Index (BMI) 40.0 and over, adult: Secondary | ICD-10-CM

## 2020-04-03 DIAGNOSIS — Z1159 Encounter for screening for other viral diseases: Secondary | ICD-10-CM | POA: Diagnosis not present

## 2020-04-03 DIAGNOSIS — I10 Essential (primary) hypertension: Secondary | ICD-10-CM | POA: Diagnosis not present

## 2020-04-03 DIAGNOSIS — Z0000 Encounter for general adult medical examination without abnormal findings: Secondary | ICD-10-CM

## 2020-04-03 DIAGNOSIS — M170 Bilateral primary osteoarthritis of knee: Secondary | ICD-10-CM

## 2020-04-03 MED ORDER — MELOXICAM 7.5 MG PO TABS: 7.5000 mg | ORAL_TABLET | Freq: Every day | ORAL | 1 refills | Status: DC

## 2020-04-03 MED ORDER — HYDROCHLOROTHIAZIDE 25 MG PO TABS: 25.0000 mg | ORAL_TABLET | Freq: Every day | ORAL | 2 refills | Status: DC

## 2020-04-03 NOTE — Patient Instructions (Signed)
Preventive Care 19-51 Years Old, Female Preventive care refers to visits with your health care provider and lifestyle choices that can promote health and wellness. This includes:  A yearly physical exam. This may also be called an annual well check.  Regular dental visits and eye exams.  Immunizations.  Screening for certain conditions.  Healthy lifestyle choices, such as eating a healthy diet, getting regular exercise, not using drugs or products that contain nicotine and tobacco, and limiting alcohol use. What can I expect for my preventive care visit? Physical exam Your health care provider will check your:  Height and weight. This may be used to calculate body mass index (BMI), which tells if you are at a healthy weight.  Heart rate and blood pressure.  Skin for abnormal spots. Counseling Your health care provider may ask you questions about your:  Alcohol, tobacco, and drug use.  Emotional well-being.  Home and relationship well-being.  Sexual activity.  Eating habits.  Work and work Statistician.  Method of birth control.  Menstrual cycle.  Pregnancy history. What immunizations do I need?  Influenza (flu) vaccine  This is recommended every year. Tetanus, diphtheria, and pertussis (Tdap) vaccine  You may need a Td booster every 10 years. Varicella (chickenpox) vaccine  You may need this if you have not been vaccinated. Zoster (shingles) vaccine  You may need this after age 63. Measles, mumps, and rubella (MMR) vaccine  You may need at least one dose of MMR if you were born in 1957 or later. You may also need a second dose. Pneumococcal conjugate (PCV13) vaccine  You may need this if you have certain conditions and were not previously vaccinated. Pneumococcal polysaccharide (PPSV23) vaccine  You may need one or two doses if you smoke cigarettes or if you have certain conditions. Meningococcal conjugate (MenACWY) vaccine  You may need this if you  have certain conditions. Hepatitis A vaccine  You may need this if you have certain conditions or if you travel or work in places where you may be exposed to hepatitis A. Hepatitis B vaccine  You may need this if you have certain conditions or if you travel or work in places where you may be exposed to hepatitis B. Haemophilus influenzae type b (Hib) vaccine  You may need this if you have certain conditions. Human papillomavirus (HPV) vaccine  If recommended by your health care provider, you may need three doses over 6 months. You may receive vaccines as individual doses or as more than one vaccine together in one shot (combination vaccines). Talk with your health care provider about the risks and benefits of combination vaccines. What tests do I need? Blood tests  Lipid and cholesterol levels. These may be checked every 5 years, or more frequently if you are over 8 years old.  Hepatitis C test.  Hepatitis B test. Screening  Lung cancer screening. You may have this screening every year starting at age 57 if you have a 30-pack-year history of smoking and currently smoke or have quit within the past 15 years.  Colorectal cancer screening. All adults should have this screening starting at age 69 and continuing until age 77. Your health care provider may recommend screening at age 15 if you are at increased risk. You will have tests every 1-10 years, depending on your results and the type of screening test.  Diabetes screening. This is done by checking your blood sugar (glucose) after you have not eaten for a while (fasting). You may have this  done every 1-3 years.  Mammogram. This may be done every 1-2 years. Talk with your health care provider about when you should start having regular mammograms. This may depend on whether you have a family history of breast cancer.  BRCA-related cancer screening. This may be done if you have a family history of breast, ovarian, tubal, or peritoneal  cancers.  Pelvic exam and Pap test. This may be done every 3 years starting at age 57. Starting at age 21, this may be done every 5 years if you have a Pap test in combination with an HPV test. Other tests  Sexually transmitted disease (STD) testing.  Bone density scan. This is done to screen for osteoporosis. You may have this scan if you are at high risk for osteoporosis. Follow these instructions at home: Eating and drinking  Eat a diet that includes fresh fruits and vegetables, whole grains, lean protein, and low-fat dairy.  Take vitamin and mineral supplements as recommended by your health care provider.  Do not drink alcohol if: ? Your health care provider tells you not to drink. ? You are pregnant, may be pregnant, or are planning to become pregnant.  If you drink alcohol: ? Limit how much you have to 0-1 drink a day. ? Be aware of how much alcohol is in your drink. In the [place] one drink equals one 12 oz bottle of beer (355 mL), one 5 oz glass of wine (148 mL), or one 1 oz glass of hard liquor (44 mL). Lifestyle  Take daily care of your teeth and gums.  Stay active. Exercise for at least 30 minutes on 5 or more days each week.  Do not use any products that contain nicotine or tobacco, such as cigarettes, e-cigarettes, and chewing tobacco. If you need help quitting, ask your health care provider.  If you are sexually active, practice safe sex. Use a condom or other form of birth control (contraception) in order to prevent pregnancy and STIs (sexually transmitted infections).  If told by your health care provider, take low-dose aspirin daily starting at age 71. What's next?  Visit your health care provider once a year for a well check visit.  Ask your health care provider how often you should have your eyes and teeth checked.  Stay up to date on all vaccines. This information is not intended to replace advice given to you by your health care provider. Make sure you  discuss any questions you have with your health care provider. Document Revised: 06/14/2018 Document Reviewed: 06/14/2018 Elsevier Patient Education  2020 [employer].

## 2020-04-04 LAB — COMPREHENSIVE METABOLIC PANEL
ALT: 25 IU/L (ref 0–32)
AST: 24 IU/L (ref 0–40)
Albumin/Globulin Ratio: 2 (ref 1.2–2.2)
Albumin: 4.4 g/dL (ref 3.8–4.8)
Alkaline Phosphatase: 118 IU/L (ref 48–121)
BUN/Creatinine Ratio: 16 (ref 9–23)
BUN: 12 mg/dL (ref 6–24)
Bilirubin Total: 0.4 mg/dL (ref 0.0–1.2)
CO2: 25 mmol/L (ref 20–29)
Calcium: 9.1 mg/dL (ref 8.7–10.2)
Chloride: 101 mmol/L (ref 96–106)
Creatinine, Ser: 0.74 mg/dL (ref 0.57–1.00)
GFR calc Af Amer: 109 mL/min/{1.73_m2} (ref >59)
GFR calc non Af Amer: 95 mL/min/{1.73_m2} (ref >59)
Globulin, Total: 2.2 g/dL (ref 1.5–4.5)
Glucose: 93 mg/dL (ref 65–99)
Potassium: 4.1 mmol/L (ref 3.5–5.2)
Sodium: 140 mmol/L (ref 134–144)
Total Protein: 6.6 g/dL (ref 6.0–8.5)

## 2020-04-04 LAB — CBC WITH DIFFERENTIAL/PLATELET
Basophils Absolute: 0.1 10*3/uL (ref 0.0–0.2)
Basos: 1 %
EOS (ABSOLUTE): 0.1 10*3/uL (ref 0.0–0.4)
Eos: 1 %
Hematocrit: 46.4 % (ref 34.0–46.6)
Hemoglobin: 14.9 g/dL (ref 11.1–15.9)
Immature Grans (Abs): 0 10*3/uL (ref 0.0–0.1)
Immature Granulocytes: 0 %
Lymphocytes Absolute: 2.1 10*3/uL (ref 0.7–3.1)
Lymphs: 24 %
MCH: 28.4 pg (ref 26.6–33.0)
MCHC: 32.1 g/dL (ref 31.5–35.7)
MCV: 89 fL (ref 79–97)
Monocytes Absolute: 0.8 10*3/uL (ref 0.1–0.9)
Monocytes: 9 %
Neutrophils Absolute: 5.5 10*3/uL (ref 1.4–7.0)
Neutrophils: 65 %
Platelets: 274 10*3/uL (ref 150–450)
RBC: 5.24 x10E6/uL (ref 3.77–5.28)
RDW: 14 % (ref 11.7–15.4)
WBC: 8.6 10*3/uL (ref 3.4–10.8)

## 2020-04-04 LAB — LIPID PANEL WITH LDL/HDL RATIO
Cholesterol, Total: 250 mg/dL — ABNORMAL HIGH (ref 100–199)
HDL: 68 mg/dL (ref >39)
LDL Chol Calc (NIH): 162 mg/dL — ABNORMAL HIGH (ref 0–99)
LDL/HDL Ratio: 2.4 ratio (ref 0.0–3.2)
Triglycerides: 112 mg/dL (ref 0–149)
VLDL Cholesterol Cal: 20 mg/dL (ref 5–40)

## 2020-04-04 LAB — HEMOGLOBIN A1C
Est. average glucose Bld gHb Est-mCnc: 111 mg/dL
Hgb A1c MFr Bld: 5.5 % (ref 4.8–5.6)

## 2020-04-04 LAB — HEPATITIS C ANTIBODY: Hep C Virus Ab: <0.1 s/co ratio (ref 0.0–0.9)

## 2020-04-06 ENCOUNTER — Other Ambulatory Visit: Payer: Self-pay | Admitting: Physician Assistant

## 2020-04-06 ENCOUNTER — Telehealth: Payer: Self-pay

## 2020-04-06 DIAGNOSIS — I10 Essential (primary) hypertension: Secondary | ICD-10-CM

## 2020-04-06 NOTE — Telephone Encounter (Signed)
-----   Message from Mar Daring, [place] sent at 04/05/2020  8:09 AM EDT ----- Kidney and liver function are normal. Sodium, potassium and calcium are normal. Cholesterol is increased compared to last 4 years. Current risk of having a cardiovascular event over the next 10 years is low at 1.4%, so no need for cholesterol lowering medication, but definitely continue to work on those healthy lifestyle changes like you had been. Blood count is normal. A1c/sugar is normal. Hep C screen is negative.

## 2020-04-06 NOTE — Telephone Encounter (Signed)
Written by Mar Daring, PA-C on 04/05/2020 8:09 AM EDT View Full Comments Seen by patient Ambrose Pancoast on 04/05/2020 8:19 AM

## 2020-04-12 LAB — IGP,CTNG,APTIMAHPV
Chlamydia, Nuc. Acid Amp: NEGATIVE
Gonococcus by Nucleic Acid Amp: NEGATIVE
HPV Aptima: NEGATIVE
PAP Smear Comment: 0

## 2020-04-14 ENCOUNTER — Telehealth: Payer: Self-pay

## 2020-04-14 NOTE — Telephone Encounter (Signed)
-----   Message from Mar Daring, [place] sent at 04/14/2020 10:49 AM EDT ----- Pap is normal, HPV negative.  Will repeat in 5 years. Also negative for gonorrhea and chlamydia.

## 2020-04-14 NOTE — Telephone Encounter (Signed)
Pt advised.   Thanks,   -Toussaint Golson  

## 2020-09-29 ENCOUNTER — Telehealth: Payer: Self-pay | Admitting: Physician Assistant

## 2020-09-29 DIAGNOSIS — M170 Bilateral primary osteoarthritis of knee: Secondary | ICD-10-CM

## 2020-09-29 MED ORDER — MELOXICAM 7.5 MG PO TABS: 7.5000 mg | ORAL_TABLET | Freq: Every day | ORAL | 1 refills | Status: DC

## 2020-09-29 NOTE — Telephone Encounter (Signed)
refilled 

## 2020-09-29 NOTE — Telephone Encounter (Signed)
[facility] faxed refill request for the following medications:  meloxicam (MOBIC) 7.5 MG tablet  Please advise. Thanks, [employer]

## 2020-10-27 ENCOUNTER — Other Ambulatory Visit: Payer: Self-pay | Admitting: Physician Assistant

## 2020-10-27 DIAGNOSIS — Z1231 Encounter for screening mammogram for malignant neoplasm of breast: Secondary | ICD-10-CM

## 2020-12-02 ENCOUNTER — Other Ambulatory Visit: Payer: Self-pay

## 2020-12-02 ENCOUNTER — Ambulatory Visit
Admission: RE | Admit: 2020-12-02 | Discharge: 2020-12-02 | Disposition: A | Discharge status: Home / Self Care | Payer: Managed Care, Other (non HMO) | Source: Ambulatory Visit | Attending: Physician Assistant | Admitting: Physician Assistant

## 2020-12-02 DIAGNOSIS — Z1231 Encounter for screening mammogram for malignant neoplasm of breast: Secondary | ICD-10-CM | POA: Diagnosis not present

## 2021-02-23 ENCOUNTER — Other Ambulatory Visit: Payer: Self-pay | Admitting: Physician Assistant

## 2021-02-23 DIAGNOSIS — I10 Essential (primary) hypertension: Secondary | ICD-10-CM

## 2021-02-23 MED ORDER — HYDROCHLOROTHIAZIDE 25 MG PO TABS: 25.0000 mg | ORAL_TABLET | Freq: Every day | ORAL | 0 refills | Status: DC

## 2021-02-23 NOTE — Telephone Encounter (Signed)
Medication Refill - Medication: Hydrochlorothiazide   Has the patient contacted their pharmacy? Yes.   Pt states that she is currently out of refills at pharmacy and is requesting to have medication sent to pharmacy to last until CPE on 04/13/21. Please advise.  (Agent: If no, request that the patient contact the pharmacy for the refill.) (Agent: If yes, when and what did the pharmacy advise?)  Preferred Pharmacy (with phone number or street name):  [id number] Karen Morrison, [place] [address]  [place] [address]  Phone: [phone] Fax: [phone]  Hours: Not open 24 hours     Agent: Please be advised that RX refills may take up to 3 business days. We ask that you follow-up with your pharmacy.

## 2021-03-29 ENCOUNTER — Telehealth: Payer: Self-pay

## 2021-03-29 DIAGNOSIS — M170 Bilateral primary osteoarthritis of knee: Secondary | ICD-10-CM

## 2021-03-29 MED ORDER — MELOXICAM 7.5 MG PO TABS: 7.5000 mg | ORAL_TABLET | Freq: Every day | ORAL | 0 refills | Status: DC

## 2021-03-29 NOTE — Telephone Encounter (Signed)
Rx was sent to pharmacy. 

## 2021-03-29 NOTE — Telephone Encounter (Signed)
[facility] faxed refill request for the following medications:  meloxicam (MOBIC) 7.5 MG tablet    Please advise.  

## 2021-04-05 ENCOUNTER — Encounter: Payer: Self-pay | Admitting: Physician Assistant

## 2021-04-13 ENCOUNTER — Encounter: Payer: Self-pay | Admitting: Family Medicine

## 2021-04-13 ENCOUNTER — Other Ambulatory Visit: Payer: Self-pay

## 2021-04-13 ENCOUNTER — Ambulatory Visit (INDEPENDENT_AMBULATORY_CARE_PROVIDER_SITE_OTHER): Payer: Managed Care, Other (non HMO) | Admitting: Family Medicine

## 2021-04-13 VITALS — BP 135/74 | HR 110 | Temp 98.1°F | Resp 16 | Ht 65.0 in | Wt 295.0 lb

## 2021-04-13 DIAGNOSIS — Z0000 Encounter for general adult medical examination without abnormal findings: Secondary | ICD-10-CM

## 2021-04-13 DIAGNOSIS — I10 Essential (primary) hypertension: Secondary | ICD-10-CM | POA: Diagnosis not present

## 2021-04-13 DIAGNOSIS — Z23 Encounter for immunization: Secondary | ICD-10-CM | POA: Diagnosis not present

## 2021-04-13 DIAGNOSIS — Z6841 Body Mass Index (BMI) 40.0 and over, adult: Secondary | ICD-10-CM

## 2021-04-13 DIAGNOSIS — E7800 Pure hypercholesterolemia, unspecified: Secondary | ICD-10-CM | POA: Diagnosis not present

## 2021-04-13 DIAGNOSIS — M170 Bilateral primary osteoarthritis of knee: Secondary | ICD-10-CM

## 2021-04-13 MED ORDER — HYDROCHLOROTHIAZIDE 25 MG PO TABS: 25.0000 mg | ORAL_TABLET | Freq: Every day | ORAL | 3 refills | Status: DC

## 2021-04-13 MED ORDER — PHENTERMINE HCL 37.5 MG PO CAPS: 37.5000 mg | ORAL_CAPSULE | ORAL | 2 refills | Status: DC

## 2021-04-13 MED ORDER — MELOXICAM 7.5 MG PO TABS: 7.5000 mg | ORAL_TABLET | Freq: Every day | ORAL | 1 refills | Status: DC

## 2021-04-13 NOTE — Assessment & Plan Note (Signed)
Reviewed last lipid panel Not currently on a statin Recheck FLP and CMP Discussed diet and exercise  

## 2021-04-13 NOTE — Assessment & Plan Note (Signed)
Discussed importance of healthy weight management Discussed diet and exercise  Will try 20m of phentermine - discussed risks

## 2021-04-13 NOTE — Assessment & Plan Note (Signed)
Well controlled Continue current medications Recheck metabolic panel 

## 2021-04-13 NOTE — Patient Instructions (Signed)
Preventive Care 52-52 Years Old, Female Preventive care refers to lifestyle choices and visits with your health care provider that can promote health and wellness. This includes: A yearly physical exam. This is also called an annual wellness visit. Regular dental and eye exams. Immunizations. Screening for certain conditions. Healthy lifestyle choices, such as: Eating a healthy diet. Getting regular exercise. Not using drugs or products that contain nicotine and tobacco. Limiting alcohol use. What can I expect for my preventive care visit? Physical exam Your health care provider will check your: Height and weight. These may be used to calculate your BMI (body mass index). BMI is a measurement that tells if you are at a healthy weight. Heart rate and blood pressure. Body temperature. Skin for abnormal spots. Counseling Your health care provider may ask you questions about your: Past medical problems. Family's medical history. Alcohol, tobacco, and drug use. Emotional well-being. Home life and relationship well-being. Sexual activity. Diet, exercise, and sleep habits. Work and work Statistician. Access to firearms. Method of birth control. Menstrual cycle. Pregnancy history. What immunizations do I need?  Vaccines are usually given at various ages, according to a schedule. Your health care provider will recommend vaccines for you based on your age, medicalhistory, and lifestyle or other factors, such as travel or where you work. What tests do I need? Blood tests Lipid and cholesterol levels. These may be checked every 5 years, or more often if you are over 37 years old. Hepatitis C test. Hepatitis B test. Screening Lung cancer screening. You may have this screening every year starting at age 30 if you have a 30-pack-year history of smoking and currently smoke or have quit within the past 15 years. Colorectal cancer screening. All adults should have this screening starting at  age 23 and continuing until age 3. Your health care provider may recommend screening at age 88 if you are at increased risk. You will have tests every 1-10 years, depending on your results and the type of screening test. Diabetes screening. This is done by checking your blood sugar (glucose) after you have not eaten for a while (fasting). You may have this done every 1-3 years. Mammogram. This may be done every 1-2 years. Talk with your health care provider about when you should start having regular mammograms. This may depend on whether you have a family history of breast cancer. BRCA-related cancer screening. This may be done if you have a family history of breast, ovarian, tubal, or peritoneal cancers. Pelvic exam and Pap test. This may be done every 3 years starting at age 79. Starting at age 54, this may be done every 5 years if you have a Pap test in combination with an HPV test. Other tests STD (sexually transmitted disease) testing, if you are at risk. Bone density scan. This is done to screen for osteoporosis. You may have this scan if you are at high risk for osteoporosis. Talk with your health care provider about your test results, treatment options,and if necessary, the need for more tests. Follow these instructions at home: Eating and drinking  Eat a diet that includes fresh fruits and vegetables, whole grains, lean protein, and low-fat dairy products. Take vitamin and mineral supplements as recommended by your health care provider. Do not drink alcohol if: Your health care provider tells you not to drink. You are pregnant, may be pregnant, or are planning to become pregnant. If you drink alcohol: Limit how much you have to 0-1 drink a day. Be aware  of how much alcohol is in your drink. In the [place] one drink equals one 12 oz bottle of beer (355 mL), one 5 oz glass of wine (148 mL), or one 1 oz glass of hard liquor (44 mL).  Lifestyle Take daily care of your teeth and  gums. Brush your teeth every morning and night with fluoride toothpaste. Floss one time each day. Stay active. Exercise for at least 30 minutes 5 or more days each week. Do not use any products that contain nicotine or tobacco, such as cigarettes, e-cigarettes, and chewing tobacco. If you need help quitting, ask your health care provider. Do not use drugs. If you are sexually active, practice safe sex. Use a condom or other form of protection to prevent STIs (sexually transmitted infections). If you do not wish to become pregnant, use a form of birth control. If you plan to become pregnant, see your health care provider for a prepregnancy visit. If told by your health care provider, take low-dose aspirin daily starting at age 29. Find healthy ways to cope with stress, such as: Meditation, yoga, or listening to music. Journaling. Talking to a trusted person. Spending time with friends and family. Safety Always wear your seat belt while driving or riding in a vehicle. Do not drive: If you have been drinking alcohol. Do not ride with someone who has been drinking. When you are tired or distracted. While texting. Wear a helmet and other protective equipment during sports activities. If you have firearms in your house, make sure you follow all gun safety procedures. What's next? Visit your health care provider once a year for an annual wellness visit. Ask your health care provider how often you should have your eyes and teeth checked. Stay up to date on all vaccines. This information is not intended to replace advice given to you by your health care provider. Make sure you discuss any questions you have with your healthcare provider. Document Revised: 07/07/2020 Document Reviewed: 06/14/2018 Elsevier Patient Education  2022 [employer].

## 2021-04-13 NOTE — Progress Notes (Signed)
Complete physical exam   Patient: Karen Morrison   DOB: 1971-08-19   50 y.o. Female  MRN: [id number] Visit Date: 04/13/2021  Today's healthcare provider: Lavon Paganini, MD   Chief Complaint  Patient presents with   Annual Exam   Subjective    HPI  Karen Morrison is a 50 y.o. female who presents today for a complete physical exam.  She reports consuming a general diet. The patient does not participate in regular exercise at present. She generally feels well. She reports sleeping well. She does not have additional problems to discuss today.   Weight gain She wants to discuss her weight and she admits to emotional eating. and it was exacerbated during [place]. She was taking phentermine and it was affecting her sleep. She began taking it in the morning and tolerated the medication. However she stopped using it and is requesting to begin again.   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 She has an appointment with Dr. Marry Guan July 7th to evaluate her right knee because she has been having pain. To manage her pain she takes 7.5 mg meloxicam.   Hysterectomy  She had multiple clots, severe vomiting and excessive bleeding since 50 yrs old and she eventually received a hysterectomy. She additionally had some abnormal pap smears however there were no cancerous findings.   Screenings Last Reported Mammogram 12/04/20 Pap smear- 04/03/20 Colonoscopy-05/23/19  Vaccines She has received 3 COVID vaccines. She hasn't received her shingles vaccine.   Past Medical History:  Diagnosis Date   Allergic rhinitis    Arthritis    joints   BMI 45.0-49.9, adult ([facility]) 01/30/2017   Hypercholesteremia    Hypertension    controlled on medicine   Motion sickness    Obesity    PONV (postoperative nausea and vomiting)    Wears partial dentures    lower   Past Surgical History:  Procedure Laterality Date   [place]   COLONOSCOPY WITH PROPOFOL N/A 05/23/2019   Procedure: COLONOSCOPY WITH PROPOFOL;  Surgeon: Jonathon Bellows, MD;  Location: [facility];  Service: Endoscopy;  Laterality: N/A;   CYSTOSCOPY  04/04/2017   Procedure: CYSTOSCOPY;  Surgeon: Gae Dry, MD;  Location: [facility];  Service: Gynecology;;   JOINT REPLACEMENT     left knee   POLYPECTOMY  05/23/2019   Procedure: POLYPECTOMY;  Surgeon: Jonathon Bellows, MD;  Location: [facility];  Service: Endoscopy;;   REPLACEMENT TOTAL KNEE Left 01/02/2013   TOTAL LAPAROSCOPIC HYSTERECTOMY WITH SALPINGECTOMY Bilateral 04/04/2017   Procedure: HYSTERECTOMY TOTAL LAPAROSCOPIC WITH SALPINGECTOMY;  Surgeon: Gae Dry, MD;  Location: [facility];  Service: Gynecology;  Laterality: Bilateral;   VAGINAL BIRTH AFTER CESAREAN SECTION     Social History   Socioeconomic History   Marital status: Married    Spouse name: Karen Morrison   Number of children: 2   Years of education: HS   Highest education level: Not on file  Occupational History   Not on file  Tobacco Use   Smoking status: Never   Smokeless tobacco: Never  Vaping Use   Vaping Use: Never used  Substance and Sexual Activity   Alcohol use: No    Alcohol/week: 0.0 standard drinks   Drug use: No   Sexual activity: Yes    Birth control/protection: Pill, None  Other Topics Concern   Not on file  Social  History Narrative   Not on file   Social Determinants of Health   Financial Resource Strain: Not on file  Food Insecurity: Not on file  Transportation Needs: Not on file  Physical Activity: Not on file  Stress: Not on file  Social Connections: Not on file  Intimate Partner Violence: Not on file   Family Status  Relation Name Status   Mother Karen Morrison Deceased at age 35   Pat 70  [place]   MGF  Deceased   PGF  Deceased   [place]  Deceased   Father  Alive   Sister  Alive   Daughter  34   Son  Alive   Sister  Alive   Family  History  Problem Relation Age of Onset   Diabetes Mother        Type 2   Breast cancer Paternal Aunt        22s?   Alzheimer's disease Maternal Grandmother    Heart attack Maternal Grandfather    Emphysema Paternal Grandfather    Healthy Father    Healthy Sister    Healthy Daughter    Healthy Son    Healthy Sister    Allergies  Allergen Reactions   Hydrocodone-Acetaminophen Nausea And Vomiting   Amoxicillin Hives, Itching and Rash   Apple Cider Vinegar Rash   Azithromycin Hives, Itching and Rash   Doxycycline Rash   Penicillins Hives, Itching and Rash    Has patient had a PCN reaction causing immediate rash, facial/tongue/throat swelling, SOB or lightheadedness with hypotension: Yes Has patient had a PCN reaction causing severe rash involving mucus membranes or skin necrosis: Yes Has patient had a PCN reaction that required hospitalization: No Has patient had a PCN reaction occurring within the last 10 years: No If all of the above answers are "NO", then may proceed with Cephalosporin use.     Patient Care Team: Gwyneth Sprout, FNP as PCP - General (Family Medicine)   Medications: Outpatient Medications Prior to Visit  Medication Sig   acetaminophen (TYLENOL) 325 MG tablet Take 650 mg by mouth every 6 (six) hours as needed.   diphenhydrAMINE (BENADRYL) 25 MG tablet Take 25 mg by mouth daily as needed for itching (hives).   ibuprofen (ADVIL,MOTRIN) 200 MG tablet Take 200 mg by mouth daily as needed for headache or moderate pain.   [DISCONTINUED] hydrochlorothiazide (HYDRODIURIL) 25 MG tablet Take 1 tablet (25 mg total) by mouth daily.   [DISCONTINUED] meloxicam (MOBIC) 7.5 MG tablet Take 1 tablet (7.5 mg total) by mouth daily. TAKE 1 TABLET(7.5 MG) BY MOUTH DAILY   [DISCONTINUED] Multiple Vitamins-Minerals (EMERGEN-C IMMUNE PO) Take 2 tablets by mouth daily. (Patient not taking: No sig reported)   [DISCONTINUED] Multiple Vitamins-Minerals (HAIR SKIN AND NAILS FORMULA) TABS  Take 1 tablet by mouth daily. (Patient not taking: No sig reported)   No facility-administered medications prior to visit.    Review of Systems  Constitutional: Negative.  Negative for chills, fatigue and fever.  HENT: Negative.  Negative for ear pain, sinus pressure, sinus pain and sore throat.   Eyes: Negative.  Negative for pain and visual disturbance.  Respiratory: Negative.  Negative for cough, chest tightness, shortness of breath and wheezing.   Cardiovascular:  Positive for leg swelling (mild). Negative for chest pain and palpitations.  Gastrointestinal: Negative.  Negative for abdominal pain, blood in stool, diarrhea, nausea and vomiting.  Endocrine: Negative.   Genitourinary: Negative.  Negative for dysuria, flank pain,  frequency, pelvic pain and urgency.  Musculoskeletal:  Positive for arthralgias. Negative for back pain, myalgias and neck pain.  Neurological:  Negative for dizziness, weakness, light-headedness, numbness and headaches.     Objective    BP 135/74   Pulse (!) 110   Temp 98.1 F (36.7 C)   Resp 16   Ht 5\' 5"  (1.651 m)   Wt 295 lb (133.8 kg)   LMP 03/16/2017 (Exact Date)   BMI 49.09 kg/m    Physical Exam Vitals reviewed.  Constitutional:      General: She is not in acute distress.    Appearance: Normal appearance. She is well-developed. She is not diaphoretic.  HENT:     Head: Normocephalic and atraumatic.     Right Ear: Tympanic membrane, ear canal and external ear normal.     Left Ear: Tympanic membrane, ear canal and external ear normal.     Nose: Nose normal.     Mouth/Throat:     Mouth: Mucous membranes are moist.     Pharynx: Oropharynx is clear. No oropharyngeal exudate.  Eyes:     General: No scleral icterus.    Conjunctiva/sclera: Conjunctivae normal.     Pupils: Pupils are equal, round, and reactive to light.  Neck:     Thyroid: No thyromegaly.  Cardiovascular:     Rate and Rhythm: Normal rate and regular rhythm.     Pulses: Normal  pulses.     Heart sounds: Normal heart sounds. No murmur heard. Pulmonary:     Effort: Pulmonary effort is normal. No respiratory distress.     Breath sounds: Normal breath sounds. No wheezing or rales.  Abdominal:     General: There is no distension.     Palpations: Abdomen is soft.     Tenderness: There is no abdominal tenderness.  Musculoskeletal:        General: Swelling (mild) present. No deformity.     Cervical back: Neck supple.     Right lower leg: No edema.     Left lower leg: No edema.  Lymphadenopathy:     Cervical: No cervical adenopathy.  Skin:    General: Skin is warm and dry.     Findings: No rash.  Neurological:     Mental Status: She is alert and oriented to person, place, and time. Mental status is at baseline.     Sensory: No sensory deficit.     Motor: No weakness.     Gait: Gait normal.  Psychiatric:        Mood and Affect: Mood normal.        Behavior: Behavior normal.        Thought Content: Thought content normal.   Last depression screening scores PHQ 2/9 Scores 04/13/2021 04/03/2020 04/01/2019  PHQ - 2 Score 0 0 2  PHQ- 9 Score 5 3 4    Last fall risk screening Fall Risk  04/13/2021  Falls in the past year? 0  Number falls in past yr: 0  Injury with Fall? 0  Risk for fall due to : No Fall Risks  Follow up Falls evaluation completed   Last Audit-C alcohol use screening Alcohol Use Disorder Test (AUDIT) 04/13/2021  1. How often do you have a drink containing alcohol? 1  2. How many drinks containing alcohol do you have on a typical day when you are drinking? 0  3. How often do you have six or more drinks on one occasion? 0  AUDIT-C Score 1  Alcohol Brief  Interventions/Follow-up -   A score of 3 or more in women, and 4 or more in men indicates increased risk for alcohol abuse, EXCEPT if all of the points are from question 1   No results found for any visits on 04/13/21.  Assessment & Plan     Problem List Items Addressed This Visit        Cardiovascular and Mediastinum   Essential hypertension    Well controlled Continue current medications Recheck metabolic panel       Relevant Medications   hydrochlorothiazide (HYDRODIURIL) 25 MG tablet     Other   Hypercholesteremia    Reviewed last lipid panel Not currently on a statin Recheck FLP and CMP Discussed diet and exercise        Relevant Medications   hydrochlorothiazide (HYDRODIURIL) 25 MG tablet   Other Relevant Orders   Lipid panel   Morbid obesity ([place])    Discussed importance of healthy weight management Discussed diet and exercise  Will try 4m of phentermine - discussed risks       Relevant Medications   phentermine 37.5 MG capsule   Other Visit Diagnoses     Annual physical exam    -  Primary   Relevant Orders   CBC with Differential/Platelet   Comprehensive metabolic panel   Hemoglobin A1c   TSH   BMI 45.0-49.9, adult ([facility])       Relevant Medications   phentermine 37.5 MG capsule   Primary osteoarthritis of both knees       Relevant Medications   meloxicam (MOBIC) 7.5 MG tablet   Need for shingles vaccine       Relevant Orders   Varicella-zoster vaccine IM (Shingrix) (Completed)       Routine Health Maintenance and Physical Exam  Exercise Activities and Dietary recommendations  Goals   None     Immunization History  Administered Date(s) Administered   Influenza Inj Mdck Quad Pf 08/04/2017   Influenza Split 07/06/2012, 08/26/2013   Influenza,inj,Quad PF,6+ Mos 08/15/2018, 07/12/2019   Moderna SARS-COV2 Booster Vaccination 10/02/2020   PFIZER(Purple Top)SARS-COV-2 Vaccination 12/26/2019, 01/16/2020   Td 07/19/2005   Tdap 01/23/2015   Zoster Recombinat (Shingrix) 04/13/2021    Health Maintenance  Topic Date Due   COVID-19 Vaccine (4 - Booster for Pfizer series) 01/31/2021   INFLUENZA VACCINE  05/17/2021   Zoster Vaccines- Shingrix (2 of 2) 06/08/2021   MAMMOGRAM  12/02/2022   COLONOSCOPY (Pts 45-64yrs Insurance  coverage will need to be confirmed)  05/22/2024   TETANUS/TDAP  01/22/2025   Hepatitis C Screening  Completed   HIV Screening  Completed   Pneumococcal Vaccine 23-57 Years old  Aged Out   HPV VACCINES  Aged Out    Discussed health benefits of physical activity, and encouraged her to engage in regular exercise appropriate for her age and condition.  Return in about 3 months (around 07/14/2021) for chronic disease f/u.     I,Essence Turner,acting as a Education administrator for Lavon Paganini, MD.,have documented all relevant documentation on the behalf of Lavon Paganini, MD,as directed by  Lavon Paganini, MD while in the presence of Lavon Paganini, MD.   I, Lavon Paganini, MD, have reviewed all documentation for this visit. The documentation on 04/13/21 for the exam, diagnosis, procedures, and orders are all accurate and complete.   Carolee Channell, Dionne Bucy, MD, MPH [facility]

## 2021-04-14 LAB — LIPID PANEL
Chol/HDL Ratio: 3.7 ratio (ref 0.0–4.4)
Cholesterol, Total: 229 mg/dL — ABNORMAL HIGH (ref 100–199)
HDL: 62 mg/dL (ref >39)
LDL Chol Calc (NIH): 149 mg/dL — ABNORMAL HIGH (ref 0–99)
Triglycerides: 103 mg/dL (ref 0–149)
VLDL Cholesterol Cal: 18 mg/dL (ref 5–40)

## 2021-04-14 LAB — COMPREHENSIVE METABOLIC PANEL
ALT: 21 IU/L (ref 0–32)
AST: 20 IU/L (ref 0–40)
Albumin/Globulin Ratio: 1.7 (ref 1.2–2.2)
Albumin: 4.3 g/dL (ref 3.8–4.9)
Alkaline Phosphatase: 124 IU/L — ABNORMAL HIGH (ref 44–121)
BUN/Creatinine Ratio: 14 (ref 9–23)
BUN: 10 mg/dL (ref 6–24)
Bilirubin Total: 0.4 mg/dL (ref 0.0–1.2)
CO2: 26 mmol/L (ref 20–29)
Calcium: 9.4 mg/dL (ref 8.7–10.2)
Chloride: 98 mmol/L (ref 96–106)
Creatinine, Ser: 0.7 mg/dL (ref 0.57–1.00)
Globulin, Total: 2.6 g/dL (ref 1.5–4.5)
Glucose: 91 mg/dL (ref 65–99)
Potassium: 3.9 mmol/L (ref 3.5–5.2)
Sodium: 141 mmol/L (ref 134–144)
Total Protein: 6.9 g/dL (ref 6.0–8.5)
eGFR: 105 mL/min/{1.73_m2} (ref >59)

## 2021-04-14 LAB — CBC WITH DIFFERENTIAL/PLATELET
Basophils Absolute: 0.1 10*3/uL (ref 0.0–0.2)
Basos: 1 %
EOS (ABSOLUTE): 0.2 10*3/uL (ref 0.0–0.4)
Eos: 2 %
Hematocrit: 44.3 % (ref 34.0–46.6)
Hemoglobin: 14.9 g/dL (ref 11.1–15.9)
Immature Grans (Abs): 0 10*3/uL (ref 0.0–0.1)
Immature Granulocytes: 1 %
Lymphocytes Absolute: 2.5 10*3/uL (ref 0.7–3.1)
Lymphs: 30 %
MCH: 28.4 pg (ref 26.6–33.0)
MCHC: 33.6 g/dL (ref 31.5–35.7)
MCV: 85 fL (ref 79–97)
Monocytes Absolute: 0.7 10*3/uL (ref 0.1–0.9)
Monocytes: 8 %
Neutrophils Absolute: 4.8 10*3/uL (ref 1.4–7.0)
Neutrophils: 58 %
Platelets: 298 10*3/uL (ref 150–450)
RBC: 5.24 x10E6/uL (ref 3.77–5.28)
RDW: 13.8 % (ref 11.7–15.4)
WBC: 8.2 10*3/uL (ref 3.4–10.8)

## 2021-04-14 LAB — TSH: TSH: 3.09 u[IU]/mL (ref 0.450–4.500)

## 2021-04-14 LAB — HEMOGLOBIN A1C
Est. average glucose Bld gHb Est-mCnc: 117 mg/dL
Hgb A1c MFr Bld: 5.7 % — ABNORMAL HIGH (ref 4.8–5.6)

## 2021-04-15 ENCOUNTER — Telehealth: Payer: Self-pay

## 2021-04-15 NOTE — Telephone Encounter (Signed)
PA Outcome Approvedtoday Request Reference Number: [id number]. PHENTERMINE CAP 37.5MG  is approved through 10/15/2021. Your patient may now fill this prescription and it will be covered. Drug Phentermine HCl 37.5MG  capsulesdone today.

## 2021-04-23 ENCOUNTER — Telehealth: Payer: Self-pay

## 2021-04-23 NOTE — Telephone Encounter (Signed)
Schedule booster her or with her pharmacy.

## 2021-04-23 NOTE — Telephone Encounter (Signed)
Copied from [place] [id number]. Topic: Appointment Scheduling - Scheduling Inquiry for Clinic >> Apr 23, 2021  8:45 AM Tessa Lerner A wrote: Reason for CRM: Patient would like to schedule an appt with Prov. Rollene Rotunda  Patient would like to schedule the follow up booster of their shingles vaccine  Please contact to advise

## 2021-04-25 DIAGNOSIS — M1711 Unilateral primary osteoarthritis, right knee: Secondary | ICD-10-CM | POA: Insufficient documentation

## 2021-04-25 DIAGNOSIS — M170 Bilateral primary osteoarthritis of knee: Secondary | ICD-10-CM | POA: Insufficient documentation

## 2021-05-18 ENCOUNTER — Other Ambulatory Visit: Payer: Self-pay | Admitting: Physician Assistant

## 2021-05-18 DIAGNOSIS — M170 Bilateral primary osteoarthritis of knee: Secondary | ICD-10-CM

## 2021-05-19 NOTE — Telephone Encounter (Signed)
Please review. Ok to refill?  

## 2021-07-11 NOTE — Discharge Instructions (Signed)
Instructions after Total Knee Replacement   Teandre Hamre P. Demaris Leavell, Jr., M.D.     Dept. of Orthopaedics & Sports Medicine  [facility]  [address]  [place], [place]  [address]  Phone: [phone]   Fax: [phone]    DIET: Drink plenty of non-alcoholic fluids. Resume your normal diet. Include foods high in fiber.  ACTIVITY:  You may use crutches or a walker with weight-bearing as tolerated, unless instructed otherwise. You may be weaned off of the walker or crutches by your Physical Therapist.  Do NOT place pillows under the knee. Anything placed under the knee could limit your ability to straighten the knee.   Continue doing gentle exercises. Exercising will reduce the pain and swelling, increase motion, and prevent muscle weakness.   Please continue to use the TED compression stockings for 6 weeks. You may remove the stockings at night, but should reapply them in the morning. Do not drive or operate any equipment until instructed.  WOUND CARE:  Continue to use the PolarCare or ice packs periodically to reduce pain and swelling. You may bathe or shower after the staples are removed at the first office visit following surgery.  MEDICATIONS: You may resume your regular medications. Please take the pain medication as prescribed on the medication. Do not take pain medication on an empty stomach. You have been given a prescription for a blood thinner (Lovenox or Coumadin). Please take the medication as instructed. (NOTE: After completing a 2 week course of Lovenox, take one Enteric-coated aspirin once a day. This along with elevation will help reduce the possibility of phlebitis in your operated leg.) Do not drive or drink alcoholic beverages when taking pain medications.  CALL THE OFFICE FOR: Temperature above 101 degrees Excessive bleeding or drainage on the dressing. Excessive swelling, coldness, or paleness of the toes. Persistent nausea and vomiting.  FOLLOW-UP:  You  should have an appointment to return to the office in 10-14 days after surgery. Arrangements have been made for continuation of Physical Therapy (either home therapy or outpatient therapy).   [facility] Department Directory         [email or web address]       [email or web address]          Cardiology  Appointments: [place] - [phone] [place] - [phone]  Endocrinology  Appointments: [place] - [phone] [place] - [phone]  Gastroenterology  Appointments: [place] - [phone] [place] - [phone]        General Surgery   Appointments: [place] - [phone]  Internal Medicine/Family Medicine  Appointments: [place] - [phone] [place] - [phone] [place] - [phone]  Metabolic and Weigh Loss Surgery  Appointments: [place] - [phone]        Neurology  Appointments: [place] - [phone] [place] - [phone]  Neurosurgery  Appointments: [place] - [phone]  Obstetrics & Gynecology  Appointments: [place] - [phone] [place] - [phone]        Pediatrics  Appointments: [place] - [phone] [place] - [phone]  Physiatry  Appointments: [place] -[phone]  Physical Therapy  Appointments: [place] - [phone] [place] - [phone]        Podiatry  Appointments: [place] - [phone] [place] - [phone]  Pulmonology  Appointments: [place] - [phone]  Rheumatology  Appointments: [place] - [phone]        [place] Location: [facility]  [address] [place], [place]  [address]  [place] Location: [facility] [address] [place], [place]  [address]  [place] Location: [facility] [address] [place], [place]  [address]    

## 2021-07-14 ENCOUNTER — Ambulatory Visit: Payer: Managed Care, Other (non HMO) | Admitting: Family Medicine

## 2021-07-14 ENCOUNTER — Other Ambulatory Visit: Payer: Self-pay

## 2021-07-14 ENCOUNTER — Encounter: Payer: Self-pay | Admitting: Family Medicine

## 2021-07-14 DIAGNOSIS — R7303 Prediabetes: Secondary | ICD-10-CM | POA: Insufficient documentation

## 2021-07-14 DIAGNOSIS — Z713 Dietary counseling and surveillance: Secondary | ICD-10-CM | POA: Diagnosis not present

## 2021-07-14 DIAGNOSIS — I10 Essential (primary) hypertension: Secondary | ICD-10-CM | POA: Diagnosis not present

## 2021-07-14 MED ORDER — METFORMIN HCL ER 750 MG PO TB24: 750.0000 mg | ORAL_TABLET | Freq: Every day | ORAL | 1 refills | Status: DC

## 2021-07-14 NOTE — Assessment & Plan Note (Signed)
Pt encouraged by weight loss; does not wish to have bariatric sx

## 2021-07-14 NOTE — Assessment & Plan Note (Signed)
Trail of metformin xr, now off phen.

## 2021-07-14 NOTE — Assessment & Plan Note (Signed)
Has seen 30# weight loss on Rx over 3 months; switch to metformin given pre-diabetes -htn -hld Also using [employer] and water aerobics

## 2021-07-14 NOTE — Assessment & Plan Note (Signed)
Chronic, stable Edema noted in RLE d/t joint failure- planned sx

## 2021-07-14 NOTE — Progress Notes (Signed)
Established patient visit   Patient: Karen Morrison   DOB: March 18, 1971   50 y.o. Female  MRN: [id number] Visit Date: 07/14/2021  Today's healthcare provider: Gwyneth Sprout, FNP   Chief Complaint  Patient presents with   Obesity   Hypertension   Hyperlipidemia   Subjective    HPI  Follow up for Obesity  The patient was last seen for this 3 months ago. Changes made at last visit include started patient on Phentermine 37.69m.  She reports excellent compliance with treatment. She feels that condition is Improved. She is not having side effects.  Patient states that she has lost 30lbs since last visit and is actively exercising 5x a week doing water aerobics. Patient states that she has signed up for weight watchers and is continuing to lose weight.  -----------------------------------------------------------------------------------------  Hypertension, follow-up  BP Readings from Last 3 Encounters:  07/14/21 137/84  04/13/21 135/74  04/03/20 115/82   Wt Readings from Last 3 Encounters:  07/14/21 266 lb 11.2 oz (121 kg)  04/13/21 295 lb (133.8 kg)  04/03/20 289 lb 9.6 oz (131.4 kg)     She was last seen for hypertension 3 months ago.  BP at that visit was 135/74. Management since that visit includes none.  She reports excellent compliance with treatment. She is not having side effects.  She is following a Regular diet. She is exercising. She does not smoke.  Use of agents associated with hypertension: none.   Outside blood pressures are not currently checked. Symptoms: No chest pain No chest pressure  No palpitations No syncope  No dyspnea No orthopnea  No paroxysmal nocturnal dyspnea No lower extremity edema   Pertinent labs: Lab Results  Component Value Date   CHOL 229 (H) 04/13/2021   HDL 62 04/13/2021   LDLCALC 149 (H) 04/13/2021   TRIG 103 04/13/2021   CHOLHDL 3.7 04/13/2021   Lab Results  Component Value Date   NA 141 04/13/2021   K 3.9  04/13/2021   CREATININE 0.70 04/13/2021   EGFR 105 04/13/2021   GFRNONAA 95 04/03/2020   GLUCOSE 91 04/13/2021     The 10-year ASCVD risk score (Arnett DK, et al., 2019) is: 2.4%   ---------------------------------------------------------------------------------------------------  Lipid/Cholesterol, Follow-up  Last lipid panel Other pertinent labs  Lab Results  Component Value Date   CHOL 229 (H) 04/13/2021   HDL 62 04/13/2021   LDLCALC 149 (H) 04/13/2021   TRIG 103 04/13/2021   CHOLHDL 3.7 04/13/2021   Lab Results  Component Value Date   ALT 21 04/13/2021   AST 20 04/13/2021   PLT 298 04/13/2021   TSH 3.090 04/13/2021     She was last seen for this 3 months ago.  Management since that visit includes none.  Symptoms: No chest pain No chest pressure/discomfort  No dyspnea No lower extremity edema  No numbness or tingling of extremity No orthopnea  No palpitations No paroxysmal nocturnal dyspnea  No speech difficulty No syncope   Current diet: in general, a "healthy" diet   Current exercise:  water aerobics   The 10-year ASCVD risk score (Arnett DK, et al., 2019) is: 2.4%  ---------------------------------------------------------------------------------------------------   Medications: Outpatient Medications Prior to Visit  Medication Sig   acetaminophen (TYLENOL) 325 MG tablet Take 650 mg by mouth every 6 (six) hours as needed (pain).   diphenhydrAMINE (BENADRYL) 25 MG tablet Take 25 mg by mouth daily as needed for itching (hives).   hydrochlorothiazide (HYDRODIURIL) 25 MG  tablet Take 1 tablet (25 mg total) by mouth daily.   meloxicam (MOBIC) 7.5 MG tablet TAKE 1 TABLET(7.5 MG) BY MOUTH DAILY   phentermine 37.5 MG capsule Take 1 capsule (37.5 mg total) by mouth every morning.   Psyllium (METAMUCIL FIBER) 51.7 % PACK Take 1 packet by mouth in the morning.   senna (SENOKOT) 8.6 MG tablet Take 1 tablet by mouth daily as needed for constipation.   No  facility-administered medications prior to visit.    Review of Systems     Objective    BP 137/84   Temp 97.9 F (36.6 C) (Oral)   Resp 16   Wt 266 lb 11.2 oz (121 kg)   LMP 03/16/2017 (Exact Date)   SpO2 97%   BMI 44.38 kg/m  {Show previous vital signs (optional):23777}  Physical Exam Vitals and nursing note reviewed.  Constitutional:      General: She is not in acute distress.    Appearance: Normal appearance. She is obese. She is not ill-appearing, toxic-appearing or diaphoretic.  HENT:     Head: Normocephalic and atraumatic.  Cardiovascular:     Rate and Rhythm: Normal rate and regular rhythm.     Pulses: Normal pulses.     Heart sounds: Normal heart sounds. No murmur heard.   No friction rub. No gallop.  Pulmonary:     Effort: Pulmonary effort is normal. No respiratory distress.     Breath sounds: Normal breath sounds. No stridor. No wheezing, rhonchi or rales.  Chest:     Chest wall: No tenderness.  Abdominal:     General: Bowel sounds are normal.     Palpations: Abdomen is soft.  Musculoskeletal:        General: Swelling, tenderness and deformity present. No signs of injury. Normal range of motion.     Right lower leg: Swelling, deformity and tenderness present. No edema.     Left lower leg: No edema.       Legs:     Comments: Displaced R knee joint; upcoming knee sx in 2 wks  Skin:    General: Skin is warm and dry.     Capillary Refill: Capillary refill takes less than 2 seconds.     Coloration: Skin is not jaundiced or pale.     Findings: No bruising, erythema, lesion or rash.  Neurological:     General: No focal deficit present.     Mental Status: She is alert and oriented to person, place, and time. Mental status is at baseline.     Cranial Nerves: No cranial nerve deficit.     Sensory: No sensory deficit.     Motor: No weakness.     Coordination: Coordination normal.  Psychiatric:        Mood and Affect: Mood normal.        Behavior: Behavior  normal.        Thought Content: Thought content normal.        Judgment: Judgment normal.     No results found for any visits on 07/14/21.  Assessment & Plan     Problem List Items Addressed This Visit       Cardiovascular and Mediastinum   Essential hypertension    Chronic, stable Edema noted in RLE d/t joint failure- planned sx        Other   Morbid obesity ([place]) - Primary    Has seen 30# weight loss on Rx over 3 months; switch to metformin given pre-diabetes -htn -hld  Also using [employer] and water aerobics      Relevant Medications   metFORMIN (GLUCOPHAGE-XR) 750 MG 24 hr tablet   Pre-diabetes    Trail of metformin xr, now off phen.      Relevant Medications   metFORMIN (GLUCOPHAGE-XR) 750 MG 24 hr tablet   Weight loss counseling, encounter for    Pt encouraged by weight loss; does not wish to have bariatric sx        Return in about 3 months (around 10/13/2021) for chonic disease management.      Vonna Kotyk, FNP, have reviewed all documentation for this visit. The documentation on 07/14/21 for the exam, diagnosis, procedures, and orders are all accurate and complete.    Gwyneth Sprout, [place] [phone] (phone) [phone] (fax)  [place]

## 2021-07-15 ENCOUNTER — Encounter
Admission: RE | Admit: 2021-07-15 | Discharge: 2021-07-15 | Disposition: A | Discharge status: Home / Self Care | Payer: Managed Care, Other (non HMO) | Source: Ambulatory Visit | Attending: Orthopedic Surgery | Admitting: Orthopedic Surgery

## 2021-07-15 DIAGNOSIS — Z01810 Encounter for preprocedural cardiovascular examination: Secondary | ICD-10-CM

## 2021-07-15 DIAGNOSIS — M1711 Unilateral primary osteoarthritis, right knee: Secondary | ICD-10-CM | POA: Insufficient documentation

## 2021-07-15 DIAGNOSIS — Z01818 Encounter for other preprocedural examination: Secondary | ICD-10-CM | POA: Diagnosis not present

## 2021-07-15 HISTORY — DX: Anemia, unspecified: D64.9

## 2021-07-15 HISTORY — DX: Family history of other specified conditions: Z84.89

## 2021-07-15 HISTORY — DX: Prediabetes: R73.03

## 2021-07-15 LAB — PROTIME-INR
INR: 1 (ref 0.8–1.2)
Prothrombin Time: 13.3 seconds (ref 11.4–15.2)

## 2021-07-15 LAB — CBC
HCT: 46.4 % — ABNORMAL HIGH (ref 36.0–46.0)
Hemoglobin: 15.2 g/dL — ABNORMAL HIGH (ref 12.0–15.0)
MCH: 28.3 pg (ref 26.0–34.0)
MCHC: 32.8 g/dL (ref 30.0–36.0)
MCV: 86.2 fL (ref 80.0–100.0)
Platelets: 293 10*3/uL (ref 150–400)
RBC: 5.38 MIL/uL — ABNORMAL HIGH (ref 3.87–5.11)
RDW: 14.4 % (ref 11.5–15.5)
WBC: 7.6 10*3/uL (ref 4.0–10.5)
nRBC: 0 % (ref 0.0–0.2)

## 2021-07-15 LAB — URINALYSIS, ROUTINE W REFLEX MICROSCOPIC
Bilirubin Urine: NEGATIVE
Glucose, UA: NEGATIVE mg/dL
Hgb urine dipstick: NEGATIVE
Ketones, ur: NEGATIVE mg/dL
Leukocytes,Ua: NEGATIVE
Nitrite: NEGATIVE
Protein, ur: NEGATIVE mg/dL
Specific Gravity, Urine: 1.003 — ABNORMAL LOW (ref 1.005–1.030)
pH: 7 (ref 5.0–8.0)

## 2021-07-15 LAB — COMPREHENSIVE METABOLIC PANEL
ALT: 27 U/L (ref 0–44)
AST: 26 U/L (ref 15–41)
Albumin: 4.2 g/dL (ref 3.5–5.0)
Alkaline Phosphatase: 96 U/L (ref 38–126)
Anion gap: 8 (ref 5–15)
BUN: 8 mg/dL (ref 6–20)
CO2: 31 mmol/L (ref 22–32)
Calcium: 9.2 mg/dL (ref 8.9–10.3)
Chloride: 99 mmol/L (ref 98–111)
Creatinine, Ser: 0.71 mg/dL (ref 0.44–1.00)
GFR, Estimated: >60 mL/min (ref >60)
Glucose, Bld: 100 mg/dL — ABNORMAL HIGH (ref 70–99)
Potassium: 3.3 mmol/L — ABNORMAL LOW (ref 3.5–5.1)
Sodium: 138 mmol/L (ref 135–145)
Total Bilirubin: 0.9 mg/dL (ref 0.3–1.2)
Total Protein: 7.4 g/dL (ref 6.5–8.1)

## 2021-07-15 LAB — TYPE AND SCREEN
ABO/RH(D): A POS
Antibody Screen: NEGATIVE

## 2021-07-15 LAB — C-REACTIVE PROTEIN: CRP: 2.2 mg/dL — ABNORMAL HIGH (ref <1.0)

## 2021-07-15 LAB — SURGICAL PCR SCREEN
MRSA, PCR: NEGATIVE
Staphylococcus aureus: NEGATIVE

## 2021-07-15 LAB — SEDIMENTATION RATE: Sed Rate: 5 mm/hr (ref 0–30)

## 2021-07-15 LAB — APTT: aPTT: 30 seconds (ref 24–36)

## 2021-07-15 NOTE — Patient Instructions (Addendum)
Your procedure is scheduled on:07-26-21 Monday Report to the Registration Desk on the 1st floor of the [facility].Then proceed to the 2nd floor Surgery Desk in the [facility] To find out your arrival time, please call [phone] between 1PM - 3PM on:07-23-21 Friday  REMEMBER: Instructions that are not followed completely may result in serious medical risk, up to and including death; or upon the discretion of your surgeon and anesthesiologist your surgery may need to be rescheduled.  Do not eat food after midnight the night before surgery.  No gum chewing, lozengers or hard candies.  You may however, drink CLEAR liquids up to 2 hours before you are scheduled to arrive for your surgery. Do not drink anything within 2 hours of your scheduled arrival time.  Clear liquids include: - water  - apple juice without pulp - gatorade (not RED, PURPLE, OR BLUE) - black coffee or tea (Do NOT add milk or creamers to the coffee or tea) Do NOT drink anything that is not on this list.  In addition, your doctor has ordered for you to drink the provided  Ensure Pre-Surgery Clear Carbohydrate Drink  Drinking this carbohydrate drink up to two hours before surgery helps to reduce insulin resistance and improve patient outcomes. Please complete drinking 2 hours prior to scheduled arrival time.  Do NOT take any medication the day of surgery  Stop Phentermine 7 days prior to surgery-Last dose on 07-18-21 (Sunday)  One week prior to surgery: (Last dose on 07-18-21) Stop Anti-inflammatories (NSAIDS) such as Mobic (Meloxicam), Advil, Aleve, Ibuprofen, Motrin, Naproxen, Naprosyn and Aspirin based products such as Excedrin, Goodys Powder, BC Powder.You may however, continue to take Tylenol if needed for pain up until the day of surgery.  Stop ANY OVER THE COUNTER supplement/vitamins 7 days prior to surgery   No Alcohol for 24 hours before or after surgery.  No Smoking including e-cigarettes for 24 hours  prior to surgery.  No chewable tobacco products for at least 6 hours prior to surgery.  No nicotine patches on the day of surgery.  Do not use any "recreational" drugs for at least a week prior to your surgery.  Please be advised that the combination of cocaine and anesthesia may have negative outcomes, up to and including death. If you test positive for cocaine, your surgery will be cancelled.  On the morning of surgery brush your teeth with toothpaste and water, you may rinse your mouth with mouthwash if you wish. Do not swallow any toothpaste or mouthwash.  Use CHG Soap as directed on instruction sheet.  Do not wear jewelry, make-up, hairpins, clips or nail polish.  Do not wear lotions, powders, or perfumes.   Do not shave body from the neck down 48 hours prior to surgery just in case you cut yourself which could leave a site for infection.  Also, freshly shaved skin may become irritated if using the CHG soap.  Contact lenses, hearing aids and dentures may not be worn into surgery.  Do not bring valuables to the hospital. [facility] is not responsible for any missing/lost belongings or valuables.   Notify your doctor if there is any change in your medical condition (cold, fever, infection).  Wear comfortable clothing (specific to your surgery type) to the hospital.  After surgery, you can help prevent lung complications by doing breathing exercises.  Take deep breaths and cough every 1-2 hours. Your doctor may order a device called an Incentive Spirometer to help you take deep breaths.  When coughing or sneezing, hold a pillow firmly against your incision with both hands. This is called "splinting." Doing this helps protect your incision. It also decreases belly discomfort.  If you are being admitted to the hospital overnight, leave your suitcase in the car. After surgery it may be brought to your room.  If you are being discharged the day of surgery, you will not be allowed to  drive home. You will need a responsible adult (18 years or older) to drive you home and stay with you that night.   If you are taking public transportation, you will need to have a responsible adult (18 years or older) with you. Please confirm with your physician that it is acceptable to use public transportation.   Please call the [facility] Dept. at [phone] if you have any questions about these instructions.  Surgery Visitation Policy:  Patients undergoing a surgery or procedure may have one family member or support person with them as long as that person is not COVID-19 positive or experiencing its symptoms.  That person may remain in the waiting area during the procedure and may rotate out with other people.  Inpatient Visitation:    Visiting hours are 7 a.m. to 8 p.m. Up to two visitors ages 16+ are allowed at one time in a patient room. The visitors may rotate out with other people during the day. Visitors must check out when they leave, or other visitors will not be allowed. One designated support person may remain overnight. The visitor must pass COVID-19 screenings, use hand sanitizer when entering and exiting the patient's room and wear a mask at all times, including in the patient's room. Patients must also wear a mask when staff or their visitor are in the room. Masking is required regardless of vaccination status.

## 2021-07-16 NOTE — Progress Notes (Signed)
  Perioperative Services Pre-Admission/Anesthesia Testing    Date: 07/16/21  Name: Karen Morrison MRN:   [id number]  Re: EKG review  Planned Surgical Procedure(s):    Case: [id number] Date/Time: 07/26/21 0700   Procedure: COMPUTER ASSISTED TOTAL KNEE ARTHROPLASTY (Right: Knee)   Anesthesia type: Choice   Pre-op diagnosis: Primary osteoarthritis of right knee   Location: [facility] OR ROOM 01 / [place] [facility]   Surgeons: Dereck Leep, MD   Clinical Notes:  Patient is scheduled for the above procedure on 07/26/2021 with Dr. Skip Estimable.  In preparation for her procedure, patient presented to the PAT clinic on 07/15/2021 for labs and ECG testing.  ECG revealed NSR at a rate of 96 bpm.  There was questionable evidence of a previous MI; no previous for comparison. Based on my interpretation, I do not agree with computer generated interpretation. PMH thoroughly reviewed and patient not noted to have a cardiac history.  Tracing reviewed with medicine Caryn Section, MD) who advised no concerns for upcoming surgery.  Additionally, ECG confirmed by cardiology Rockey Situ, MD) as being normal on 07/16/2021 and concerns for MI removed by interpreting cardiologist.   Information communicated to primary attending surgeon. No concerns identified on current ECG tracing. Patient able to proceed with planned surgical intervention as scheduled.   Honor Loh, MSN, APRN, FNP-C, CEN [facility]  Peri-operative Services Nurse Practitioner Phone: [phone] 07/16/21 1:47 PM  NOTE: This note has been prepared using Dragon dictation software. Despite my best ability to proofread, there is always the potential that unintentional transcriptional errors may still occur from this process.

## 2021-07-21 LAB — IGE: IgE (Immunoglobulin E), Serum: 66 IU/mL (ref 6–495)

## 2021-07-22 ENCOUNTER — Other Ambulatory Visit: Payer: Self-pay

## 2021-07-22 ENCOUNTER — Other Ambulatory Visit
Admission: RE | Admit: 2021-07-22 | Discharge: 2021-07-22 | Disposition: A | Discharge status: Home / Self Care | Payer: Managed Care, Other (non HMO) | Source: Ambulatory Visit | Attending: Orthopedic Surgery | Admitting: Orthopedic Surgery

## 2021-07-22 DIAGNOSIS — Z01812 Encounter for preprocedural laboratory examination: Secondary | ICD-10-CM | POA: Diagnosis present

## 2021-07-22 DIAGNOSIS — Z20822 Contact with and (suspected) exposure to covid-19: Secondary | ICD-10-CM | POA: Diagnosis not present

## 2021-07-22 LAB — SARS CORONAVIRUS 2 (TAT 6-24 HRS): SARS Coronavirus 2: NEGATIVE

## 2021-07-25 ENCOUNTER — Encounter: Payer: Self-pay | Admitting: Orthopedic Surgery

## 2021-07-25 NOTE — H&P (Signed)
ORTHOPAEDIC HISTORY & PHYSICAL Gwenlyn Fudge, [place] - 07/20/2021 2:45 PM EDT Formatting of this note is different from the original. [facility] Chief Complaint:   Chief Complaint  Patient presents with   Knee Pain  H & P RIGHT KNEE   History of Present Illness:   Karen Morrison is a 50 y.o. female that presents to clinic today for her preoperative history and evaluation. Patient presents unaccompanied. The patient is scheduled to undergo a right total knee arthroplasty on 07/26/21 by Dr. Marry Guan. Her pain began over a year ago. The pain is located along the medial aspect of the knee. She describes her pain as worse with weightbearing. She reports associated swelling with some giving way of the knee. She denies associated numbness or tingling, denies locking.   The patient's symptoms have progressed to the point that they decrease her quality of life. The patient has previously undergone conservative treatment including NSAIDS and activity modification without adequate control of her symptoms.  Patient does report a penicillin allergy.  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  Patient is prediabetic. Last A1c was 5.7 on 04/13/2021.  Denies significant cardiac history, history of blood clots, or lumbar surgery.  Past Medical, Surgical, Family, Social History, Allergies, Medications:   Past Medical History:  Past Medical History:  Diagnosis Date   Essential hypertension 04/13/2021  Last Assessment & Plan: Formatting of this note might be different from the original. Well controlled Continue current medications Recheck metabolic panel   Hypercholesteremia 11/12/2015   Menometrorrhagia 03/10/2017   Morbid obesity with BMI of 45.0-49.9, adult (CMS-HCC)   Past Surgical History:  Past Surgical History:  Procedure Laterality Date   CESAREAN SECTION 10/17/1992   CHOLECYSTECTOMY 10/17/1993   HYSTERECTOMY   Left  total knee arthroplasty 01/02/2013   Open reduction & internal fixation of right wrist fracture 10/17/1993   Current Medications:  Current Outpatient Medications  Medication Sig Dispense Refill   acetaminophen (TYLENOL) 500 MG tablet Take 500 mg by mouth every 6 (six) hours as needed   hydroCHLOROthiazide (HYDRODIURIL) 25 MG tablet Take 25 mg by mouth once daily   meloxicam (MOBIC) 7.5 MG tablet Take 7.5 mg by mouth once daily   metFORMIN (GLUCOPHAGE-XR) 750 MG XR tablet Take 750 mg by mouth daily with dinner   phentermine (ADIPEX-P) 37.5 MG capsule Take 37.5 mg by mouth every morning before breakfast   psyllium, aspartame, (METAMUCIL FIBER SINGLES) 3.4 gram packet Take 2 packets by mouth once daily   sennosides (SENOKOT) 8.6 mg tablet Take 1 tablet by mouth once daily as needed   No current facility-administered medications for this visit.   Allergies:  Allergies  Allergen Reactions   Amoxicillin Swelling and Rash   Apple Cider Vinegar Rash   Doxycycline Rash   Hydrocodone Nausea and Vomiting   Penicillin Swelling and Rash   Social History:  Social History   Socioeconomic History   Marital status: Married  Spouse name: Ronnie   Number of children: 2   Years of education: 12  Occupational History   Occupation: Full time - Company secretary  Tobacco Use   Smoking status: Never Smoker   Smokeless tobacco: Never Used  Substance and Sexual Activity   Alcohol use: Defer  Alcohol/week: 0.0 standard drinks   Drug use: Defer   Sexual activity: Yes  Partners: Male  Birth control/protection: OCP   Family History:  Family History  Problem Relation Age of Onset  Diabetes Mother   Allergies Mother   Allergies Unknown   Review of Systems:   A 10+ ROS was performed, reviewed, and the pertinent orthopaedic findings are documented in the HPI.   Physical Examination:   BP 118/82 (BP Location: Left upper arm, Patient Position: Sitting, BP Cuff Size: Large Adult)  Ht 165.1 cm (5'  5")  Wt (!) 119.9 kg (264 lb 6.4 oz)  LMP 12/03/2015 Comment: OCP  BMI 44.00 kg/m   Patient is a well-developed, well-nourished female in no acute distress. Patient has normal mood and affect. Patient is alert and oriented to person, place, and time.   HEENT: Atraumatic, normocephalic. Pupils equal and reactive to light. Extraocular motion intact. Noninjected sclera.  Cardiovascular: Tachycardic rate with regular rhythm, no murmurs, rubs, or gallops. Distal pulses auscultated with Doppler  Respiratory: Lungs clear to auscultation bilaterally.   Right Knee: Soft tissue swelling: mild Effusion: minimal Erythema: none Crepitance: mild Tenderness: medial Alignment: relative varus Mediolateral laxity: medial pseudolaxity Posterior sag: negative Patellar tracking: Good tracking without evidence of subluxation or tilt Atrophy: No significant atrophy.  Quadriceps tone was fair to good. Range of motion: 0/20/115 degrees  Sensation intact over the saphenous, lateral sural cutaneous, superficial fibular, and deep fibular nerve distributions.  Tests Performed/Reviewed:  X-rays  3 views of the right knee were obtained. Images reveal severe loss of medial compartment joint space with significant osteophyte formation. No fractures or dislocations. No osseous abnormality noted.  I personally ordered and interpreted today's x-rays.  Impression:   ICD-10-CM  1. Primary osteoarthritis of right knee M17.11   Plan:   The patient has end-stage degenerative changes of the right knee. It was explained to the patient that the condition is progressive in nature. Having failed conservative treatment, the patient has elected to proceed with a total joint arthroplasty. The patient will undergo a total joint arthroplasty with Dr. Marry Guan. The risks of surgery, including blood clot and infection, were discussed with the patient. Measures to reduce these risks, including the use of anticoagulation,  perioperative antibiotics, and early ambulation were discussed. The importance of postoperative physical therapy was discussed with the patient. The patient elects to proceed with surgery. The patient is instructed to stop all blood thinners prior to surgery. The patient is instructed to call the hospital the day before surgery to learn of the proper arrival time.   Contact our office with any questions or concerns. Follow up as indicated, or sooner should any new problems arise, if conditions worsen, or if they are otherwise concerned.   Gwenlyn Fudge, [facility] [place], [place] [address] Phone: [phone]  This note was generated in part with voice recognition software and I apologize for any typographical errors that were not detected and corrected.  Electronically signed by Gwenlyn Fudge, PA at 07/20/2021 5:41 PM EDT

## 2021-07-26 ENCOUNTER — Other Ambulatory Visit: Payer: Self-pay

## 2021-07-26 ENCOUNTER — Ambulatory Visit: Payer: Managed Care, Other (non HMO) | Admitting: Urgent Care

## 2021-07-26 ENCOUNTER — Observation Stay
Admission: RE | Admit: 2021-07-26 | Discharge: 2021-07-27 | Disposition: A | Discharge status: Home / Self Care | Payer: Managed Care, Other (non HMO) | Source: Ambulatory Visit | Attending: Orthopedic Surgery | Admitting: Orthopedic Surgery

## 2021-07-26 ENCOUNTER — Encounter
Admission: RE | Disposition: A | Discharge status: Home / Self Care | Payer: Self-pay | Source: Ambulatory Visit | Attending: Orthopedic Surgery

## 2021-07-26 ENCOUNTER — Observation Stay: Payer: Managed Care, Other (non HMO)

## 2021-07-26 DIAGNOSIS — Z7984 Long term (current) use of oral hypoglycemic drugs: Secondary | ICD-10-CM | POA: Insufficient documentation

## 2021-07-26 DIAGNOSIS — Z96659 Presence of unspecified artificial knee joint: Secondary | ICD-10-CM

## 2021-07-26 DIAGNOSIS — M1711 Unilateral primary osteoarthritis, right knee: Principal | ICD-10-CM | POA: Insufficient documentation

## 2021-07-26 DIAGNOSIS — Z23 Encounter for immunization: Secondary | ICD-10-CM | POA: Diagnosis not present

## 2021-07-26 DIAGNOSIS — I10 Essential (primary) hypertension: Secondary | ICD-10-CM | POA: Insufficient documentation

## 2021-07-26 DIAGNOSIS — Z96652 Presence of left artificial knee joint: Secondary | ICD-10-CM | POA: Diagnosis not present

## 2021-07-26 DIAGNOSIS — Z79899 Other long term (current) drug therapy: Secondary | ICD-10-CM | POA: Insufficient documentation

## 2021-07-26 HISTORY — PX: KNEE ARTHROPLASTY: SHX992

## 2021-07-26 LAB — GLUCOSE, CAPILLARY
Glucose-Capillary: 171 mg/dL — ABNORMAL HIGH (ref 70–99)
Glucose-Capillary: 190 mg/dL — ABNORMAL HIGH (ref 70–99)

## 2021-07-26 SURGERY — ARTHROPLASTY, KNEE, TOTAL, USING IMAGELESS COMPUTER-ASSISTED NAVIGATION
Anesthesia: Spinal | Site: Knee | Laterality: Right

## 2021-07-26 MED ORDER — ORAL CARE MOUTH RINSE: 15.0000 mL | Freq: Once | OROMUCOSAL | Status: DC

## 2021-07-26 MED ORDER — PROPOFOL 10 MG/ML IV BOLUS
INTRAVENOUS | Status: DC | PRN
  Administered 2021-07-26: 50 mg via INTRAVENOUS

## 2021-07-26 MED ORDER — PHENOL 1.4 % MT LIQD
1.0000 | OROMUCOSAL | Status: DC | PRN
  Filled 2021-07-26: qty 177

## 2021-07-26 MED ORDER — TRAMADOL HCL 50 MG PO TABS
50.0000 mg | ORAL_TABLET | ORAL | Status: DC | PRN
  Administered 2021-07-26 (×2): 100 mg via ORAL
  Administered 2021-07-26: 50 mg via ORAL
  Administered 2021-07-27 (×3): 100 mg via ORAL
  Filled 2021-07-26: qty 1
  Filled 2021-07-26 (×5): qty 2

## 2021-07-26 MED ORDER — OXYCODONE HCL 5 MG PO TABS: 5.0000 mg | ORAL_TABLET | ORAL | Status: DC | PRN

## 2021-07-26 MED ORDER — HYDROMORPHONE HCL 1 MG/ML IJ SOLN
0.5000 mg | INTRAMUSCULAR | Status: DC | PRN
  Administered 2021-07-26 (×2): 1 mg via INTRAVENOUS
  Filled 2021-07-26: qty 1

## 2021-07-26 MED ORDER — DEXAMETHASONE SODIUM PHOSPHATE 10 MG/ML IJ SOLN
INTRAMUSCULAR | Status: AC
  Administered 2021-07-26: 8 mg via INTRAVENOUS
  Filled 2021-07-26: qty 1

## 2021-07-26 MED ORDER — ACETAMINOPHEN 325 MG PO TABS: 325.0000 mg | ORAL_TABLET | Freq: Four times a day (QID) | ORAL | Status: DC | PRN

## 2021-07-26 MED ORDER — SCOPOLAMINE 1 MG/3DAYS TD PT72
MEDICATED_PATCH | TRANSDERMAL | Status: AC
  Filled 2021-07-26: qty 1

## 2021-07-26 MED ORDER — ACETAMINOPHEN 10 MG/ML IV SOLN: 1000.0000 mg | Freq: Once | INTRAVENOUS | Status: DC | PRN

## 2021-07-26 MED ORDER — CEFAZOLIN IN SODIUM CHLORIDE 3-0.9 GM/100ML-% IV SOLN
3.0000 g | INTRAVENOUS | Status: DC
  Filled 2021-07-26: qty 100

## 2021-07-26 MED ORDER — HYDROCHLOROTHIAZIDE 25 MG PO TABS
25.0000 mg | ORAL_TABLET | ORAL | Status: DC
  Filled 2021-07-26: qty 1

## 2021-07-26 MED ORDER — APREPITANT 40 MG PO CAPS
ORAL_CAPSULE | ORAL | Status: AC
  Administered 2021-07-26: 40 mg via ORAL
  Filled 2021-07-26: qty 1

## 2021-07-26 MED ORDER — FENTANYL CITRATE (PF) 100 MCG/2ML IJ SOLN
INTRAMUSCULAR | Status: AC
  Filled 2021-07-26: qty 2

## 2021-07-26 MED ORDER — MIDAZOLAM HCL 5 MG/5ML IJ SOLN
INTRAMUSCULAR | Status: DC | PRN
  Administered 2021-07-26: 2 mg via INTRAVENOUS

## 2021-07-26 MED ORDER — TRANEXAMIC ACID-NACL 1000-0.7 MG/100ML-% IV SOLN
INTRAVENOUS | Status: DC | PRN
  Administered 2021-07-26: 1000 mg via INTRAVENOUS

## 2021-07-26 MED ORDER — SENNOSIDES-DOCUSATE SODIUM 8.6-50 MG PO TABS
1.0000 | ORAL_TABLET | Freq: Two times a day (BID) | ORAL | Status: DC
  Administered 2021-07-26 – 2021-07-27 (×3): 1 via ORAL
  Filled 2021-07-26 (×2): qty 1

## 2021-07-26 MED ORDER — LACTATED RINGERS IV SOLN: INTRAVENOUS | Status: DC

## 2021-07-26 MED ORDER — PROPOFOL 1000 MG/100ML IV EMUL
INTRAVENOUS | Status: AC
  Filled 2021-07-26: qty 100

## 2021-07-26 MED ORDER — SODIUM CHLORIDE 0.9 % IR SOLN
Status: DC | PRN
  Administered 2021-07-26: 2700 mL

## 2021-07-26 MED ORDER — SODIUM CHLORIDE 0.9 % IV SOLN
INTRAVENOUS | Status: DC | PRN
  Administered 2021-07-26: 60 mL

## 2021-07-26 MED ORDER — CHLORHEXIDINE GLUCONATE 4 % EX LIQD: 60.0000 mL | Freq: Once | CUTANEOUS | Status: DC

## 2021-07-26 MED ORDER — CEFAZOLIN SODIUM-DEXTROSE 2-4 GM/100ML-% IV SOLN
2.0000 g | Freq: Four times a day (QID) | INTRAVENOUS | Status: AC
Start: 2021-07-26 — End: 2021-07-27
  Administered 2021-07-26 (×2): 2 g via INTRAVENOUS
  Filled 2021-07-26 (×2): qty 100

## 2021-07-26 MED ORDER — MIDAZOLAM HCL 2 MG/2ML IJ SOLN
INTRAMUSCULAR | Status: AC
  Filled 2021-07-26: qty 2

## 2021-07-26 MED ORDER — APREPITANT 40 MG PO CAPS: 40.0000 mg | ORAL_CAPSULE | Freq: Once | ORAL | Status: AC

## 2021-07-26 MED ORDER — METOCLOPRAMIDE HCL 10 MG PO TABS
10.0000 mg | ORAL_TABLET | Freq: Three times a day (TID) | ORAL | Status: DC
  Administered 2021-07-26 – 2021-07-27 (×4): 10 mg via ORAL
  Filled 2021-07-26 (×4): qty 1

## 2021-07-26 MED ORDER — ROCURONIUM BROMIDE 100 MG/10ML IV SOLN
INTRAVENOUS | Status: DC | PRN
  Administered 2021-07-26: 100 mg via INTRAVENOUS

## 2021-07-26 MED ORDER — INFLUENZA VAC SPLIT QUAD 0.5 ML IM SUSY
0.5000 mL | PREFILLED_SYRINGE | INTRAMUSCULAR | Status: AC
  Administered 2021-07-27: 0.5 mL via INTRAMUSCULAR
  Filled 2021-07-26: qty 0.5

## 2021-07-26 MED ORDER — ONDANSETRON HCL 4 MG/2ML IJ SOLN
INTRAMUSCULAR | Status: DC | PRN
  Administered 2021-07-26: 4 mg via INTRAVENOUS

## 2021-07-26 MED ORDER — PHENYLEPHRINE HCL (PRESSORS) 10 MG/ML IV SOLN
INTRAVENOUS | Status: AC
  Filled 2021-07-26: qty 2

## 2021-07-26 MED ORDER — FAMOTIDINE 20 MG PO TABS: 20.0000 mg | ORAL_TABLET | Freq: Once | ORAL | Status: AC

## 2021-07-26 MED ORDER — ALUM & MAG HYDROXIDE-SIMETH 200-200-20 MG/5ML PO SUSP: 30.0000 mL | ORAL | Status: DC | PRN

## 2021-07-26 MED ORDER — FAMOTIDINE 20 MG PO TABS
ORAL_TABLET | ORAL | Status: AC
  Administered 2021-07-26: 20 mg via ORAL
  Filled 2021-07-26: qty 1

## 2021-07-26 MED ORDER — ENOXAPARIN SODIUM 30 MG/0.3ML IJ SOSY
30.0000 mg | PREFILLED_SYRINGE | Freq: Two times a day (BID) | INTRAMUSCULAR | Status: DC
  Administered 2021-07-27: 30 mg via SUBCUTANEOUS
  Filled 2021-07-26: qty 0.3

## 2021-07-26 MED ORDER — CHLORHEXIDINE GLUCONATE 0.12 % MT SOLN
OROMUCOSAL | Status: AC
  Administered 2021-07-26: 15 mL via OROMUCOSAL
  Filled 2021-07-26: qty 15

## 2021-07-26 MED ORDER — ACETAMINOPHEN 10 MG/ML IV SOLN
INTRAVENOUS | Status: DC | PRN
  Administered 2021-07-26: 1000 mg via INTRAVENOUS

## 2021-07-26 MED ORDER — DEXAMETHASONE SODIUM PHOSPHATE 10 MG/ML IJ SOLN: 8.0000 mg | Freq: Once | INTRAMUSCULAR | Status: DC

## 2021-07-26 MED ORDER — DIPHENHYDRAMINE HCL 25 MG PO CAPS: 25.0000 mg | ORAL_CAPSULE | Freq: Every day | ORAL | Status: DC | PRN

## 2021-07-26 MED ORDER — PANTOPRAZOLE SODIUM 40 MG PO TBEC
40.0000 mg | DELAYED_RELEASE_TABLET | Freq: Two times a day (BID) | ORAL | Status: DC
  Administered 2021-07-26 – 2021-07-27 (×3): 40 mg via ORAL
  Filled 2021-07-26 (×2): qty 1

## 2021-07-26 MED ORDER — FENTANYL CITRATE (PF) 100 MCG/2ML IJ SOLN
25.0000 ug | INTRAMUSCULAR | Status: DC | PRN
  Administered 2021-07-26 (×2): 25 ug via INTRAVENOUS

## 2021-07-26 MED ORDER — PSYLLIUM 95 % PO PACK
1.0000 | PACK | Freq: Every morning | ORAL | Status: DC
  Administered 2021-07-27: 1 via ORAL
  Filled 2021-07-26: qty 1

## 2021-07-26 MED ORDER — CHLORHEXIDINE GLUCONATE 0.12 % MT SOLN: 15.0000 mL | Freq: Once | OROMUCOSAL | Status: DC

## 2021-07-26 MED ORDER — ONDANSETRON HCL 4 MG/2ML IJ SOLN: 4.0000 mg | Freq: Four times a day (QID) | INTRAMUSCULAR | Status: DC | PRN

## 2021-07-26 MED ORDER — TRANEXAMIC ACID-NACL 1000-0.7 MG/100ML-% IV SOLN: 1000.0000 mg | INTRAVENOUS | Status: DC

## 2021-07-26 MED ORDER — TRANEXAMIC ACID-NACL 1000-0.7 MG/100ML-% IV SOLN
1000.0000 mg | Freq: Once | INTRAVENOUS | Status: AC
  Administered 2021-07-26: 1000 mg via INTRAVENOUS

## 2021-07-26 MED ORDER — FLEET ENEMA 7-19 GM/118ML RE ENEM: 1.0000 | ENEMA | Freq: Once | RECTAL | Status: DC | PRN

## 2021-07-26 MED ORDER — BISACODYL 10 MG RE SUPP: 10.0000 mg | Freq: Every day | RECTAL | Status: DC | PRN

## 2021-07-26 MED ORDER — CELECOXIB 200 MG PO CAPS: 400.0000 mg | ORAL_CAPSULE | Freq: Once | ORAL | Status: AC

## 2021-07-26 MED ORDER — PHENTERMINE HCL 37.5 MG PO CAPS: 37.5000 mg | ORAL_CAPSULE | ORAL | Status: DC

## 2021-07-26 MED ORDER — ENSURE PRE-SURGERY PO LIQD
296.0000 mL | Freq: Once | ORAL | Status: DC
  Filled 2021-07-26: qty 296

## 2021-07-26 MED ORDER — GABAPENTIN 300 MG PO CAPS: 300.0000 mg | ORAL_CAPSULE | Freq: Once | ORAL | Status: DC

## 2021-07-26 MED ORDER — ACETAMINOPHEN 10 MG/ML IV SOLN
1000.0000 mg | Freq: Four times a day (QID) | INTRAVENOUS | Status: DC
  Administered 2021-07-26 – 2021-07-27 (×3): 1000 mg via INTRAVENOUS
  Filled 2021-07-26 (×5): qty 100

## 2021-07-26 MED ORDER — ONDANSETRON HCL 4 MG PO TABS: 4.0000 mg | ORAL_TABLET | Freq: Four times a day (QID) | ORAL | Status: DC | PRN

## 2021-07-26 MED ORDER — SCOPOLAMINE 1 MG/3DAYS TD PT72
1.0000 | MEDICATED_PATCH | TRANSDERMAL | Status: DC
  Administered 2021-07-26: 1.5 mg via TRANSDERMAL

## 2021-07-26 MED ORDER — TRANEXAMIC ACID-NACL 1000-0.7 MG/100ML-% IV SOLN
INTRAVENOUS | Status: AC
  Filled 2021-07-26: qty 100

## 2021-07-26 MED ORDER — BUPIVACAINE HCL (PF) 0.25 % IJ SOLN
INTRAMUSCULAR | Status: DC | PRN
  Administered 2021-07-26: 60 mL

## 2021-07-26 MED ORDER — GABAPENTIN 300 MG PO CAPS
ORAL_CAPSULE | ORAL | Status: AC
  Administered 2021-07-26: 300 mg via ORAL
  Filled 2021-07-26: qty 1

## 2021-07-26 MED ORDER — SURGIRINSE WOUND IRRIGATION SYSTEM - OPTIME
TOPICAL | Status: DC | PRN
  Administered 2021-07-26: 450 mL

## 2021-07-26 MED ORDER — 0.9 % SODIUM CHLORIDE (POUR BTL) OPTIME
TOPICAL | Status: DC | PRN
  Administered 2021-07-26: 500 mL

## 2021-07-26 MED ORDER — ONDANSETRON HCL 4 MG/2ML IJ SOLN: 4.0000 mg | Freq: Once | INTRAMUSCULAR | Status: DC | PRN

## 2021-07-26 MED ORDER — DEXTROSE 5 % IV SOLN
INTRAVENOUS | Status: DC | PRN
  Administered 2021-07-26: 3 g via INTRAVENOUS

## 2021-07-26 MED ORDER — OXYCODONE HCL 5 MG PO TABS: 10.0000 mg | ORAL_TABLET | ORAL | Status: DC | PRN

## 2021-07-26 MED ORDER — MAGNESIUM HYDROXIDE 400 MG/5ML PO SUSP
30.0000 mL | Freq: Every day | ORAL | Status: DC
  Administered 2021-07-26: 30 mL via ORAL
  Filled 2021-07-26: qty 30

## 2021-07-26 MED ORDER — MENTHOL 3 MG MT LOZG
1.0000 | LOZENGE | OROMUCOSAL | Status: DC | PRN
  Filled 2021-07-26: qty 9

## 2021-07-26 MED ORDER — CELECOXIB 200 MG PO CAPS
ORAL_CAPSULE | ORAL | Status: AC
  Administered 2021-07-26: 400 mg via ORAL
  Filled 2021-07-26: qty 2

## 2021-07-26 MED ORDER — PROPOFOL 500 MG/50ML IV EMUL
INTRAVENOUS | Status: DC | PRN
  Administered 2021-07-26: 150 ug/kg/min via INTRAVENOUS
  Administered 2021-07-26: 80 ug/kg/min via INTRAVENOUS

## 2021-07-26 MED ORDER — FERROUS SULFATE 325 (65 FE) MG PO TABS
325.0000 mg | ORAL_TABLET | Freq: Two times a day (BID) | ORAL | Status: DC
  Administered 2021-07-26 – 2021-07-27 (×2): 325 mg via ORAL
  Filled 2021-07-26 (×2): qty 1

## 2021-07-26 MED ORDER — SODIUM CHLORIDE 0.9 % IV SOLN: INTRAVENOUS | Status: DC

## 2021-07-26 MED ORDER — SODIUM CHLORIDE 0.9 % IV SOLN
INTRAVENOUS | Status: DC | PRN
  Administered 2021-07-26: 30 ug/min via INTRAVENOUS

## 2021-07-26 MED ORDER — CELECOXIB 200 MG PO CAPS
200.0000 mg | ORAL_CAPSULE | Freq: Two times a day (BID) | ORAL | Status: DC
  Administered 2021-07-26 – 2021-07-27 (×2): 200 mg via ORAL
  Filled 2021-07-26 (×2): qty 1

## 2021-07-26 MED ORDER — SUGAMMADEX SODIUM 500 MG/5ML IV SOLN
INTRAVENOUS | Status: DC | PRN
  Administered 2021-07-26: 482.4 mg via INTRAVENOUS

## 2021-07-26 MED ORDER — INSULIN ASPART 100 UNIT/ML IJ SOLN
0.0000 [IU] | Freq: Three times a day (TID) | INTRAMUSCULAR | Status: DC
  Administered 2021-07-26: 3 [IU] via SUBCUTANEOUS
  Filled 2021-07-26: qty 1

## 2021-07-26 MED ORDER — BUPIVACAINE HCL (PF) 0.5 % IJ SOLN
INTRAMUSCULAR | Status: DC | PRN
  Administered 2021-07-26: 3 mL

## 2021-07-26 MED ORDER — INSULIN ASPART 100 UNIT/ML IJ SOLN: 0.0000 [IU] | Freq: Every day | INTRAMUSCULAR | Status: DC

## 2021-07-26 SURGICAL SUPPLY — 80 items
ATTUNE PS FEM RT SZ 4 CEM KNEE (Femur) ×1 IMPLANT
ATTUNE PSRP INSR SZ4 6 KNEE (Insert) ×1 IMPLANT
BASEPLATE TIBIAL ROTATING SZ 4 (Knees) ×1 IMPLANT
BATTERY INSTRU NAVIGATION (MISCELLANEOUS) ×8 IMPLANT
BLADE SAW 70X12.5 (BLADE) ×2 IMPLANT
BLADE SAW 90X13X1.19 OSCILLAT (BLADE) ×2 IMPLANT
BLADE SAW 90X25X1.19 OSCILLAT (BLADE) ×2 IMPLANT
BONE CEMENT GENTAMICIN (Cement) ×4 IMPLANT
BSPLAT TIB 4 CMNT ROT PLAT STR (Knees) ×1 IMPLANT
BTRY SRG DRVR LF (MISCELLANEOUS) ×4
CEMENT BONE GENTAMICIN 40 (Cement) IMPLANT
COOLER POLAR GLACIER W/PUMP (MISCELLANEOUS) ×2 IMPLANT
CUFF TOURN SGL QUICK 24 (TOURNIQUET CUFF)
CUFF TOURN SGL QUICK 34 (TOURNIQUET CUFF)
CUFF TRNQT CYL 24X4X16.5-23 (TOURNIQUET CUFF) IMPLANT
CUFF TRNQT CYL 34X4.125X (TOURNIQUET CUFF) IMPLANT
DRAPE 3/4 80X56 (DRAPES) ×2 IMPLANT
DRAPE INCISE IOBAN 66X45 STRL (DRAPES) ×2 IMPLANT
DRSG DERMACEA 8X12 NADH (GAUZE/BANDAGES/DRESSINGS) ×2 IMPLANT
DRSG MEPILEX SACRM 8.7X9.8 (GAUZE/BANDAGES/DRESSINGS) ×2 IMPLANT
DRSG OPSITE POSTOP 4X14 (GAUZE/BANDAGES/DRESSINGS) ×2 IMPLANT
DRSG TEGADERM 4X4.75 (GAUZE/BANDAGES/DRESSINGS) ×2 IMPLANT
DURAPREP 26ML APPLICATOR (WOUND CARE) ×4 IMPLANT
ELECT CAUTERY BLADE 6.4 (BLADE) ×2 IMPLANT
ELECT REM PT RETURN 9FT ADLT (ELECTROSURGICAL) ×2
ELECTRODE REM PT RTRN 9FT ADLT (ELECTROSURGICAL) ×1 IMPLANT
EX-PIN ORTHOLOCK NAV 4X150 (PIN) ×4 IMPLANT
GLOVE SRG 8 PF TXTR STRL LF DI (GLOVE) ×1 IMPLANT
GLOVE SURG ENC TEXT LTX SZ7.5 (GLOVE) ×4 IMPLANT
GLOVE SURG UNDER POLY LF SZ7.5 (GLOVE) ×8 IMPLANT
GLOVE SURG UNDER POLY LF SZ8 (GLOVE) ×2
GOWN STRL REUS W/ TWL LRG LVL3 (GOWN DISPOSABLE) ×2 IMPLANT
GOWN STRL REUS W/ TWL XL LVL3 (GOWN DISPOSABLE) ×1 IMPLANT
GOWN STRL REUS W/TWL LRG LVL3 (GOWN DISPOSABLE) ×4
GOWN STRL REUS W/TWL XL LVL3 (GOWN DISPOSABLE) ×2
HEMOVAC 400CC 10FR (MISCELLANEOUS) ×2 IMPLANT
HOLDER FOLEY CATH W/STRAP (MISCELLANEOUS) ×2 IMPLANT
IRRIGATION SURGIPHOR STRL (IV SOLUTION) ×2 IMPLANT
IV NS IRRIG 3000ML ARTHROMATIC (IV SOLUTION) ×2 IMPLANT
KIT TURNOVER KIT A (KITS) ×2 IMPLANT
KNIFE SCULPS 14X20 (INSTRUMENTS) ×2 IMPLANT
LABEL OR SOLS (LABEL) ×2 IMPLANT
MANIFOLD NEPTUNE II (INSTRUMENTS) ×4 IMPLANT
NDL SAFETY ECLIPSE 18X1.5 (NEEDLE) ×1 IMPLANT
NDL SPNL 20GX3.5 QUINCKE YW (NEEDLE) ×2 IMPLANT
NEEDLE HYPO 18GX1.5 SHARP (NEEDLE) ×2
NEEDLE SPNL 20GX3.5 QUINCKE YW (NEEDLE) ×4 IMPLANT
NS IRRIG 500ML POUR BTL (IV SOLUTION) ×2 IMPLANT
PACK TOTAL KNEE (MISCELLANEOUS) ×2 IMPLANT
PAD ABD DERMACEA PRESS 5X9 (GAUZE/BANDAGES/DRESSINGS) ×4 IMPLANT
PAD WRAPON POLAR KNEE (MISCELLANEOUS) ×1 IMPLANT
PAD WRAPON POLOR MULTI XL (MISCELLANEOUS) IMPLANT
PATELLA MEDIAL ATTUN 35MM KNEE (Knees) ×1 IMPLANT
PENCIL SMOKE EVACUATOR COATED (MISCELLANEOUS) ×2 IMPLANT
PIN DRILL FIX HALF THREAD (BIT) ×4 IMPLANT
PIN DRILL QUICK PACK ×3 IMPLANT
PIN FIXATION 1/8DIA X 3INL (PIN) ×2 IMPLANT
PULSAVAC PLUS IRRIG FAN TIP (DISPOSABLE) ×2
SOL PREP PVP 2OZ (MISCELLANEOUS) ×2
SOLUTION PREP PVP 2OZ (MISCELLANEOUS) ×1 IMPLANT
SPONGE DRAIN TRACH 4X4 STRL 2S (GAUZE/BANDAGES/DRESSINGS) ×2 IMPLANT
SPONGE T-LAP 18X18 ~~LOC~~+RFID (SPONGE) ×6 IMPLANT
STAPLER SKIN PROX 35W (STAPLE) ×2 IMPLANT
STOCKINETTE IMPERV 14X48 (MISCELLANEOUS) IMPLANT
STRAP TIBIA SHORT (MISCELLANEOUS) ×2 IMPLANT
SUCTION FRAZIER HANDLE 10FR (MISCELLANEOUS) ×2
SUCTION TUBE FRAZIER 10FR DISP (MISCELLANEOUS) ×1 IMPLANT
SUT VIC AB 0 CT1 36 (SUTURE) ×4 IMPLANT
SUT VIC AB 1 CT1 36 (SUTURE) ×4 IMPLANT
SUT VIC AB 2-0 CT2 27 (SUTURE) ×2 IMPLANT
SYR 20ML LL LF (SYRINGE) ×2 IMPLANT
SYR 30ML LL (SYRINGE) ×4 IMPLANT
TIP FAN IRRIG PULSAVAC PLUS (DISPOSABLE) ×1 IMPLANT
TOWEL OR 17X26 4PK STRL BLUE (TOWEL DISPOSABLE) ×2 IMPLANT
TOWER CARTRIDGE SMART MIX (DISPOSABLE) ×2 IMPLANT
TRAY FOLEY MTR SLVR 16FR STAT (SET/KITS/TRAYS/PACK) ×2 IMPLANT
WATER STERILE IRR 500ML POUR (IV SOLUTION) ×2 IMPLANT
WRAP-ON POLOR PAD MULTI XL (MISCELLANEOUS) ×1
WRAPON POLAR PAD KNEE (MISCELLANEOUS)
WRAPON POLOR PAD MULTI XL (MISCELLANEOUS) ×2

## 2021-07-26 NOTE — Evaluation (Addendum)
Physical Therapy Evaluation Patient Details Name: Karen Morrison MRN: [id number] DOB: 1970-12-02 Today's Date: 07/26/2021  History of Present Illness  Pt is a 49 y.o. F s/p R TKA 07/26/21 PMH includes HTN, L TKA (2014),  Clinical Impression  Pt alert, oriented x 4, overall very motivated for OOB activity. Pt states PLOF as independence with all tasks and will have 24/hr assist at d/c. Pt required MIN G for mobility with cues for hand placement on RW and managing RLE with bed mobility. Pt states pain 5/10 with activity which decreases with rest. Overall, pt making good progress with mobility and PT to continue gait and stair training in future treatment sessions. Outpatient PT is recommended at discharge. Skilled PT intervention is indicated to address deficits in function, mobility, and to return to PLOF as able.       Recommendations for follow up therapy are one component of a multi-disciplinary discharge planning process, led by the attending physician.  Recommendations may be updated based on patient status, additional functional criteria and insurance authorization.  Follow Up Recommendations Outpatient PT    Equipment Recommendations  Rolling walker with 5" wheels    Recommendations for Other Services       Precautions / Restrictions Precautions Precautions: Fall;Knee Precaution Booklet Issued: Yes (comment) Restrictions Weight Bearing Restrictions: Yes RLE Weight Bearing: Weight bearing as tolerated      Mobility  Bed Mobility Overal bed mobility: Needs Assistance Bed Mobility: Supine to Sit     Supine to sit: HOB elevated;Min guard     General bed mobility comments: Cues for crossing RLE over LLE during transfer, no physical assist necessary    Transfers Overall transfer level: Needs assistance Equipment used: Rolling walker (2 wheeled) Transfers: Sit to/from Stand Sit to Stand: Min guard         General transfer comment: MIN G for safety, able to stand from  average bed height  Ambulation/Gait Ambulation/Gait assistance: Min guard Gait Distance (Feet): 3 Feet Assistive device: Rolling walker (2 wheeled) Gait Pattern/deviations: Step-to pattern;Decreased weight shift to right     General Gait Details: Decreased WB RLE, able to side step bed > recliner without LOB  Stairs            Wheelchair Mobility    Modified Rankin (Stroke Patients Only)       Balance Overall balance assessment: Needs assistance Sitting-balance support: Feet supported Sitting balance-Leahy Scale: Good Sitting balance - Comments: reaches outside BOS without LOB   Standing balance support: Bilateral upper extremity supported Standing balance-Leahy Scale: Poor Standing balance comment: requires BUE support for static & dynamic tasks                             Pertinent Vitals/Pain Pain Assessment: 0-10 Pain Score: 5  Pain Location: R knee Pain Descriptors / Indicators: Aching;Sore Pain Intervention(s): Limited activity within patient's tolerance;Monitored during session;Premedicated before session;Repositioned;Ice applied    Home Living Family/patient expects to be discharged to:: Private residence Living Arrangements: Spouse/significant other Available Help at Discharge: Family;Available 24 hours/day Type of Home: House Home Access: Stairs to enter Entrance Stairs-Rails: None Entrance Stairs-Number of Steps: 2 Home Layout: One level Home Equipment: Walker - 4 wheels;Walker - standard;Cane - quad;Toilet riser;Shower seat - built in      Prior Function Level of Independence: Independent         Comments: Indep for all mobility & ADLs. Pt drives and currently works  Hand Dominance   Dominant Hand: Right    Extremity/Trunk Assessment   Upper Extremity Assessment Upper Extremity Assessment: Overall WFL for tasks assessed    Lower Extremity Assessment Lower Extremity Assessment: RLE deficits/detail RLE: Unable to  fully assess due to immobilization RLE Sensation: decreased light touch (Slight decrease sensation plantar aspect great toe)    Cervical / Trunk Assessment Cervical / Trunk Assessment: Normal  Communication   Communication: No difficulties  Cognition Arousal/Alertness: Awake/alert Behavior During Therapy: WFL for tasks assessed/performed Overall Cognitive Status: Within Functional Limits for tasks assessed                                 General Comments: AOx4      General Comments      Exercises Total Joint Exercises Ankle Circles/Pumps: AROM;20 reps;Both;Supine Quad Sets: Supine;AROM;Both;10 reps Short Arc Quad: AROM;Right;10 reps;Supine Hip ABduction/ADduction: AROM;Right;10 reps;Supine Goniometric ROM: 8-59 Other Exercises Other Exercises: Pt edu: minimizing KF in bed,bone foam at night, and HEP   Assessment/Plan    PT Assessment Patient needs continued PT services  PT Problem List Decreased strength;Decreased range of motion;Decreased activity tolerance;Decreased balance;Decreased mobility       PT Treatment Interventions Balance training;Gait training;Neuromuscular re-education;Stair training;Functional mobility training;Therapeutic activities;Therapeutic exercise    PT Goals (Current goals can be found in the Care Plan section)  Acute Rehab PT Goals Patient Stated Goal: To get stronger PT Goal Formulation: With patient Time For Goal Achievement: 08/09/21 Potential to Achieve Goals: Good    Frequency BID   Barriers to discharge        Co-evaluation               AM-PAC PT "6 Clicks" Mobility  Outcome Measure Help needed turning from your back to your side while in a flat bed without using bedrails?: A Little Help needed moving from lying on your back to sitting on the side of a flat bed without using bedrails?: A Little Help needed moving to and from a bed to a chair (including a wheelchair)?: A Little Help needed standing up from a  chair using your arms (e.g., wheelchair or bedside chair)?: A Little Help needed to walk in hospital room?: A Little Help needed climbing 3-5 steps with a railing? : A Lot 6 Click Score: 17    End of Session Equipment Utilized During Treatment: Gait belt Activity Tolerance: Patient tolerated treatment well Patient left: with call bell/phone within reach;in chair;with chair alarm set;with SCD's reapplied Nurse Communication: Mobility status PT Visit Diagnosis: Muscle weakness (generalized) (M62.81);Other abnormalities of gait and mobility (R26.89)    Time: 1529-1610 PT Time Calculation (min) (ACUTE ONLY): 41 min   Charges:            [employer], SPT

## 2021-07-26 NOTE — Anesthesia Procedure Notes (Signed)
Spinal  Patient location during procedure: OR Start time: 07/26/2021 7:20 AM End time: 07/26/2021 7:23 AM Reason for block: surgical anesthesia Staffing Performed: resident/CRNA  Resident/CRNA: Nelda Marseille, CRNA Preanesthetic Checklist Completed: patient identified, IV checked, site marked, risks and benefits discussed, surgical consent, monitors and equipment checked, pre-op evaluation and timeout performed Spinal Block Patient position: sitting Prep: Betadine Patient monitoring: heart rate, continuous pulse ox, blood pressure and cardiac monitor Approach: midline Location: L3-4 Injection technique: single-shot Needle Needle type: Whitacre and Introducer  Needle gauge: 25 G Needle length: 9 cm Assessment Sensory level: T10 Events: CSF return Additional Notes Negative paresthesia. Negative blood return. Positive free-flowing CSF. Expiration date of kit checked and confirmed. Patient tolerated procedure well, without complications.

## 2021-07-26 NOTE — Anesthesia Procedure Notes (Signed)
Procedure Name: Intubation Date/Time: 07/26/2021 9:07 AM Performed by: Arita Miss, MD Pre-anesthesia Checklist: Patient identified, Patient being monitored, Timeout performed, Emergency Drugs available and Suction available Patient Re-evaluated:Patient Re-evaluated prior to induction Oxygen Delivery Method: Circle system utilized Preoxygenation: Pre-oxygenation with 100% oxygen Induction Type: IV induction and Rapid sequence Laryngoscope Size: 3 and McGraph Grade View: Grade I Tube type: Oral Tube size: 7.0 mm Number of attempts: 1 Airway Equipment and Method: Stylet Placement Confirmation: ETT inserted through vocal cords under direct vision, positive ETCO2 and breath sounds checked- equal and bilateral Secured at: 21 cm Tube secured with: Tape Dental Injury: Teeth and Oropharynx as per pre-operative assessment  Comments: RSI with double dose rocuronium for airway protection. MV not attempted. Bilious fluid noted around cords, and in ETT upon intubation. ETT suctioned out prior to delivering breaths through it.

## 2021-07-26 NOTE — H&P (Signed)
The patient has been re-examined, and the chart reviewed, and there have been no interval changes to the documented history and physical.    The risks, benefits, and alternatives have been discussed at length. The patient expressed understanding of the risks benefits and agreed with plans for surgical intervention.  Trianna Lupien P. Aylin Rhoads, Jr. M.D.    

## 2021-07-26 NOTE — TOC Progression Note (Signed)
Transition of Care [facility]) - Progression Note    Patient Details  Name: Karen Morrison MRN: [id number] Date of Birth: 22-Jul-1971  [facility]) CM/SW Red Willow, RN Phone Number: 07/26/2021, 4:15 PM  Clinical Narrative:   Attempted to see patient, care being given x 2  will see patient tomorrow  TOC to follow         Expected Discharge Plan and Services                                                 Social Determinants of Health (SDOH) Interventions    Readmission Risk Interventions No flowsheet data found.

## 2021-07-26 NOTE — Transfer of Care (Signed)
Immediate Anesthesia Transfer of Care Note  Patient: Karen Morrison  Procedure(s) Performed: COMPUTER ASSISTED TOTAL KNEE ARTHROPLASTY (Right: Knee)  Patient Location: PACU  Anesthesia Type:spinal  Level of Consciousness: sedated  Airway & Oxygen Therapy: Patient Spontanous Breathing and Patient connected to face mask oxygen  Post-op Assessment: Report given to RN and Post -op Vital signs reviewed and stable  Post vital signs: Reviewed and stable  Last Vitals:  Vitals Value Taken Time  BP    Temp    Pulse    Resp    SpO2      Last Pain:  Vitals:   07/26/21 0620  TempSrc: Temporal  PainSc: 0-No pain         Complications: No notable events documented.

## 2021-07-26 NOTE — Op Note (Signed)
OPERATIVE NOTE  DATE OF SURGERY:  07/26/2021  PATIENT NAME:  Karen Morrison   DOB: [birth year]  MRN: [id number]  PRE-OPERATIVE DIAGNOSIS: Degenerative arthrosis of the right knee, primary  POST-OPERATIVE DIAGNOSIS:  Same  PROCEDURE:  Right total knee arthroplasty using computer-assisted navigation  SURGEON:  Marciano Sequin. M.D.  ASSISTANT: Cassell Smiles, PA-C (present and scrubbed throughout the case, critical for assistance with exposure, retraction, instrumentation, and closure)  ANESTHESIA: spinal  ESTIMATED BLOOD LOSS: 50 mL  FLUIDS REPLACED: 1300 mL of crystalloid  TOURNIQUET TIME: 90 minutes  DRAINS: 2 medium Hemovac drains  SOFT TISSUE RELEASES: Anterior cruciate ligament, posterior cruciate ligament, deep medial collateral ligament, patellofemoral ligament  IMPLANTS UTILIZED: DePuy Attune size 4 posterior stabilized femoral component (cemented), size 4 rotating platform tibial component (cemented), 35 mm medialized dome patella (cemented), and a 6 mm stabilized rotating platform polyethylene insert.  INDICATIONS FOR SURGERY: Karen Morrison is a 50 y.o. year old female with a long history of progressive knee pain. X-rays demonstrated severe degenerative changes in tricompartmental fashion. The patient had not seen any significant improvement despite conservative nonsurgical intervention. After discussion of the risks and benefits of surgical intervention, the patient expressed understanding of the risks benefits and agree with plans for total knee arthroplasty.   The risks, benefits, and alternatives were discussed at length including but not limited to the risks of infection, bleeding, nerve injury, stiffness, blood clots, the need for revision surgery, cardiopulmonary complications, among others, and they were willing to proceed.  PROCEDURE IN DETAIL: The patient was brought into the operating room and, after adequate spinal and general anesthesia was achieved, a tourniquet  was placed on the patient's upper thigh. The patient's knee and leg were cleaned and prepped with alcohol and DuraPrep and draped in the usual sterile fashion. A "timeout" was performed as per usual protocol. The lower extremity was exsanguinated using an Esmarch, and the tourniquet was inflated to 300 mmHg. An anterior longitudinal incision was made followed by a standard mid vastus approach. The deep fibers of the medial collateral ligament were elevated in a subperiosteal fashion off of the medial flare of the tibia so as to maintain a continuous soft tissue sleeve. The patella was subluxed laterally and the patellofemoral ligament was incised. Inspection of the knee demonstrated severe degenerative changes with full-thickness loss of articular cartilage. Osteophytes were debrided using a rongeur. Anterior and posterior cruciate ligaments were excised. Two 4.0 mm Schanz pins were inserted in the femur and into the tibia for attachment of the array of trackers used for computer-assisted navigation. Hip center was identified using a circumduction technique. Distal landmarks were mapped using the computer. The distal femur and proximal tibia were mapped using the computer. The distal femoral cutting guide was positioned using computer-assisted navigation so as to achieve a 5 distal valgus cut. The femur was sized and it was felt that a size 4 femoral component was appropriate. A size 4 femoral cutting guide was positioned and the anterior cut was performed and verified using the computer. This was followed by completion of the posterior and chamfer cuts. Femoral cutting guide for the central box was then positioned in the center box cut was performed.  Attention was then directed to the proximal tibia. Medial and lateral menisci were excised. The extramedullary tibial cutting guide was positioned using computer-assisted navigation so as to achieve a 0 varus-valgus alignment and 3 posterior slope. The cut was  performed and verified using the computer. The proximal  tibia was sized and it was felt that a size 4 tibial tray was appropriate. Tibial and femoral trials were inserted followed by insertion of a 6 mm polyethylene insert. This allowed for excellent mediolateral soft tissue balancing both in flexion and in full extension. Finally, the patella was cut and prepared so as to accommodate a 35 mm medialized dome patella. A patella trial was placed and the knee was placed through a range of motion with excellent patellar tracking appreciated. The femoral trial was removed after debridement of posterior osteophytes. The central post-hole for the tibial component was reamed followed by insertion of a keel punch. Tibial trials were then removed. Cut surfaces of bone were irrigated with copious amounts of normal saline using pulsatile lavage and then suctioned dry. Polymethylmethacrylate cement with gentamicin was prepared in the usual fashion using a vacuum mixer. Cement was applied to the cut surface of the proximal tibia as well as along the undersurface of a size 4 rotating platform tibial component. Tibial component was positioned and impacted into place. Excess cement was removed using [employer] fast streamer. Cement was then applied to the cut surfaces of the femur as well as along the posterior flanges of the size 4 femoral component. The femoral component was positioned and impacted into place. Excess cement was removed using [employer] fast streamer. A 6 mm polyethylene trial was inserted and the knee was brought into full extension with steady axial compression applied. Finally, cement was applied to the backside of a 35 mm medialized dome patella and the patellar component was positioned and patellar clamp applied. Excess cement was removed using [employer] fast streamer. After adequate curing of the cement, the tourniquet was deflated after a total tourniquet time of 90 minutes. Hemostasis was achieved using electrocautery. The knee was  irrigated with copious amounts of normal saline using pulsatile lavage followed by 500 ml of Surgiphor and then suctioned dry. 20 mL of 1.3% Exparel and 60 mL of 0.25% Marcaine in 40 mL of normal saline was injected along the posterior capsule, medial and lateral gutters, and along the arthrotomy site. A 6 mm stabilized rotating platform polyethylene insert was inserted and the knee was placed through a range of motion with excellent mediolateral soft tissue balancing appreciated and excellent patellar tracking noted. 2 medium drains were placed in the wound bed and brought out through separate stab incisions. The medial parapatellar portion of the incision was reapproximated using interrupted sutures of #1 Vicryl. Subcutaneous tissue was approximated in layers using first #0 Vicryl followed #2-0 Vicryl. The skin was approximated with skin staples. A sterile dressing was applied.  The patient tolerated the procedure well and was transported to the recovery room in stable condition.    Mikita Lesmeister P. Holley Bouche., M.D.

## 2021-07-26 NOTE — Evaluation (Signed)
Occupational Therapy Evaluation Patient Details Name: Karen Morrison MRN: [id number] DOB: 03/27/1971 Today's Date: 07/26/2021   History of Present Illness Pt is a 50 y.o. F s/p R TKA 07/26/21 PMH includes HTN, L TKA (2014).   Clinical Impression   Pt seen for OT evaluation this date, POD#0 from above surgery, family member present. Pt was independent in all ADL and IADL including working prior to surgery and is eager to return to PLOF with less pain and improved safety and independence. Pt currently requires minimal assist for LB dressing and bathing while in seated position due to pain and limited AROM of R knee. Pt/family instructed in polar care mgt, falls prevention strategies, home/routines modifications, DME/AE, and compression stocking mgt. Pt/family verbalized understanding and denied additional questions/concerns. Handout provided to support recall and carryover. Do not currently anticipate any additional skilled acute OT needs at this time. Will sign off.    Recommendations for follow up therapy are one component of a multi-disciplinary discharge planning process, led by the attending physician.  Recommendations may be updated based on patient status, additional functional criteria and insurance authorization.   Follow Up Recommendations  No OT follow up    Equipment Recommendations  3 in 1 bedside commode    Recommendations for Other Services       Precautions / Restrictions Precautions Precautions: Fall;Knee Precaution Booklet Issued: Yes (comment) Restrictions Weight Bearing Restrictions: Yes RLE Weight Bearing: Weight bearing as tolerated      Mobility Bed Mobility Overal bed mobility: Needs Assistance Bed Mobility: Sit to Supine      Sit to supine: Supervision;Min guard;HOB elevated       Transfers Overall transfer level: Needs assistance Equipment used: Rolling walker (2 wheeled) Transfers: Sit to/from Stand Sit to Stand: Min guard         General  transfer comment: returning to bed with NT after toileting using BSC, CGA from NT    Balance Overall balance assessment: Needs assistance Sitting-balance support: Feet supported Sitting balance-Leahy Scale: Good Sitting balance - Comments: reaches outside BOS without LOB   Standing balance support: Bilateral upper extremity supported Standing balance-Leahy Scale: Poor Standing balance comment: requires BUE support                           ADL either performed or assessed with clinical judgement   ADL Overall ADL's : Needs assistance/impaired                                       General ADL Comments: Pt currently requires MIN A for LB ADL and MOD-MAX A for compression stocking mgt and polar care mgt. CGA for ADL transfers + RW. Family in room verbalizes ability to provide needed level of assist.     Vision         Perception     Praxis      Pertinent Vitals/Pain Pain Assessment: 0-10 Pain Score: 6  Pain Location: R knee Pain Descriptors / Indicators: Aching;Sore Pain Intervention(s): Limited activity within patient's tolerance;Monitored during session;Premedicated before session;Repositioned;Ice applied     Hand Dominance Right   Extremity/Trunk Assessment Upper Extremity Assessment Upper Extremity Assessment: Overall WFL for tasks assessed   Lower Extremity Assessment Lower Extremity Assessment: RLE deficits/detail RLE Deficits / Details: s/p TKA RLE: Unable to fully assess due to immobilization RLE Sensation: decreased light touch (Slight decrease  sensation plantar aspect great toe)   Cervical / Trunk Assessment Cervical / Trunk Assessment: Normal   Communication Communication Communication: No difficulties   Cognition Arousal/Alertness: Awake/alert Behavior During Therapy: WFL for tasks assessed/performed Overall Cognitive Status: Within Functional Limits for tasks assessed                                  General Comments: AOx4   General Comments       Exercises Other Exercises: Pt/family instructed in falls prevention, home/routines modifications for ADL, AE/DME for ADL, compression stocking mgt, and polar care mgt. Pt/family verbalized understanding and denied additional questions/concerns. Handout provided to support recall and carryover.   Shoulder Instructions      Home Living Family/patient expects to be discharged to:: Private residence Living Arrangements: Spouse/significant other Available Help at Discharge: Family;Available 24 hours/day Type of Home: House Home Access: Stairs to enter [employer] of Steps: 2 Entrance Stairs-Rails: None Home Layout: One level     Bathroom Shower/Tub: Walk-in shower         Home Equipment: Environmental consultant - 4 wheels;Walker - standard;Cane - quad;Toilet riser;Shower seat - built in          Prior Functioning/Environment Level of Independence: Independent        Comments: Indep for all mobility & ADLs. Pt drives and currently works        OT Problem List: Decreased strength;Pain;Decreased range of motion;Impaired balance (sitting and/or standing)      OT Treatment/Interventions:      OT Goals(Current goals can be found in the care plan section) Acute Rehab OT Goals Patient Stated Goal: To get stronger OT Goal Formulation: All assessment and education complete, DC therapy  OT Frequency:     Barriers to D/C:            Co-evaluation              AM-PAC OT "6 Clicks" Daily Activity     Outcome Measure Help from another person eating meals?: None Help from another person taking care of personal grooming?: None Help from another person toileting, which includes using toliet, bedpan, or urinal?: A Little Help from another person bathing (including washing, rinsing, drying)?: A Little Help from another person to put on and taking off regular upper body clothing?: None Help from another person to put on and taking  off regular lower body clothing?: A Little 6 Click Score: 21   End of Session    Activity Tolerance: Patient tolerated treatment well Patient left: in bed;with call bell/phone within reach;with bed alarm set;with SCD's reapplied;Other (comment) (hemovac and polar care in place)  OT Visit Diagnosis: Other abnormalities of gait and mobility (R26.89);Pain Pain - Right/Left: Right Pain - part of body: Knee                Time: 1717-1733 OT Time Calculation (min): 16 min Charges:  OT General Charges $OT Visit: 1 Visit OT Evaluation $OT Eval Low Complexity: 1 Low OT Treatments $Self Care/Home Management : 8-22 mins  Ardeth Perfect., MPH, MS, OTR/L ascom [phone] 07/26/21, 5:44 PM

## 2021-07-26 NOTE — Anesthesia Procedure Notes (Signed)
Procedure Name: Intubation Date/Time: 07/26/2021 9:07 AM Performed by: Nelda Marseille, CRNA Pre-anesthesia Checklist: Patient identified, Patient being monitored, Timeout performed, Emergency Drugs available and Suction available Patient Re-evaluated:Patient Re-evaluated prior to induction Oxygen Delivery Method: Circle system utilized Preoxygenation: Pre-oxygenation with 100% oxygen Induction Type: Rapid sequence Ventilation: Mask ventilation without difficulty Laryngoscope Size: Mac, 3 and McGraph Grade View: Grade I Tube type: Oral Tube size: 7.0 mm Number of attempts: 1 Airway Equipment and Method: Stylet Placement Confirmation: ETT inserted through vocal cords under direct vision, positive ETCO2 and breath sounds checked- equal and bilateral Secured at: 21 cm Tube secured with: Tape Dental Injury: Teeth and Oropharynx as per pre-operative assessment  Comments: ETT placed due to bile secretions coming up during TIVA administration with a natural airway.

## 2021-07-26 NOTE — Anesthesia Preprocedure Evaluation (Signed)
Anesthesia Evaluation  Patient identified by MRN, date of birth, ID band Patient awake    Reviewed: Allergy & Precautions, H&P , NPO status , Patient's Chart, lab work & pertinent test results, reviewed documented beta blocker date and time   History of Anesthesia Complications (+) PONV and history of anesthetic complications  Airway Mallampati: III  TM Distance: >3 FB Neck ROM: full    Dental  (+) Partial Lower   Pulmonary neg pulmonary ROS, neg sleep apnea, neg COPD, Patient abstained from smoking.Not current smoker,    Pulmonary exam normal breath sounds clear to auscultation       Cardiovascular Exercise Tolerance: Good METShypertension, Pt. on medications (-) CAD and (-) Past MI (-) dysrhythmias  Rhythm:regular Rate:Normal - Systolic murmurs    Neuro/Psych negative neurological ROS  negative psych ROS   GI/Hepatic negative GI ROS, Neg liver ROS, neg GERD  ,  Endo/Other  neg diabetesMorbid obesity  Renal/GU negative Renal ROS  negative genitourinary   Musculoskeletal  (+) Arthritis ,   Abdominal (+) + obese,   Peds  Hematology negative hematology ROS (+)   Anesthesia Other Findings Past Medical History: No date: Allergic rhinitis No date: Anemia     Comment:  during pregnancy only No date: Arthritis     Comment:  joints 01/30/2017: BMI 45.0-49.9, adult (HCC) No date: Family history of adverse reaction to anesthesia     Comment:  dad-n/v No date: Hypercholesteremia No date: Hypertension     Comment:  controlled on medicine No date: Motion sickness No date: Obesity No date: PONV (postoperative nausea and vomiting)     Comment:  scopalamine patch is the only thing that works for her No date: Pre-diabetes     Comment:  last a1c on 04-13-21 was 5.7 No date: Wears partial dentures     Comment:  lower  Reproductive/Obstetrics negative OB ROS                             Anesthesia  Physical  Anesthesia Plan  ASA: 3  Anesthesia Plan: Spinal   Post-op Pain Management:    Induction: Intravenous  PONV Risk Score and Plan: 3 and Propofol infusion, TIVA, Dexamethasone, Aprepitant, Ondansetron and Scopolamine patch - Pre-op  Airway Management Planned: Natural Airway  Additional Equipment: None  Intra-op Plan:   Post-operative Plan:   Informed Consent: I have reviewed the patients History and Physical, chart, labs and discussed the procedure including the risks, benefits and alternatives for the proposed anesthesia with the patient or authorized representative who has indicated his/her understanding and acceptance.     Dental Advisory Given  Plan Discussed with: CRNA  Anesthesia Plan Comments: (Discussed R/B/A of neuraxial anesthesia technique with patient: - rare risks of spinal/epidural hematoma, nerve damage, infection - Risk of PDPH - Risk of nausea and vomiting - Risk of conversion to general anesthesia and its associated risks, including sore throat, damage to lips/teeth/oropharynx, and rare risks such as cardiac and respiratory events. - Risk of allergic reactions Patient informed about increased incidence of above perioperative risk due to high BMI. Patient understands.  )        Anesthesia Quick Evaluation   Zofran

## 2021-07-27 ENCOUNTER — Encounter: Payer: Self-pay | Admitting: Orthopedic Surgery

## 2021-07-27 DIAGNOSIS — M1711 Unilateral primary osteoarthritis, right knee: Secondary | ICD-10-CM | POA: Diagnosis not present

## 2021-07-27 LAB — GLUCOSE, CAPILLARY
Glucose-Capillary: 108 mg/dL — ABNORMAL HIGH (ref 70–99)
Glucose-Capillary: 106 mg/dL — ABNORMAL HIGH (ref 70–99)

## 2021-07-27 MED ORDER — ONDANSETRON HCL 4 MG PO TABS: 4.0000 mg | ORAL_TABLET | Freq: Three times a day (TID) | ORAL | 0 refills | Status: DC | PRN

## 2021-07-27 MED ORDER — OXYCODONE HCL 5 MG PO TABS: 5.0000 mg | ORAL_TABLET | ORAL | 0 refills | Status: DC | PRN

## 2021-07-27 MED ORDER — CELECOXIB 200 MG PO CAPS
200.0000 mg | ORAL_CAPSULE | Freq: Two times a day (BID) | ORAL | 0 refills | Status: DC
Start: 2021-07-27 — End: 2022-04-27

## 2021-07-27 MED ORDER — TRAMADOL HCL 50 MG PO TABS: 50.0000 mg | ORAL_TABLET | ORAL | 0 refills | Status: DC | PRN

## 2021-07-27 MED ORDER — ENOXAPARIN SODIUM 40 MG/0.4ML IJ SOSY: 40.0000 mg | PREFILLED_SYRINGE | INTRAMUSCULAR | 0 refills | Status: DC

## 2021-07-27 NOTE — Progress Notes (Signed)
Physical Therapy Treatment Patient Details Name: Karen Morrison MRN: [id number] DOB: 03/19/1971 Today's Date: 07/27/2021   History of Present Illness Pt is a 50 y.o. F s/p R TKA 07/26/21 PMH includes HTN, L TKA (2014),    PT Comments    Pt alert, in bed motivated for therapy. Family present in room with spouse present throughout treatment session. Pt states pain 3/10, 4/10 following session. Pt completed loop around nursing station w/ RW, requesting an additional loop. Initial cues for technique, but pt able to maintain heel strike w/ minimizing antalgic gait. Progressed to stair training with MIN-G for safety without LOB or instability noted. Overall pt continues to demonstrate excellent control and safety awareness with all mobility.    Recommendations for follow up therapy are one component of a multi-disciplinary discharge planning process, led by the attending physician.  Recommendations may be updated based on patient status, additional functional criteria and insurance authorization.  Follow Up Recommendations  Outpatient PT     Equipment Recommendations  Rolling walker with 5" wheels    Recommendations for Other Services       Precautions / Restrictions Precautions Precautions: Fall;Knee Precaution Booklet Issued: Yes (comment) Restrictions Weight Bearing Restrictions: Yes RLE Weight Bearing: Weight bearing as tolerated     Mobility  Bed Mobility Overal bed mobility: Needs Assistance Bed Mobility: Sit to Supine     Supine to sit: HOB elevated;Supervision          Transfers Overall transfer level: Needs assistance Equipment used: Rolling walker (2 wheeled) Transfers: Sit to/from Stand Sit to Stand: Supervision         General transfer comment: Cues for hand placement on RW x 1, no other reminders necessary  Ambulation/Gait Ambulation/Gait assistance: Supervision Gait Distance (Feet): 350 Feet Assistive device: Rolling walker (2 wheeled) Gait  Pattern/deviations: Step-through pattern;Decreased step length - left;Decreased step length - right     General Gait Details: Cues for step through pattern, WB through LE vs UE   Stairs Stairs: Yes Stairs assistance: Min guard Stair Management: No rails;With walker Number of Stairs: 4 General stair comments: able to recite correct LE pattern, spouse present during session practicing holding RW for support; good eccentric and concentric control   Wheelchair Mobility    Modified Rankin (Stroke Patients Only)       Balance Overall balance assessment: Needs assistance Sitting-balance support: Feet supported Sitting balance-Leahy Scale: Normal     Standing balance support: Bilateral upper extremity supported Standing balance-Leahy Scale: Fair Standing balance comment: Able to stand intermittently staticallyw/o UE support                            Cognition Arousal/Alertness: Awake/alert Behavior During Therapy: WFL for tasks assessed/performed Overall Cognitive Status: Within Functional Limits for tasks assessed                                        Exercises      General Comments        Pertinent Vitals/Pain Pain Assessment: 0-10 Pain Score: 4  Pain Location: R knee w/ movement Pain Descriptors / Indicators: Aching Pain Intervention(s): Limited activity within patient's tolerance;Monitored during session;Premedicated before session;Repositioned;Ice applied    Home Living                      Prior Function  PT Goals (current goals can now be found in the care plan section) Progress towards PT goals: Progressing toward goals    Frequency    BID      PT Plan Current plan remains appropriate    Co-evaluation              AM-PAC PT "6 Clicks" Mobility   Outcome Measure  Help needed turning from your back to your side while in a flat bed without using bedrails?: None Help needed moving from  lying on your back to sitting on the side of a flat bed without using bedrails?: A Little Help needed moving to and from a bed to a chair (including a wheelchair)?: None Help needed standing up from a chair using your arms (e.g., wheelchair or bedside chair)?: A Little Help needed to walk in hospital room?: A Little Help needed climbing 3-5 steps with a railing? : A Little 6 Click Score: 20    End of Session Equipment Utilized During Treatment: Gait belt Activity Tolerance: Patient tolerated treatment well Patient left: with call bell/phone within reach;in chair;with chair alarm set;with SCD's reapplied;with family/visitor present Nurse Communication: Mobility status PT Visit Diagnosis: Muscle weakness (generalized) (M62.81);Other abnormalities of gait and mobility (R26.89)     Time: 0930-1000 PT Time Calculation (min) (ACUTE ONLY): 30 min  Charges:                        [employer], SPT

## 2021-07-27 NOTE — Progress Notes (Signed)
Post-op dressing removed. , Hemovac removed., and Mini compression dressing applied. Honeycomb pristine

## 2021-07-27 NOTE — TOC Transition Note (Signed)
Transition of Care [facility]) - CM/SW Discharge Note   Patient Details  Name: Karen Morrison MRN: [id number] Date of Birth: July 05, 1971  [facility] CM/SW Contact:  Pete Pelt, RN Phone Number: 07/27/2021, 10:07 AM   Clinical Narrative:   Patient lives at home with spouse, no concerns about transportation to appointments or to the pharmacy.  Patient takes medications as directed with no concerns.    Due to insurance, patient was set up with outpatient PT from MD office.  Patient has no concerns about transportation to outpatient PT.    Rolling walker recommended by PT, [employer] notified, will deliver to patient's room prior to discharge.  Patient and spouse have not concerns returning home.    Final next level of care: Home/Self Care Barriers to Discharge: Barriers Resolved   Patient Goals and CMS Choice     Choice offered to / list presented to : NA  Discharge Placement                       Discharge Plan and Services   Discharge Planning Services: CM Consult            DME Arranged: Gilford Rile rolling DME Agency: [employer] Date DME Agency Contacted: 07/27/21   Representative spoke with at DME Agency: [place]:  (patient will receive outpatient therapy)          Social Determinants of Health (SDOH) Interventions     Readmission Risk Interventions No flowsheet data found.

## 2021-07-27 NOTE — Anesthesia Postprocedure Evaluation (Signed)
Anesthesia Post Note  Patient: Karen Morrison  Procedure(s) Performed: COMPUTER ASSISTED TOTAL KNEE ARTHROPLASTY (Right: Knee)  Patient location during evaluation: Nursing Unit Anesthesia Type: Spinal Level of consciousness: awake and alert and oriented Pain management: pain level controlled Vital Signs Assessment: post-procedure vital signs reviewed and stable Respiratory status: respiratory function stable Cardiovascular status: stable Postop Assessment: no backache, no headache, patient able to bend at knees, no apparent nausea or vomiting, able to ambulate and adequate PO intake Anesthetic complications: no   No notable events documented.   Last Vitals:  Vitals:   07/26/21 1954 07/27/21 0411  BP: (!) 129/58 108/60  Pulse: 80 95  Resp: 17 17  Temp: 36.6 C (!) 36.4 C  SpO2: 100% 98%    Last Pain:  Vitals:   07/27/21 0746  TempSrc:   PainSc: 3                  Lanora Manis

## 2021-07-27 NOTE — Discharge Summary (Addendum)
Physician Discharge Summary  Patient ID: Karen Morrison MRN: [id number] DOB/AGE: 1971-06-12 50 y.o.  Admit date: 07/26/2021 Discharge date: 07/27/2021  Admission Diagnoses:  Total knee replacement status [Z96.659]  Surgeries:Procedure(s): Right total knee arthroplasty using computer-assisted navigation   SURGEON:  Marciano Sequin. M.D.   ASSISTANT: Cassell Smiles, PA-C (present and scrubbed throughout the case, critical for assistance with exposure, retraction, instrumentation, and closure)   ANESTHESIA: spinal   ESTIMATED BLOOD LOSS: 50 mL   FLUIDS REPLACED: 1300 mL of crystalloid   TOURNIQUET TIME: 90 minutes   DRAINS: 2 medium Hemovac drains   SOFT TISSUE RELEASES: Anterior cruciate ligament, posterior cruciate ligament, deep medial collateral ligament, patellofemoral ligament   IMPLANTS UTILIZED: DePuy Attune size 4 posterior stabilized femoral component (cemented), size 4 rotating platform tibial component (cemented), 35 mm medialized dome patella (cemented), and a 6 mm stabilized rotating platform polyethylene insert.  Discharge Diagnoses: Patient Active Problem List   Diagnosis Date Noted   Total knee replacement status 07/26/2021   Pre-diabetes 07/14/2021   Weight loss counseling, encounter for 07/14/2021   Primary osteoarthritis of right knee 04/25/2021   Essential hypertension 04/13/2021   Menometrorrhagia 03/10/2017   Morbid obesity ([place]) 01/30/2017   Allergic rhinitis 11/12/2015   Calculus of gallbladder 11/12/2015   Hypercholesteremia 11/12/2015   Bone spur 11/12/2015   H/O total knee replacement 12/21/2014    Past Medical History:  Diagnosis Date   Allergic rhinitis    Anemia    during pregnancy only   Arthritis    joints   BMI 45.0-49.9, adult ([place]) 01/30/2017   Family history of adverse reaction to anesthesia    dad-n/v   Hypercholesteremia    Hypertension    controlled on medicine   Motion sickness    Obesity    PONV (postoperative nausea  and vomiting)    scopalamine patch is the only thing that works for her   Pre-diabetes    last a1c on 04-13-21 was 5.7   Wears partial dentures    lower     Transfusion:    Consultants (if any):   Discharged Condition: Improved  Hospital Course: Karen Morrison is an 50 y.o. female who was admitted 07/26/2021 with a diagnosis of right knee osteoarthritis and went to the operating room on 07/26/2021 and underwent right total knee arthroplasty. The patient received perioperative antibiotics for prophylaxis (see below). The patient tolerated the procedure well and was transported to PACU in stable condition. After meeting PACU criteria, the patient was subsequently transferred to the Orthopaedics/Rehabilitation unit.   The patient received DVT prophylaxis in the form of early mobilization, Lovenox, Foot Pumps, and TED hose. A sacral pad had been placed and heels were elevated off of the bed with rolled towels in order to protect skin integrity. Foley catheter was discontinued on postoperative day #0. Wound drains were discontinued on postoperative day #1. The surgical incision was healing well without signs of infection.  Physical therapy was initiated postoperatively for transfers, gait training, and strengthening. Occupational therapy was initiated for activities of daily living and evaluation for assisted devices. Rehabilitation goals were reviewed in detail with the patient. The patient made steady progress with physical therapy and physical therapy recommended discharge to Home.   The patient achieved the preliminary goals of this hospitalization and was felt to be medically and orthopaedically appropriate for discharge.  She was given perioperative antibiotics:  Anti-infectives (From admission, onward)    Start     Dose/Rate Route Frequency Ordered  Stop   07/26/21 1330  ceFAZolin (ANCEF) IVPB 2g/100 mL premix        2 g 200 mL/hr over 30 Minutes Intravenous Every 6 hours 07/26/21 1235  07/27/21 0739   07/26/21 0600  ceFAZolin (ANCEF) IVPB 3g/100 mL premix  Status:  Discontinued        3 g 200 mL/hr over 30 Minutes Intravenous On call to O.R. 07/26/21 0006 07/26/21 1962     .  Recent vital signs:  Vitals:   07/27/21 0411 07/27/21 1136  BP: 108/60 (!) 112/91  Pulse: 95 77  Resp: 17 14  Temp: (!) 97.5 F (36.4 C) (!) 97.4 F (36.3 C)  SpO2: 98% 100%    Recent laboratory studies:  No results for input(s): WBC, HGB, HCT, PLT, K, CL, CO2, BUN, CREATININE, GLUCOSE, CALCIUM, LABPT, INR in the last 72 hours.  Diagnostic Studies: DG Knee Right Port  Result Date: 07/26/2021 CLINICAL DATA:  Postop right knee arthroplasty. EXAM: PORTABLE RIGHT KNEE - 1-2 VIEW COMPARISON:  None. FINDINGS: Right knee arthroplasty. Surgical drain and skin staples are in place. Subcutaneous and joint air/fluid present. IMPRESSION: Right total knee arthroplasty with expected postoperative findings. Electronically Signed   By: Lorin Picket M.D.   On: 07/26/2021 11:46    Discharge Medications:   Allergies as of 07/27/2021       Reactions   Hydrocodone-acetaminophen Nausea And Vomiting   Amoxicillin Hives, Itching, Rash   Apple Cider Vinegar Rash   Azithromycin Hives, Itching, Rash   Doxycycline Rash   Penicillins Hives, Itching, Rash   IgE = 66 (WNL) on 07/15/2021 Has patient had a PCN reaction causing immediate rash, facial/tongue/throat swelling, SOB or lightheadedness with hypotension: Yes Has patient had a PCN reaction causing severe rash involving mucus membranes or skin necrosis: Yes Has patient had a PCN reaction that required hospitalization: No Has patient had a PCN reaction occurring within the last 10 years: No If all of the above answers are "NO", then may proceed with Cephalosporin use.        Medication List     STOP taking these medications    meloxicam 7.5 MG tablet Commonly known as: MOBIC       TAKE these medications    acetaminophen 500 MG  tablet Commonly known as: TYLENOL Take 500 mg by mouth every 6 (six) hours as needed.   celecoxib 200 MG capsule Commonly known as: CELEBREX Take 1 capsule (200 mg total) by mouth 2 (two) times daily.   diphenhydrAMINE 25 MG tablet Commonly known as: BENADRYL Take 25 mg by mouth daily as needed for itching (hives).   enoxaparin 40 MG/0.4ML injection Commonly known as: LOVENOX Inject 0.4 mLs (40 mg total) into the skin daily for 14 days.   hydrochlorothiazide 25 MG tablet Commonly known as: HYDRODIURIL Take 1 tablet (25 mg total) by mouth daily. What changed: when to take this   Metamucil Fiber 51.7 % Pack Generic drug: Psyllium Take 1 packet by mouth in the morning.   metFORMIN 750 MG 24 hr tablet Commonly known as: GLUCOPHAGE-XR Take 1 tablet (750 mg total) by mouth daily with breakfast. What changed: additional instructions   ondansetron 4 MG tablet Commonly known as: Zofran Take 1 tablet (4 mg total) by mouth every 8 (eight) hours as needed for nausea or vomiting.   oxyCODONE 5 MG immediate release tablet Commonly known as: Oxy IR/ROXICODONE Take 1 tablet (5 mg total) by mouth every 4 (four) hours as needed for severe pain (  pain score 4-6).   phentermine 37.5 MG capsule Take 1 capsule (37.5 mg total) by mouth every morning.   senna 8.6 MG tablet Commonly known as: SENOKOT Take 1 tablet by mouth daily as needed for constipation.   traMADol 50 MG tablet Commonly known as: ULTRAM Take 1 tablet (50 mg total) by mouth every 4 (four) hours as needed for moderate pain.               Durable Medical Equipment  (From admission, onward)           Start     Ordered   07/26/21 1234  DME Walker rolling  Once       Question:  Patient needs a walker to treat with the following condition  Answer:  Total knee replacement status   07/26/21 1235   07/26/21 1234  DME Bedside commode  Once       Question:  Patient needs a bedside commode to treat with the following  condition  Answer:  Total knee replacement status   07/26/21 1235            Disposition: Home with outpatient PT    Follow-up Information     Fausto Skillern, PA-C Follow up on 08/10/2021.   Specialty: Orthopedic Surgery Why: at 9:45am Contact information: [place] [address] [phone]         Dereck Leep, MD Follow up on 09/14/2021.   Specialty: Orthopedic Surgery Why: at 1:45pm Contact information: [place] [address] [place], PA-C 07/27/2021, 12:34 PM

## 2021-07-27 NOTE — Progress Notes (Signed)
  Subjective: 1 Day Post-Op Procedure(s) (LRB): COMPUTER ASSISTED TOTAL KNEE ARTHROPLASTY (Right) Patient reports pain as well-controlled.   Patient is well, and has had no acute complaints or problems Plan is to go Home after hospital stay. Negative for chest pain and shortness of breath Fever: no Gastrointestinal: positive for nausea yesterday, no vomiting.  Patient has not had a bowel movement.  Objective: Vital signs in last 24 hours: Temp:  [97.5 F (36.4 C)-98.5 F (36.9 C)] 97.5 F (36.4 C) (10/11 0411) Pulse Rate:  [80-102] 95 (10/11 0411) Resp:  [14-17] 17 (10/11 0411) BP: (101-138)/(58-92) 108/60 (10/11 0411) SpO2:  [95 %-100 %] 98 % (10/11 0411) Weight:  [120.6 kg] 120.6 kg (10/10 1210)  Intake/Output from previous day:  Intake/Output Summary (Last 24 hours) at 07/27/2021 0822 Last data filed at 07/27/2021 0525 Gross per 24 hour  Intake 2446.02 ml  Output 2535 ml  Net -88.98 ml    Intake/Output this shift: No intake/output data recorded.  Labs: No results for input(s): HGB in the last 72 hours. No results for input(s): WBC, RBC, HCT, PLT in the last 72 hours. No results for input(s): NA, K, CL, CO2, BUN, CREATININE, GLUCOSE, CALCIUM in the last 72 hours. No results for input(s): LABPT, INR in the last 72 hours.   EXAM General - Patient is Alert, Appropriate, and Oriented Extremity - Neurovascular intact Dorsiflexion/Plantar flexion intact Compartment soft Dressing/Incision -Postoperative dressing remains in place., Polar Care in place and working. , Hemovac in place.  Motor Function - intact, moving foot and toes well on exam. Able to perform independent SLR.  Cardiovascular- Regular rate and rhythm, no murmurs/rubs/gallops Respiratory- Lungs clear to auscultation bilaterally Gastrointestinal- soft, nontender, and active bowel sounds   Assessment/Plan: 1 Day Post-Op Procedure(s) (LRB): COMPUTER ASSISTED TOTAL KNEE ARTHROPLASTY (Right) Active  Problems:   Total knee replacement status  Estimated body mass index is 44.24 kg/m as calculated from the following:   Height as of this encounter: 5\' 5"  (1.651 m).   Weight as of this encounter: 120.6 kg. Advance diet Up with therapy       DVT Prophylaxis - Lovenox, Ted hose, and foot pumps Weight-Bearing as tolerated to right leg  Cassell Smiles, PA-C [facility] Orthopaedic Surgery 07/27/2021, 8:22 AM

## 2021-09-15 ENCOUNTER — Telehealth: Payer: Self-pay | Admitting: Anesthesiology

## 2021-09-15 NOTE — Progress Notes (Signed)
Called patient today at around 1120 to discuss her residual right foot issues after her previous knee surgery and spinal anesthetic.  Patient states after the surgery, her whole foot was numb and immobile but improved to where it currently is, where she is able to move toes 2-5, and can somewhat dorsiflex the big toe, but cannot plantar flex the big toe and has no sensation on it.   I validated the patient's concern, and informed her that our anesthetic record indicated a straightfoward, quick spinal placement, with no documented spinal placement complications. I also said that her pattern of motor and sensory deficits don't fit a typical picture of nerve root injury from a spinal, but rather a collection of peripheral nerves.   Dr Marry Guan plans to refer her to neurologist for further studies.  We ended phone call with no further questions. Patient appreciated checking up on her.

## 2021-09-23 ENCOUNTER — Other Ambulatory Visit: Payer: Self-pay | Admitting: Neurology

## 2021-09-23 DIAGNOSIS — M5416 Radiculopathy, lumbar region: Secondary | ICD-10-CM

## 2021-10-06 ENCOUNTER — Other Ambulatory Visit: Payer: Self-pay

## 2021-10-06 ENCOUNTER — Ambulatory Visit
Admission: RE | Admit: 2021-10-06 | Discharge: 2021-10-06 | Disposition: A | Discharge status: Home / Self Care | Payer: Managed Care, Other (non HMO) | Source: Ambulatory Visit | Attending: Neurology | Admitting: Neurology

## 2021-10-06 DIAGNOSIS — M5416 Radiculopathy, lumbar region: Secondary | ICD-10-CM | POA: Diagnosis not present

## 2021-10-12 ENCOUNTER — Ambulatory Visit: Payer: Managed Care, Other (non HMO) | Attending: Neurology | Admitting: Physical Therapy

## 2021-10-12 VITALS — BP 128/58 | HR 92

## 2021-10-12 DIAGNOSIS — R29898 Other symptoms and signs involving the musculoskeletal system: Secondary | ICD-10-CM | POA: Insufficient documentation

## 2021-10-12 NOTE — Therapy (Signed)
[facility] [address], [place], [address] Phone: [phone]   Fax:  [phone]  Physical Therapy Evaluation  Patient Details  Name: Karen Morrison MRN: [id number] Date of Birth: 1971/05/25 Referring Provider (PT): Dr. Manuella Ghazi    Encounter Date: 10/12/2021   PT End of Session - 10/12/21 1433     Visit Number 1    Number of Visits 16    Date for PT Re-Evaluation 12/07/21    Authorization Type [employer] Managed    Authorization - Visit Number 1    Authorization - Number of Visits 60    Progress Note Due on Visit 10    PT Start Time 1415    PT Stop Time 1500    PT Time Calculation (min) 45 min    Equipment Utilized During Treatment Gait belt    Activity Tolerance Patient tolerated treatment well    Behavior During Therapy WFL for tasks assessed/performed             Past Medical History:  Diagnosis Date   Allergic rhinitis    Anemia    during pregnancy only   Arthritis    joints   BMI 45.0-49.9, adult ([place]) 01/30/2017   Family history of adverse reaction to anesthesia    dad-n/v   Hypercholesteremia    Hypertension    controlled on medicine   Motion sickness    Obesity    PONV (postoperative nausea and vomiting)    scopalamine patch is the only thing that works for her   Pre-diabetes    last a1c on 04-13-21 was 5.7   Wears partial dentures    lower    Past Surgical History:  Procedure Laterality Date   [place]   COLONOSCOPY WITH PROPOFOL N/A 05/23/2019   Procedure: COLONOSCOPY WITH PROPOFOL;  Surgeon: Jonathon Bellows, MD;  Location: [facility];  Service: Endoscopy;  Laterality: N/A;   CYSTOSCOPY  04/04/2017   Procedure: CYSTOSCOPY;  Surgeon: Gae Dry, MD;  Location: [facility];  Service: Gynecology;;   KNEE ARTHROPLASTY Right 07/26/2021   Procedure: COMPUTER ASSISTED TOTAL KNEE ARTHROPLASTY;  Surgeon: Dereck Leep, MD;  Location:  [facility];  Service: Orthopedics;  Laterality: Right;   POLYPECTOMY  05/23/2019   Procedure: POLYPECTOMY;  Surgeon: Jonathon Bellows, MD;  Location: [facility];  Service: Endoscopy;;   REPLACEMENT TOTAL KNEE Left 01/02/2013   TOTAL LAPAROSCOPIC HYSTERECTOMY WITH SALPINGECTOMY Bilateral 04/04/2017   Procedure: HYSTERECTOMY TOTAL LAPAROSCOPIC WITH SALPINGECTOMY;  Surgeon: Gae Dry, MD;  Location: [facility];  Service: Gynecology;  Laterality: Bilateral;   VAGINAL BIRTH AFTER CESAREAN SECTION      Vitals:   10/12/21 1441 10/13/21 1031  BP: 126/78 (!) 128/58  Pulse: 98 92  SpO2: 99% 98%      Subjective Assessment - 10/12/21 1419     Subjective Pt reports that she had right TKA back in october and that she had resulting right foot numbness that has resolved somewhat to point where she can now have some sensation in the right foot.    Pertinent History Acute onset of right foot planter flexion weakness (with intrinsic foot muscles weakness as below) and hypoesthesia of planter surface of right foot after right total knee replacement and lumbar anesthesia. Some weakness of dorsiflexion of toes suggest more proximal lesion than tibial nerve at knee. Concern for lesion causing S1 distribution symptomatology.    Limitations Walking  How long can you sit comfortably? N/a    Diagnostic tests Lumbar Spine MRI 10/06/21 IMPRESSION:   Minimal degenerative changes in the lumbar spine with no significant   spinal canal or neural foraminal stenosis. No evidence of nerve root   impingement to explain the patient's symptoms.                    Dec 4th: EMG & NCV Findings:  Evaluation of the Right tibial motor nerve showed no response (Ankle). The Left superficial peroneal sensory and the Right superficial peroneal sensory nerves showed prolonged distal peak latency (L6.1, R7.1 ms) and decreased conduction velocity (14 cm-Ant Lat Mall, L23, R20 m/s). The Left sural sensory and the Right sural sensory  nerves showed no response (Calf). All remaining nerves (as indicated in the following tables) were within  Impression:  This is an abnormal electrodiagnostic study consistent with a 1) generalized sensory polyneuropathy. 2) Superimposed right S1 radiculopathy.    Patient Stated Goals Wants to be able to walk steadier    Currently in Pain? No/denies                  ANKLE & GREAT TOE EVAL    Red Flags (fever/chills, dysarthria, dysphagia, bowel and bladder changes, recent weight loss/gain, personal history of cancer, night pain): No   OBJECTIVE  MUSCULOSKELETAL:  Lumbar/Hip/Knee Screen AROM: WFL and painless with overpressure in all planes  Posture No gross abnormalities noted in seated or standing posture  Gait No excessive pronation/supination noted during gait. No excessive shoe wear noted on medial or lateral surfaces  Palpation No pain to palpation along medial and lateral malleoli. No pain over anterior or posterior ankle. Achilles tendon intact and painless to palpation  Strength R/L NT Hip flexion NT Hip external rotation NT Hip internal rotation NT Hip extension  NT Hip abduction NT Hip adduction NT  Knee extension NT Knee flexion 5/5  Ankle Plantarflexion 5/5 Ankle Dorsiflexion 4/5 Ankle Inversion 5/5 Ankle Eversion  AROM R/L 50/50 Ankle Plantarflexion 20/20 Ankle Dorsiflexion 35/35 Ankle Inversion 25/25 Ankle Eversion 50/70 Great Toe Ext  0/30   Great Toe Flex  *Indicates Pain   PROM R/L 50/50 Ankle Plantarflexion 20/20 Ankle Dorsiflexion 35/35 Ankle Inversion 15/15 Ankle Eversion NT Great Toe Ext  NT   Great Toe Flex  *Indicates Pain    NEUROLOGICAL:   Sensation Decreased sensation over dorsal medial portion of right foot.   Reflexes : NT  R/L NT Knee Jerk (L3/4) NT Ankle Jerk (S1/2)   Functional Testing:  10 Meter Walk Test   Gait Speed:    1st trial 12.1 sec , 2nd trial 12.3 sec , Avg=  12.2 sec - Normal Gait  speed Avg=   0.82 m/sec    <1 m/sec Need Intervention to reduce falls risk   0.12 m/sec improvement represents a statistically significant improvement in gait speed     - 0.8 - 1.3 m/sec - community ambulator - associated with increased independence in self-care   Single Leg Stance R/L: 10 sec/ 10 sec  -Bilateral hip drop noted   Gait Analysis: [place] with decreased toe off on RLE.    THEREX:  Standing Knee Flex and Ext into wall 3 x 10      PT Education - 10/12/21 1432     Education Details form and technique and explanation of objective findings of imaging    Person(s) Educated Patient    Methods Explanation;Demonstration;Verbal cues;Handout    Comprehension  Verbalized understanding;Returned demonstration;Verbal cues required;Tactile cues required              PT Short Term Goals - 10/12/21 1504       PT SHORT TERM GOAL #1   Title Patient will be independent with HEP to improve outcomes and manage condition independently.    Baseline 10/12/21: NT    Time 2    Period Weeks    Status New    Target Date 10/26/21               PT Long Term Goals - 10/13/21 1001       PT LONG TERM GOAL #1   Title Patient will have improved function and activity level as evidenced by an increase in FOTO score by 10 points or more.    Baseline 12/27: 59/72    Time 8    Period Weeks    Status New    Target Date 12/07/21      PT LONG TERM GOAL #2   Title Patient will demonstrate improvement in gait speed to >=1.0 m/s to exhibit a decrease in her falls risk.    Baseline 12/27: 0.82 m/sec    Time 8    Period Weeks    Status New    Target Date 12/07/21      PT LONG TERM GOAL #3   Title Patient will demonstrate symmetrical great toe extension for increased toe off during gait.    Baseline 10/13/21: Great toe ext AROM R/L 50/70, PROM NT    Time 8    Period Weeks    Status New    Target Date 12/08/21                    Plan - 10/13/21 0951      Clinical Impression Statement Pt is a 50 yo white female that presents for initial eval for right foot weakness and parasthesia resulting from a right TKA that she received in October. Pt demonstrates decreased ROM with right great toe extension and flexion and bilateral hip drop during stance portion of gait. Further testing of hip and knee musculature needs to be conducted to determine extent and degree of deficits. Given objective findings and diagnosis of polyneuropathy and S1 radiculopathy, it is unclear about how much PT will assist in return of impaired nerve function. Plan is to provide pt with strengthening, ROM, and neuromuscular reed to potentially improve function of great toe and compensatory musculature to increase steadiness of patient's gait.    Personal Factors and Comorbidities Time since onset of injury/illness/exacerbation;Comorbidity 2    Comorbidities Obesity, HTN    Examination-Activity Limitations Locomotion Level    Examination-Participation Restrictions Driving    Stability/Clinical Decision Making Stable/Uncomplicated    Clinical Decision Making Low    Rehab Potential Fair    PT Frequency 1x / week    PT Duration 8 weeks    PT Treatment/Interventions Therapeutic exercise;Therapeutic activities;Dry needling;Passive range of motion;Joint Manipulations;Spinal Manipulations;Moist Heat    PT Next Visit Plan Assess hip and knee strength, measure PROM of great toe ext and flexion. Reflex testing. Take video of gait    PT Home Exercise Plan [place] Knee flexion and extension into wall    Recommended Other Services Continued eval and treatment by neurologist    Consulted and Agree with Plan of Care Patient             Patient will benefit from skilled therapeutic intervention in order to improve  the following deficits and impairments:  Abnormal gait, Decreased range of motion  Visit Diagnosis: Weakness of right foot     Problem List Patient Active Problem List    Diagnosis Date Noted   Total knee replacement status 07/26/2021   Pre-diabetes 07/14/2021   Weight loss counseling, encounter for 07/14/2021   Primary osteoarthritis of right knee 04/25/2021   Essential hypertension 04/13/2021   Menometrorrhagia 03/10/2017   Morbid obesity ([place]) 01/30/2017   Allergic rhinitis 11/12/2015   Calculus of gallbladder 11/12/2015   Hypercholesteremia 11/12/2015   Bone spur 11/12/2015   H/O total knee replacement 12/21/2014   Bradly Chris PT, DPT  10/13/2021, 1:10 PM  [facility] [address] [place], [address] Phone: [phone]   Fax:  [phone]  Name: Karen Morrison MRN: [id number] Date of Birth: November 01, 1970

## 2021-10-14 ENCOUNTER — Encounter: Payer: Self-pay | Admitting: Family Medicine

## 2021-10-14 ENCOUNTER — Other Ambulatory Visit: Payer: Self-pay

## 2021-10-14 ENCOUNTER — Ambulatory Visit: Payer: Managed Care, Other (non HMO) | Admitting: Physical Therapy

## 2021-10-14 ENCOUNTER — Ambulatory Visit (INDEPENDENT_AMBULATORY_CARE_PROVIDER_SITE_OTHER): Payer: Managed Care, Other (non HMO) | Admitting: Family Medicine

## 2021-10-14 VITALS — BP 128/83 | HR 80 | Temp 98.7°F | Resp 16 | Ht 65.0 in | Wt 270.9 lb

## 2021-10-14 DIAGNOSIS — Z23 Encounter for immunization: Secondary | ICD-10-CM | POA: Diagnosis not present

## 2021-10-14 DIAGNOSIS — Z09 Encounter for follow-up examination after completed treatment for conditions other than malignant neoplasm: Secondary | ICD-10-CM | POA: Diagnosis not present

## 2021-10-14 DIAGNOSIS — R202 Paresthesia of skin: Secondary | ICD-10-CM | POA: Insufficient documentation

## 2021-10-14 DIAGNOSIS — Z713 Dietary counseling and surveillance: Secondary | ICD-10-CM | POA: Diagnosis not present

## 2021-10-14 DIAGNOSIS — R200 Anesthesia of skin: Secondary | ICD-10-CM | POA: Diagnosis not present

## 2021-10-14 DIAGNOSIS — I10 Essential (primary) hypertension: Secondary | ICD-10-CM

## 2021-10-14 MED ORDER — PHENTERMINE HCL 37.5 MG PO CAPS: 37.5000 mg | ORAL_CAPSULE | ORAL | 0 refills | Status: DC

## 2021-10-14 NOTE — Assessment & Plan Note (Signed)
Hiccup d/t surgery complications Weight gain Wants to resume phentermine repeat 90 day dosing Remains on metformin Has completed knee PT and remains in PT for foot d/t complications Continue to focus on diet/exercise

## 2021-10-14 NOTE — Assessment & Plan Note (Signed)
Repeat phentermine; aware of complications with use and best time to take/how to take medication given repeat use

## 2021-10-14 NOTE — Assessment & Plan Note (Signed)
Complication s/p nerve block from r knee replacement Is improving; slow recovery process Sees PT

## 2021-10-14 NOTE — Assessment & Plan Note (Signed)
Chronic, stable Remains on HCTZ BP within goal today

## 2021-10-14 NOTE — Progress Notes (Signed)
°  ° ° °Established patient visit ° ° °Patient: Karen Morrison   DOB: 11/12/1970   50 y.o. Female  MRN: [id number] °Visit Date: 10/14/2021 ° °Today's healthcare provider:  T , FNP  ° °Chief Complaint  °Patient presents with  ° Obesity  ° °I,Sulibeya S Dimas,acting as a scribe for  T , FNP.,have documented all relevant documentation on the behalf of  T , FNP,as directed by   T , FNP while in the presence of  T , FNP. ° °Subjective  °  °HPI  °Follow up for obesity ° °The patient was last seen for this 3 months ago. Patient was switched to metformin given pre-diabetes. ° °She reports excellent compliance with treatment. °She feels that condition is Unchanged. °She is not having side effects.  ° °Wt Readings from Last 3 Encounters:  °10/14/21 270 lb 14.4 oz (122.9 kg)  °07/26/21 265 lb 14 oz (120.6 kg)  °07/15/21 265 lb 14 oz (120.6 kg)  °----------------------------------------------------------------------------------------- °Medications: °Outpatient Medications Prior to Visit  °Medication Sig  ° acetaminophen (TYLENOL) 500 MG tablet Take 500 mg by mouth every 6 (six) hours as needed.  ° celecoxib (CELEBREX) 200 MG capsule Take 1 capsule (200 mg total) by mouth 2 (two) times daily.  ° Cyanocobalamin (B-12 COMPLIANCE INJECTION) 1000 MCG/ML KIT Inject as directed.  ° metFORMIN (GLUCOPHAGE-XR) 750 MG 24 hr tablet Take 1 tablet (750 mg total) by mouth daily with breakfast. (Patient taking differently: Take 750 mg by mouth daily with breakfast. Pt has not started taking med yet-will wait until after surgery to start)  ° Multiple Vitamin (MULTIVITAMIN) tablet Take 1 tablet by mouth daily.  ° Psyllium (METAMUCIL FIBER) 51.7 % PACK Take 1 packet by mouth in the morning.  ° senna (SENOKOT) 8.6 MG tablet Take 1 tablet by mouth daily as needed for constipation.  ° Vitamin D, Ergocalciferol, (DRISDOL) 1.25 MG (50000 UNIT) CAPS capsule Take 50,000 Units by mouth once a week.  °  [DISCONTINUED] diphenhydrAMINE (BENADRYL) 25 MG tablet Take 25 mg by mouth daily as needed for itching (hives).  ° hydrochlorothiazide (HYDRODIURIL) 25 MG tablet Take 1 tablet (25 mg total) by mouth daily. (Patient taking differently: Take 25 mg by mouth every morning.)  ° meloxicam (MOBIC) 7.5 MG tablet Take 7.5 mg by mouth daily.  ° [DISCONTINUED] enoxaparin (LOVENOX) 40 MG/0.4ML injection Inject 0.4 mLs (40 mg total) into the skin daily for 14 days.  ° [DISCONTINUED] ondansetron (ZOFRAN) 4 MG tablet Take 1 tablet (4 mg total) by mouth every 8 (eight) hours as needed for nausea or vomiting.  ° [DISCONTINUED] oxyCODONE (OXY IR/ROXICODONE) 5 MG immediate release tablet Take 1 tablet (5 mg total) by mouth every 4 (four) hours as needed for severe pain (pain score 4-6).  ° [DISCONTINUED] phentermine 37.5 MG capsule Take 1 capsule (37.5 mg total) by mouth every morning.  ° [DISCONTINUED] traMADol (ULTRAM) 50 MG tablet Take 1 tablet (50 mg total) by mouth every 4 (four) hours as needed for moderate pain.  ° °No facility-administered medications prior to visit.  ° ° °Review of Systems  °Constitutional:  Negative for activity change, appetite change and fatigue.  °Respiratory:  Negative for shortness of breath.   °Cardiovascular:  Negative for chest pain and palpitations.  °Neurological:  Positive for numbness.  ° °Last metabolic panel °Lab Results  °Component Value Date  ° GLUCOSE 100 (H) 07/15/2021  ° NA 138 07/15/2021  ° K 3.3 (L) 07/15/2021  ° CL 99 07/15/2021  °   CO2 31 07/15/2021  ° BUN 8 07/15/2021  ° CREATININE 0.71 07/15/2021  ° GFRNONAA >60 07/15/2021  ° CALCIUM 9.2 07/15/2021  ° PROT 7.4 07/15/2021  ° ALBUMIN 4.2 07/15/2021  ° LABGLOB 2.6 04/13/2021  ° AGRATIO 1.7 04/13/2021  ° BILITOT 0.9 07/15/2021  ° ALKPHOS 96 07/15/2021  ° AST 26 07/15/2021  ° ALT 27 07/15/2021  ° ANIONGAP 8 07/15/2021  ° °Last lipids °Lab Results  °Component Value Date  ° CHOL 229 (H) 04/13/2021  ° HDL 62 04/13/2021  ° LDLCALC 149 (H)  04/13/2021  ° TRIG 103 04/13/2021  ° CHOLHDL 3.7 04/13/2021  ° °Last hemoglobin A1c °Lab Results  °Component Value Date  ° HGBA1C 5.7 (H) 04/13/2021  ° °  °  Objective  °  °BP 128/83 (BP Location: Right Arm, Patient Position: Sitting, Cuff Size: Large)    Pulse 80    Temp 98.7 °F (37.1 °C) (Oral)    Resp 16    Ht 5' 5" (1.651 m)    Wt 270 lb 14.4 oz (122.9 kg)    LMP 03/16/2017 (Exact Date)    SpO2 100%    BMI 45.08 kg/m²  °BP Readings from Last 3 Encounters:  °10/14/21 128/83  °10/13/21 (!) 128/58  °07/27/21 (!) 112/91  ° °Wt Readings from Last 3 Encounters:  °10/14/21 270 lb 14.4 oz (122.9 kg)  °07/26/21 265 lb 14 oz (120.6 kg)  °07/15/21 265 lb 14 oz (120.6 kg)  ° °  ° °Physical Exam °Vitals and nursing note reviewed.  °Constitutional:   °   General: She is not in acute distress. °   Appearance: Normal appearance. She is obese. She is not ill-appearing, toxic-appearing or diaphoretic.  °HENT:  °   Head: Normocephalic and atraumatic.  °Cardiovascular:  °   Rate and Rhythm: Normal rate and regular rhythm.  °   Pulses: Normal pulses.  °   Heart sounds: Normal heart sounds. No murmur heard. °  No friction rub. No gallop.  °Pulmonary:  °   Effort: Pulmonary effort is normal. No respiratory distress.  °   Breath sounds: Normal breath sounds. No stridor. No wheezing, rhonchi or rales.  °Chest:  °   Chest wall: No tenderness.  °Abdominal:  °   General: Bowel sounds are normal.  °   Palpations: Abdomen is soft.  °Musculoskeletal:     °   General: No swelling, tenderness, deformity or signs of injury. Normal range of motion.  °   Right lower leg: No edema.  °   Left lower leg: No edema.  °Skin: °   General: Skin is warm and dry.  °   Capillary Refill: Capillary refill takes less than 2 seconds.  °   Coloration: Skin is not jaundiced or pale.  °   Findings: No bruising, erythema, lesion or rash.  °Neurological:  °   General: No focal deficit present.  °   Mental Status: She is alert and oriented to person, place, and time.  Mental status is at baseline.  °   Cranial Nerves: No cranial nerve deficit.  °   Sensory: No sensory deficit.  °   Motor: No weakness.  °   Coordination: Coordination normal.  °Psychiatric:     °   Mood and Affect: Mood normal.     °   Behavior: Behavior normal.     °   Thought Content: Thought content normal.     °   Judgment: Judgment normal.  °  ° °  No results found for any visits on 10/14/21.  Assessment & Plan     Problem List Items Addressed This Visit       Cardiovascular and Mediastinum   Essential hypertension    Chronic, stable Remains on HCTZ BP within goal today        Other   Morbid obesity ([place])    Hiccup d/t surgery complications Weight gain Wants to resume phentermine repeat 90 day dosing Remains on metformin Has completed knee PT and remains in PT for foot d/t complications Continue to focus on diet/exercise      Relevant Medications   phentermine 37.5 MG capsule   Weight loss counseling, encounter for    Repeat phentermine; aware of complications with use and best time to take/how to take medication given repeat use      Need for shingles vaccine   Relevant Orders   Varicella-zoster vaccine IM (Completed)   Surgical follow-up care - Primary    Underwent knee replacement in October for R total knee Some complications with numbness/tingling in R foot Seen by neuro- getting treated for vitamin deficiencies at this time      Numbness and tingling of foot    Complication s/p nerve block from r knee replacement Is improving; slow recovery process Sees PT        Return in about 3 months (around 01/12/2022) for weightloss , medication administration.      Vonna Kotyk, FNP, have reviewed all documentation for this visit. The documentation on 10/14/21 for the exam, diagnosis, procedures, and orders are all accurate and complete.    Gwyneth Sprout, [place] [phone] (phone) [phone] (fax)  [place]

## 2021-10-14 NOTE — Assessment & Plan Note (Signed)
Underwent knee replacement in October for R total knee Some complications with numbness/tingling in R foot Seen by neuro- getting treated for vitamin deficiencies at this time

## 2021-10-19 ENCOUNTER — Encounter: Payer: Managed Care, Other (non HMO) | Admitting: Physical Therapy

## 2021-10-20 ENCOUNTER — Encounter: Payer: Managed Care, Other (non HMO) | Admitting: Physical Therapy

## 2021-10-21 ENCOUNTER — Encounter: Payer: Managed Care, Other (non HMO) | Admitting: Physical Therapy

## 2021-10-25 ENCOUNTER — Other Ambulatory Visit: Payer: Self-pay | Admitting: Family Medicine

## 2021-10-25 DIAGNOSIS — Z1231 Encounter for screening mammogram for malignant neoplasm of breast: Secondary | ICD-10-CM

## 2021-10-26 ENCOUNTER — Encounter: Payer: Managed Care, Other (non HMO) | Admitting: Physical Therapy

## 2021-10-28 ENCOUNTER — Encounter: Payer: Managed Care, Other (non HMO) | Admitting: Physical Therapy

## 2021-10-28 ENCOUNTER — Ambulatory Visit: Payer: Managed Care, Other (non HMO) | Attending: Neurology | Admitting: Physical Therapy

## 2021-10-28 DIAGNOSIS — R29898 Other symptoms and signs involving the musculoskeletal system: Secondary | ICD-10-CM | POA: Insufficient documentation

## 2021-10-28 NOTE — Therapy (Addendum)
[facility] [address], [place], [address] Phone: [phone]   Fax:  [phone]  Physical Therapy Treatment/ Discharge Summary   Patient Details  Name: Karen Morrison MRN: [id number] Date of Birth: 1970-11-09 Referring Provider (PT): Dr. Manuella Ghazi   Encounter Date: 10/28/2021   PT End of Session - 10/28/21 1611     Visit Number 2    Number of Visits 16    Date for PT Re-Evaluation 12/07/21    Authorization Type [employer] Managed    Authorization - Visit Number 1    Authorization - Number of Visits 60    Progress Note Due on Visit 10    PT Start Time 1545    PT Stop Time 1615    PT Time Calculation (min) 30 min    Equipment Utilized During Treatment Gait belt    Activity Tolerance Patient tolerated treatment well    Behavior During Therapy WFL for tasks assessed/performed             Past Medical History:  Diagnosis Date   Allergic rhinitis    Anemia    during pregnancy only   Arthritis    joints   BMI 45.0-49.9, adult ([place]) 01/30/2017   Family history of adverse reaction to anesthesia    dad-n/v   Hypercholesteremia    Hypertension    controlled on medicine   Motion sickness    Obesity    PONV (postoperative nausea and vomiting)    scopalamine patch is the only thing that works for her   Pre-diabetes    last a1c on 04-13-21 was 5.7   Wears partial dentures    lower    Past Surgical History:  Procedure Laterality Date   [place]   COLONOSCOPY WITH PROPOFOL N/A 05/23/2019   Procedure: COLONOSCOPY WITH PROPOFOL;  Surgeon: Jonathon Bellows, MD;  Location: [facility];  Service: Endoscopy;  Laterality: N/A;   CYSTOSCOPY  04/04/2017   Procedure: CYSTOSCOPY;  Surgeon: Gae Dry, MD;  Location: [facility];  Service: Gynecology;;   KNEE ARTHROPLASTY Right 07/26/2021   Procedure: COMPUTER ASSISTED TOTAL KNEE ARTHROPLASTY;  Surgeon: Dereck Leep,  MD;  Location: [facility];  Service: Orthopedics;  Laterality: Right;   POLYPECTOMY  05/23/2019   Procedure: POLYPECTOMY;  Surgeon: Jonathon Bellows, MD;  Location: [facility];  Service: Endoscopy;;   REPLACEMENT TOTAL KNEE Left 01/02/2013   TOTAL LAPAROSCOPIC HYSTERECTOMY WITH SALPINGECTOMY Bilateral 04/04/2017   Procedure: HYSTERECTOMY TOTAL LAPAROSCOPIC WITH SALPINGECTOMY;  Surgeon: Gae Dry, MD;  Location: [facility];  Service: Gynecology;  Laterality: Bilateral;   VAGINAL BIRTH AFTER CESAREAN SECTION      There were no vitals filed for this visit.   Subjective Assessment - 10/28/21 1610     Subjective Pt reports that she is wondering whether she can discontinue therapy because of high deductible. She has noticed significant improvement with toe mobility from vitamin B12 injections.    Pertinent History Acute onset of right foot planter flexion weakness (with intrinsic foot muscles weakness as below) and hypoesthesia of planter surface of right foot after right total knee replacement and lumbar anesthesia. Some weakness of dorsiflexion of toes suggest more proximal lesion than tibial nerve at knee. Concern for lesion causing S1 distribution symptomatology.    Limitations Walking    How long can you sit comfortably? N/a    Diagnostic tests Lumbar Spine MRI 10/06/21 IMPRESSION:  Minimal degenerative changes in the lumbar spine with no significant   spinal canal or neural foraminal stenosis. No evidence of nerve root   impingement to explain the patient's symptoms.                    Dec 4th: EMG & NCV Findings:  Evaluation of the Right tibial motor nerve showed no response (Ankle). The Left superficial peroneal sensory and the Right superficial peroneal sensory nerves showed prolonged distal peak latency (L6.1, R7.1 ms) and decreased conduction velocity (14 cm-Ant Lat Mall, L23, R20 m/s). The Left sural sensory and the Right sural sensory nerves showed no response (Calf). All remaining  nerves (as indicated in the following tables) were within  Impression:  This is an abnormal electrodiagnostic study consistent with a 1) generalized sensory polyneuropathy. 2) Superimposed right S1 radiculopathy.    Patient Stated Goals Wants to be able to walk steadier    Currently in Pain? No/denies            THEREX:   Great toe ext AROM R/L 70/70   10 Meter Walk Test   Gait Speed:    1st trial 10.71 sec , 2nd trial 10.51 , Avg=  10.61 sec - Normal Gait speed Avg=  0.94 m/sec    <1 m/sec Need Intervention to reduce falls risk   - 0.8 - 1.3 m/sec - community ambulator - associated with increased independence in self-care   Gait Analysis: Trendlenberg gait with symmetrical toe off and heel contact  Standing Hip Abduction with BUE support 3 x 10  Surral Nerve Glides 1 x 10  -min VC to increase slumped posture   Single Leg Stance 5 x 10 sec hold  - min VC to maintain separation of legs   Ankle Pumps 3 x 10   Great Toe Flexion 2 x 60 sec   Encouraged aerobic exercise for increased blood flow to lower extremities to improve chances of nerve recovery and regrowth.      PT Education - 10/28/21 1611     Education Details form and technique for appropriate exercises    Person(s) Educated Patient    Methods Explanation;Demonstration;Verbal cues;Handout    Comprehension Verbalized understanding;Returned demonstration;Verbal cues required;Tactile cues required              PT Short Term Goals - 10/28/21 1614       PT SHORT TERM GOAL #1   Title Patient will be independent with HEP to improve outcomes and manage condition independently.    Baseline 10/12/21: NT 10/28/21: Able to perform exercises independently    Time 2    Period Weeks    Status Achieved    Target Date 10/26/21               PT Long Term Goals - 10/28/21 1615       PT LONG TERM GOAL #1   Title Patient will have improved function and activity level as evidenced by an increase in FOTO  score by 10 points or more.    Baseline 12/27: 59/72 10/28/21: 65/72    Time 8    Period Weeks    Status Not Met    Target Date 12/07/21      PT LONG TERM GOAL #2   Title Patient will demonstrate improvement in gait speed to >=1.0 m/s to exhibit a decrease in her falls risk.    Baseline 12/27: 0.82 m/sec 10/28/21: 0.94 m/sec    Time 8  Period Weeks    Status New    Target Date 12/07/21      PT LONG TERM GOAL #3   Title Patient will demonstrate symmetrical great toe extension for increased toe off during gait.    Baseline 10/13/21: Great toe ext AROM R/L 50/70, PROM NT 10/28/21: Great toe ext AROM R/L 70/70    Time 8    Period Weeks    Status New    Target Date 12/08/21                  Plan - 10/28/21 1612     Clinical Impression Statement Pt presents for f/u for right great toe immobility s/p right TKA and vitamin B12 deficency. She exhibits an increase in her great toe mobility. She continue to have a similar gait speed that places her at risk for falling. Given her concern about financing insurance deductible and recent improvement with medical intervention, PT recommends that pt continue to address minor deficits independently with HEP and she can be discharged.    Personal Factors and Comorbidities Time since onset of injury/illness/exacerbation;Comorbidity 2    Comorbidities Obesity, HTN    Examination-Activity Limitations Locomotion Level    Examination-Participation Restrictions Driving    Stability/Clinical Decision Making Stable/Uncomplicated    Rehab Potential Fair    PT Frequency 1x / week    PT Duration 8 weeks    PT Treatment/Interventions Therapeutic exercise;Therapeutic activities;Dry needling;Passive range of motion;Joint Manipulations;Spinal Manipulations;Moist Heat    PT Next Visit Plan Assess hip and knee strength, measure PROM of great toe ext and flexion. Reflex testing. Take video of gait    PT Home Exercise Plan B[place]yle Knee flexion and extension  into wall    Consulted and Agree with Plan of Care Patient            HEP includes the following:   Access Code: [email or web address] URL: [email or web address] Date: 10/28/2021 Prepared by: Bradly Chris  Exercises Seated Sural Nerve Flossing - 1 x daily - 7 x weekly - 1 sets - 10 reps Single Leg Stance - 1 x daily - 3 x weekly - 1 sets - 10 reps - 10 hold Standing Hip Abduction with Counter Support - 1 x daily - 3 x weekly - 3 sets - 10 reps Seated Toe Flexion Extension PROM - 1 x daily - 7 x weekly - 1 sets - 10 reps - 3 hold   Patient will benefit from skilled therapeutic intervention in order to improve the following deficits and impairments:  Abnormal gait, Decreased range of motion  Visit Diagnosis: Weakness of right foot     Problem List Patient Active Problem List   Diagnosis Date Noted   Need for shingles vaccine 10/14/2021   Surgical follow-up care 10/14/2021   Numbness and tingling of foot 10/14/2021   Weight loss counseling, encounter for 07/14/2021   Essential hypertension 04/13/2021   Morbid obesity ([place]) 01/30/2017   Bradly Chris PT, DPT  10/28/2021, 4:27 PM  [facility] [address] [place], [address] Phone: [phone]   Fax:  [phone]  Name: Karen Morrison MRN: [id number] Date of Birth: 1971-02-18

## 2021-11-03 ENCOUNTER — Encounter: Payer: Managed Care, Other (non HMO) | Admitting: Physical Therapy

## 2021-11-10 ENCOUNTER — Encounter: Payer: Managed Care, Other (non HMO) | Admitting: Physical Therapy

## 2021-11-13 ENCOUNTER — Other Ambulatory Visit: Payer: Self-pay | Admitting: Family Medicine

## 2021-11-13 NOTE — Telephone Encounter (Signed)
Requested medication (s) are due for refill today: yes  Requested medication (s) are on the active medication list: historical med  Last refill:  10/14/21 historical provider  Future visit scheduled: yes  Notes to clinic:  historical provider and med   Requested Prescriptions  Pending Prescriptions Disp Refills   meloxicam (MOBIC) 7.5 MG tablet [Pharmacy Med Name: MELOXICAM 7.5MG  TABLETS] 90 tablet     Sig: TAKE 1 TABLET(7.5 MG) BY MOUTH DAILY     Analgesics:  COX2 Inhibitors Failed - 11/13/2021  9:13 AM      Failed - HGB in normal range and within 360 days    Hemoglobin  Date Value Ref Range Status  07/15/2021 15.2 (H) 12.0 - 15.0 g/dL Final  04/13/2021 14.9 11.1 - 15.9 g/dL Final          Passed - Cr in normal range and within 360 days    Creatinine  Date Value Ref Range Status  01/03/2013 0.74 0.60 - 1.30 mg/dL Final   Creatinine, Ser  Date Value Ref Range Status  07/15/2021 0.71 0.44 - 1.00 mg/dL Final          Passed - Patient is not pregnant      Passed - Valid encounter within last 12 months    Recent Outpatient Visits           1 month ago Surgical follow-up care   [facility] Gwyneth Sprout, FNP   4 months ago Morbid obesity [facility])   [facility] Gwyneth Sprout, FNP   7 months ago Annual physical exam   [facility], Dionne Bucy, MD   1 year ago Annual physical exam   [facility], Clearnce Sorrel, [place]   2 years ago Annual physical exam   [facility] [place], Clearnce Sorrel, [place]       Future Appointments             In 1 month Gwyneth Sprout, [place], [place]   In 5 months Rollene Rotunda, Jaci Standard, [place], [place]

## 2021-11-17 ENCOUNTER — Encounter: Payer: Managed Care, Other (non HMO) | Admitting: Physical Therapy

## 2021-12-03 ENCOUNTER — Other Ambulatory Visit: Payer: Self-pay

## 2021-12-03 ENCOUNTER — Ambulatory Visit
Admission: RE | Admit: 2021-12-03 | Discharge: 2021-12-03 | Disposition: A | Discharge status: Home / Self Care | Payer: Managed Care, Other (non HMO) | Source: Ambulatory Visit | Attending: Family Medicine | Admitting: Family Medicine

## 2021-12-03 DIAGNOSIS — Z1231 Encounter for screening mammogram for malignant neoplasm of breast: Secondary | ICD-10-CM | POA: Diagnosis not present

## 2022-01-07 ENCOUNTER — Other Ambulatory Visit: Payer: Self-pay | Admitting: Family Medicine

## 2022-01-07 DIAGNOSIS — R7303 Prediabetes: Secondary | ICD-10-CM

## 2022-01-10 NOTE — Progress Notes (Signed)
?  ?[employer] as a Education administrator for Gwyneth Sprout, FNP.,have documented all relevant documentation on the behalf of Gwyneth Sprout, FNP,as directed by  Gwyneth Sprout, FNP while in the presence of Gwyneth Sprout, FNP.  ? ? ?Established patient visit ? ? ?Patient: Karen Morrison   DOB: 1971/02/20   51 y.o. Female  MRN: [id number] ?Visit Date: 01/11/2022 ? ?Today's healthcare provider: Gwyneth Sprout, FNP  ?Re Introduced to nurse practitioner role and practice setting.  All questions answered.  Discussed provider/patient relationship and expectations. ? ? ?Chief Complaint  ?Patient presents with  ? Hypertension  ? Obesity  ? ?Subjective  ?  ?HPI  ?Hypertension, follow-up ? ?BP Readings from Last 3 Encounters:  ?01/11/22 124/86  ?10/14/21 128/83  ?10/13/21 (!) 128/58  ? Wt Readings from Last 3 Encounters:  ?01/11/22 267 lb 3.2 oz (121.2 kg)  ?10/14/21 270 lb 14.4 oz (122.9 kg)  ?07/26/21 265 lb 14 oz (120.6 kg)  ?  ? ?She was last seen for hypertension 2 months ago.  ?BP at that visit was 128/83. Management since that visit includes none. ? ?She reports excellent compliance with treatment. ?She is not having side effects.  ?She is following a Regular diet. ?She is exercising. Home PT ?She does not smoke. ? ?Use of agents associated with hypertension: none.  ? ?Outside blood pressures are not checked. ?Symptoms: ?No chest pain No chest pressure  ?No palpitations No syncope  ?No dyspnea No orthopnea  ?No paroxysmal nocturnal dyspnea No lower extremity edema  ? ?Pertinent labs ?Lab Results  ?Component Value Date  ? CHOL 229 (H) 04/13/2021  ? HDL 62 04/13/2021  ? LDLCALC 149 (H) 04/13/2021  ? TRIG 103 04/13/2021  ? CHOLHDL 3.7 04/13/2021  ? Lab Results  ?Component Value Date  ? NA 138 07/15/2021  ? K 3.3 (L) 07/15/2021  ? CREATININE 0.71 07/15/2021  ? GFRNONAA >60 07/15/2021  ? GLUCOSE 100 (H) 07/15/2021  ? TSH 3.090 04/13/2021  ?  ? ?The 10-year ASCVD risk score (Arnett DK, et al., 2019) is:  1.9% ? ?---------------------------------------------------------------------------------------------------  ?Follow up for Morbid Obesity ? ?The patient was last seen for this 3 months ago. ?Changes made at last visit include Hiccup d/t surgery complications ?Weight gain ?Wants to resume phentermine repeat 90 day dosing ?Remains on metformin ?Has completed knee PT and remains in PT for foot d/t complications ?Continue to focus on diet/exercise. ? ?She reports poor compliance with treatment. ?She feels that condition is Unchanged. ?She is not having side effects.  ? ?-----------------------------------------------------------------------------------------  ? ?Medications: ?Outpatient Medications Prior to Visit  ?Medication Sig  ? acetaminophen (TYLENOL) 500 MG tablet Take 500 mg by mouth every 6 (six) hours as needed.  ? celecoxib (CELEBREX) 200 MG capsule Take 1 capsule (200 mg total) by mouth 2 (two) times daily.  ? cholecalciferol (VITAMIN D3) 25 MCG (1000 UNIT) tablet Take 1,000 Units by mouth daily.  ? Cyanocobalamin (B-12 COMPLIANCE INJECTION) 1000 MCG/ML KIT Inject as directed.  ? hydrochlorothiazide (HYDRODIURIL) 25 MG tablet Take 1 tablet (25 mg total) by mouth daily. (Patient taking differently: Take 25 mg by mouth every morning.)  ? meloxicam (MOBIC) 7.5 MG tablet Take 7.5 mg by mouth daily.  ? metFORMIN (GLUCOPHAGE-XR) 750 MG 24 hr tablet TAKE 1 TABLET(750 MG) BY MOUTH DAILY WITH BREAKFAST  ? Multiple Vitamin (MULTIVITAMIN) tablet Take 1 tablet by mouth daily.  ? phentermine 37.5 MG capsule Take 1 capsule (37.5 mg total) by mouth  every morning.  ? Psyllium (METAMUCIL FIBER) 51.7 % PACK Take 1 packet by mouth in the morning.  ? senna (SENOKOT) 8.6 MG tablet Take 1 tablet by mouth daily as needed for constipation.  ? [DISCONTINUED] Vitamin D, Ergocalciferol, (DRISDOL) 1.25 MG (50000 UNIT) CAPS capsule Take 50,000 Units by mouth once a week. (Patient not taking: Reported on 01/11/2022)  ? ?No  facility-administered medications prior to visit.  ? ? ?Review of Systems ? ? ?  Objective  ?  ?BP 124/86   Pulse 89   Temp (!) 97 ?F (36.1 ?C) (Temporal)   Resp 15   Wt 267 lb 3.2 oz (121.2 kg)   LMP 03/16/2017 (Exact Date)   BMI 44.46 kg/m?  ? ? ?Physical Exam ?Vitals and nursing note reviewed.  ?Constitutional:   ?   General: She is not in acute distress. ?   Appearance: Normal appearance. She is obese. She is not ill-appearing, toxic-appearing or diaphoretic.  ?HENT:  ?   Head: Normocephalic and atraumatic.  ?Cardiovascular:  ?   Rate and Rhythm: Normal rate and regular rhythm.  ?   Pulses: Normal pulses.  ?   Heart sounds: Normal heart sounds. No murmur heard. ?  No friction rub. No gallop.  ?Pulmonary:  ?   Effort: Pulmonary effort is normal. No respiratory distress.  ?   Breath sounds: Normal breath sounds. No stridor. No wheezing, rhonchi or rales.  ?Chest:  ?   Chest wall: No tenderness.  ?Abdominal:  ?   General: Bowel sounds are normal.  ?   Palpations: Abdomen is soft.  ?Musculoskeletal:     ?   General: No swelling, tenderness, deformity or signs of injury. Normal range of motion.  ?   Right lower leg: No edema.  ?   Left lower leg: No edema.  ?Skin: ?   General: Skin is warm and dry.  ?   Capillary Refill: Capillary refill takes less than 2 seconds.  ?   Coloration: Skin is not jaundiced or pale.  ?   Findings: No bruising, erythema, lesion or rash.  ?Neurological:  ?   General: No focal deficit present.  ?   Mental Status: She is alert and oriented to person, place, and time. Mental status is at baseline.  ?   Cranial Nerves: No cranial nerve deficit.  ?   Sensory: No sensory deficit.  ?   Motor: No weakness.  ?   Coordination: Coordination normal.  ?Psychiatric:     ?   Mood and Affect: Mood normal.     ?   Behavior: Behavior normal.     ?   Thought Content: Thought content normal.     ?   Judgment: Judgment normal.  ?  ? ?No results found for any visits on 01/11/22. ? Assessment & Plan  ?   ? ?Problem List Items Addressed This Visit   ? ?  ? Cardiovascular and Mediastinum  ? Essential hypertension  ?  Chronic, stable 124/86 today ?Denies CP ?Denies SOB/ DOE ?Denies low blood pressure/hypotension ?Denies vision changes ?No LE Edema noted on exam ?Continue medication, HCTZ at 25 mg QD ?Denies side effects ?Seek emergent care if you develop chest pain or chest pressure ? ?  ?  ?  ? Other  ? Avitaminosis D  ?  Currently taking 1000 IU OTC ?Will repeat labs to gauge effectiveness of Rx strength ?Chronic knee pain s/s morbid obesity ? ?  ?  ? Relevant Orders  ?  Vitamin D (25 hydroxy)  ? B12 deficiency  ?  Chronic, currently receiving injections with Manuella Ghazi at [facility] ?Wishes to check progress of deficiency today with lab work ?Has noted an improvement with numbness in RLE; however, strength remains weak in toes- pt continues home PT to assist ? ?  ?  ? Relevant Orders  ? B12  ? Hypokalemia  ?  Mild low level seen on Chem panel last fall ?Will repeat with hx of HTN on HCTZ and Morbid Obesity ?Not currently on K+ supplementation ?  ?  ? Relevant Orders  ? Comprehensive metabolic panel  ? Morbid obesity ([place]) - Primary  ?  Body mass index is 44.46 kg/m?. ?associated with HTN and pre-diabetes, in addition to chronic pain in both knees ?Discussed importance of healthy weight management ?Discussed diet and exercise ?Did not lose any weight with use of phentermine ?Continue to recommend low fat diet and 150 mins of purposeful exercise/week  ?  ?  ? Relevant Orders  ? Hemoglobin A1c  ? Comprehensive metabolic panel  ? Pre-diabetes  ?  Chronic, previously stable with diet/exercise ?Associated with HTN and obesity ?Last A1c was 5.7% ?Will repeat A1c today- weight has been stable ?Pt continues home PT at home for post-op recovery from surgery last October for R knee ?Continue to recommend balanced, lower carb meals. Smaller meal size, adding snacks. Choosing water as drink of choice and increasing purposeful  exercise. ?Continues to have minor complaints of numbness/tingling  ?  ?  ? Relevant Orders  ? Hemoglobin A1c  ? Vitamin B6 deficiency (non anemic)  ?  Chronic, identified by neurology in 09/2021 ?Patient has been taking vitamins and is noted an improveme

## 2022-01-10 NOTE — Telephone Encounter (Signed)
Requested Prescriptions  ?Pending Prescriptions Disp Refills  ?? metFORMIN (GLUCOPHAGE-XR) 750 MG 24 hr tablet [Pharmacy Med Name: METFORMIN ER 750MG 24HR TABS] 90 tablet 1  ?  Sig: TAKE 1 TABLET(750 MG) BY MOUTH DAILY WITH BREAKFAST  ?  ? Endocrinology:  Diabetes - Biguanides Failed - 01/07/2022  3:38 AM  ?  ?  Failed - HBA1C is between 0 and 7.9 and within 180 days  ?  Hgb A1c MFr Bld  ?Date Value Ref Range Status  ?04/13/2021 5.7 (H) 4.8 - 5.6 % Final  ?  Comment:  ?           Prediabetes: 5.7 - 6.4 ?         Diabetes: >6.4 ?         Glycemic control for adults with diabetes: <7.0 ?  ?   ?  ?  Failed - B12 Level in normal range and within 720 days  ?  No results found for: VITAMINB12   ?  ?  Passed - Cr in normal range and within 360 days  ?  Creatinine  ?Date Value Ref Range Status  ?01/03/2013 0.74 0.60 - 1.30 mg/dL Final  ? ?Creatinine, Ser  ?Date Value Ref Range Status  ?07/15/2021 0.71 0.44 - 1.00 mg/dL Final  ?   ?  ?  Passed - eGFR in normal range and within 360 days  ?  EGFR (African American)  ?Date Value Ref Range Status  ?01/03/2013 >60  Final  ? ?GFR calc Af Wyvonnia Lora  ?Date Value Ref Range Status  ?04/03/2020 109 >59 mL/min/1.73 Final  ?  Comment:  ?  **[facility] currently reports eGFR in compliance with the current** ?  recommendations of the [employer]. [facility] will ?  update reporting as new guidelines are published from the NKF-ASN ?  Task force. ?  ? ?EGFR (Non-African Amer.)  ?Date Value Ref Range Status  ?01/03/2013 >60  Final  ?  Comment:  ?  eGFR values <48m/min/1.73 m2 may be an indication of chronic ?kidney disease (CKD). ?Calculated eGFR is useful in patients with stable renal function. ?The eGFR calculation will not be reliable in acutely ill patients ?when serum creatinine is changing rapidly. It is not useful in  ?patients on dialysis. The eGFR calculation may not be applicable ?to patients at the low and high extremes of body sizes, pregnant ?women, and vegetarians. ?   ? ?GFR, Estimated  ?Date Value Ref Range Status  ?07/15/2021 >60 >60 mL/min Final  ?  Comment:  ?  (NOTE) ?Calculated using the CKD-EPI Creatinine Equation (2021) ?  ? ?eGFR  ?Date Value Ref Range Status  ?04/13/2021 105 >59 mL/min/1.73 Final  ?   ?  ?  Passed - Valid encounter within last 6 months  ?  Recent Outpatient Visits   ?      ? 2 months ago [facility]  ? [facility]  ? 6 months ago Morbid obesity ([facility]  ? [facility]  ? 9 months ago [facility]  ? [facility]  ? 1 year ago [facility] physical exam  ? [facility] [place] ? 2 years ago Annual physical exam  ? [facility] [place] ?  ?  ?Future Appointments   ?        ? Tomorrow [place]  [facility]  ? In 3 months Gwyneth Sprout, [place], [facility]  ?  ? ?  ?  ?  Passed - CBC within normal limits and completed in the last 12 months  ?  WBC  ?Date Value Ref Range Status  ?07/15/2021 7.6 4.0 - 10.5 K/uL Final  ? ?RBC  ?Date Value Ref Range Status  ?07/15/2021 5.38 (H) 3.87 - 5.11 MIL/uL Final  ? ?Hemoglobin  ?Date Value Ref Range Status  ?07/15/2021 15.2 (H) 12.0 - 15.0 g/dL Final  ?04/13/2021 14.9 11.1 - 15.9 g/dL Final  ? ?HCT  ?Date Value Ref Range Status  ?07/15/2021 46.4 (H) 36.0 - 46.0 % Final  ? ?Hematocrit  ?Date Value Ref Range Status  ?04/13/2021 44.3 34.0 - 46.6 % Final  ? ?MCHC  ?Date Value Ref Range Status  ?07/15/2021 32.8 30.0 - 36.0 g/dL Final  ? ?MCH  ?Date Value Ref Range Status  ?07/15/2021 28.3 26.0 - 34.0 pg Final  ? ?MCV  ?Date Value Ref Range Status  ?07/15/2021 86.2 80.0 - 100.0 fL Final  ?04/13/2021 85 79 - 97 fL Final  ?12/10/2012 86 80 - 100 fL Final  ? ?No results found for: PLTCOUNTKUC, LABPLAT, [place] ?RDW  ?Date Value Ref Range Status  ?07/15/2021 14.4 11.5 - 15.5 % Final  ?04/13/2021 13.8  11.7 - 15.4 % Final  ?12/10/2012 14.6 (H) 11.5 - 14.5 % Final  ? ?  ?  ?  ? ?

## 2022-01-11 ENCOUNTER — Encounter: Payer: Self-pay | Admitting: Family Medicine

## 2022-01-11 ENCOUNTER — Ambulatory Visit: Payer: Managed Care, Other (non HMO) | Admitting: Family Medicine

## 2022-01-11 ENCOUNTER — Other Ambulatory Visit: Payer: Self-pay

## 2022-01-11 DIAGNOSIS — R7303 Prediabetes: Secondary | ICD-10-CM | POA: Diagnosis not present

## 2022-01-11 DIAGNOSIS — E876 Hypokalemia: Secondary | ICD-10-CM | POA: Insufficient documentation

## 2022-01-11 DIAGNOSIS — E531 Pyridoxine deficiency: Secondary | ICD-10-CM | POA: Insufficient documentation

## 2022-01-11 DIAGNOSIS — E559 Vitamin D deficiency, unspecified: Secondary | ICD-10-CM | POA: Insufficient documentation

## 2022-01-11 DIAGNOSIS — I10 Essential (primary) hypertension: Secondary | ICD-10-CM

## 2022-01-11 DIAGNOSIS — E538 Deficiency of other specified B group vitamins: Secondary | ICD-10-CM | POA: Insufficient documentation

## 2022-01-11 NOTE — Assessment & Plan Note (Signed)
Body mass index is 44.46 kg/m?. ?associated with HTN and pre-diabetes, in addition to chronic pain in both knees ?Discussed importance of healthy weight management ?Discussed diet and exercise ?Did not lose any weight with use of phentermine ?Continue to recommend low fat diet and 150 mins of purposeful exercise/week  ?

## 2022-01-11 NOTE — Assessment & Plan Note (Signed)
Currently taking 1000 IU OTC ?Will repeat labs to gauge effectiveness of Rx strength ?Chronic knee pain s/s morbid obesity ? ?

## 2022-01-11 NOTE — Assessment & Plan Note (Signed)
Chronic, identified by neurology in 09/2021 ?Patient has been taking vitamins and is noted an improvement in numbness in her RLE ?Continues to work PT exercises at home ?Wishes to repeat labs to gauge progress ?

## 2022-01-11 NOTE — Assessment & Plan Note (Signed)
Chronic, stable 124/86 today ?Denies CP ?Denies SOB/ DOE ?Denies low blood pressure/hypotension ?Denies vision changes ?No LE Edema noted on exam ?Continue medication, HCTZ at 25 mg QD ?Denies side effects ?Seek emergent care if you develop chest pain or chest pressure ? ?

## 2022-01-11 NOTE — Assessment & Plan Note (Signed)
Mild low level seen on Chem panel last fall ?Will repeat with hx of HTN on HCTZ and Morbid Obesity ?Not currently on K+ supplementation ?

## 2022-01-11 NOTE — Assessment & Plan Note (Signed)
Chronic, currently receiving injections with Manuella Ghazi at [facility] ?Wishes to check progress of deficiency today with lab work ?Has noted an improvement with numbness in RLE; however, strength remains weak in toes- pt continues home PT to assist ? ?

## 2022-01-11 NOTE — Assessment & Plan Note (Addendum)
Chronic, previously stable with diet/exercise ?Associated with HTN and obesity ?Last A1c was 5.7% ?Will repeat A1c today- weight has been stable ?Pt continues home PT at home for post-op recovery from surgery last October for R knee ?Continue to recommend balanced, lower carb meals. Smaller meal size, adding snacks. Choosing water as drink of choice and increasing purposeful exercise. ?Continues to have minor complaints of numbness/tingling  ?

## 2022-01-14 LAB — COMPREHENSIVE METABOLIC PANEL
ALT: 18 IU/L (ref 0–32)
AST: 23 IU/L (ref 0–40)
Albumin/Globulin Ratio: 1.9 (ref 1.2–2.2)
Albumin: 4.4 g/dL (ref 3.8–4.9)
Alkaline Phosphatase: 121 IU/L (ref 44–121)
BUN/Creatinine Ratio: 14 (ref 9–23)
BUN: 10 mg/dL (ref 6–24)
Bilirubin Total: 0.4 mg/dL (ref 0.0–1.2)
CO2: 28 mmol/L (ref 20–29)
Calcium: 9.3 mg/dL (ref 8.7–10.2)
Chloride: 99 mmol/L (ref 96–106)
Creatinine, Ser: 0.69 mg/dL (ref 0.57–1.00)
Globulin, Total: 2.3 g/dL (ref 1.5–4.5)
Glucose: 88 mg/dL (ref 70–99)
Potassium: 4.2 mmol/L (ref 3.5–5.2)
Sodium: 140 mmol/L (ref 134–144)
Total Protein: 6.7 g/dL (ref 6.0–8.5)
eGFR: 104 mL/min/{1.73_m2} (ref >59)

## 2022-01-14 LAB — VITAMIN B12: Vitamin B-12: 1094 pg/mL (ref 232–1245)

## 2022-01-14 LAB — VITAMIN B6: Vitamin B6: 29.4 ug/L (ref 3.4–65.2)

## 2022-01-14 LAB — HEMOGLOBIN A1C
Est. average glucose Bld gHb Est-mCnc: 108 mg/dL
Hgb A1c MFr Bld: 5.4 % (ref 4.8–5.6)

## 2022-01-14 LAB — VITAMIN D 25 HYDROXY (VIT D DEFICIENCY, FRACTURES): Vit D, 25-Hydroxy: 44.3 ng/mL (ref 30.0–100.0)

## 2022-03-31 ENCOUNTER — Other Ambulatory Visit: Payer: Self-pay | Admitting: Family Medicine

## 2022-04-01 MED ORDER — PHENTERMINE HCL 37.5 MG PO CAPS: 37.5000 mg | ORAL_CAPSULE | ORAL | 0 refills | Status: DC

## 2022-04-04 ENCOUNTER — Other Ambulatory Visit: Payer: Self-pay | Admitting: Family Medicine

## 2022-04-04 DIAGNOSIS — I10 Essential (primary) hypertension: Secondary | ICD-10-CM

## 2022-04-08 ENCOUNTER — Other Ambulatory Visit: Payer: Self-pay | Admitting: Family Medicine

## 2022-04-08 DIAGNOSIS — R7303 Prediabetes: Secondary | ICD-10-CM

## 2022-04-12 NOTE — Progress Notes (Deleted)
Complete physical exam   Patient: Karen Morrison   DOB: 1971/03/20   51 y.o. Female  MRN: [id number] Visit Date: 04/15/2022  Today's healthcare provider: Gwyneth Sprout, FNP   No chief complaint on file.  Subjective    Karen Morrison is a 51 y.o. female who presents today for a complete physical exam.  She reports consuming a {diet types:17450} diet. {Exercise:19826} She generally feels {well/fairly well/poorly:18703}. She reports sleeping {well/fairly well/poorly:18703}. She {does/does not:200015} have additional problems to discuss today.  HPI  ***  Past Medical History:  Diagnosis Date   Allergic rhinitis    Anemia    during pregnancy only   Arthritis    joints   BMI 45.0-49.9, adult ([place]) 01/30/2017   Family history of adverse reaction to anesthesia    dad-n/v   Hypercholesteremia    Hypertension    controlled on medicine   Motion sickness    Obesity    PONV (postoperative nausea and vomiting)    scopalamine patch is the only thing that works for her   Pre-diabetes    last a1c on 04-13-21 was 5.7   Wears partial dentures    lower   Past Surgical History:  Procedure Laterality Date   [place]   COLONOSCOPY WITH PROPOFOL N/A 05/23/2019   Procedure: COLONOSCOPY WITH PROPOFOL;  Surgeon: Jonathon Bellows, MD;  Location: [place];  Service: Endoscopy;  Laterality: N/A;   CYSTOSCOPY  04/04/2017   Procedure: CYSTOSCOPY;  Surgeon: Gae Dry, MD;  Location: [facility];  Service: Gynecology;;   KNEE ARTHROPLASTY Right 07/26/2021   Procedure: COMPUTER ASSISTED TOTAL KNEE ARTHROPLASTY;  Surgeon: Dereck Leep, MD;  Location: [facility];  Service: Orthopedics;  Laterality: Right;   POLYPECTOMY  05/23/2019   Procedure: POLYPECTOMY;  Surgeon: Jonathon Bellows, MD;  Location: [place];  Service: Endoscopy;;   REPLACEMENT TOTAL KNEE Left 01/02/2013   TOTAL LAPAROSCOPIC HYSTERECTOMY WITH SALPINGECTOMY Bilateral 04/04/2017    Procedure: HYSTERECTOMY TOTAL LAPAROSCOPIC WITH SALPINGECTOMY;  Surgeon: Gae Dry, MD;  Location: [facility];  Service: Gynecology;  Laterality: Bilateral;   VAGINAL BIRTH AFTER CESAREAN SECTION     Social History   Socioeconomic History   Marital status: Married    Spouse name: Ronnie   Number of children: 2   Years of education: HS   Highest education level: Not on file  Occupational History   Not on file  Tobacco Use   Smoking status: Never   Smokeless tobacco: Never  Vaping Use   Vaping Use: Never used  Substance and Sexual Activity   Alcohol use: No    Alcohol/week: 0.0 standard drinks of alcohol   Drug use: No   Sexual activity: Yes    Birth control/protection: Pill, None  Other Topics Concern   Not on file  Social History Narrative   Not on file   Social Determinants of Health   Financial Resource Strain: Not on file  Food Insecurity: Not on file  Transportation Needs: Not on file  Physical Activity: Not on file  Stress: Not on file  Social Connections: Not on file  Intimate Partner Violence: Not on file   Family Status  Relation Name Status   Mother Brayton Caves Deceased at age 2   Pat 25  Alive   [place]   MGF  Deceased   PGF  Deceased   [place]  Deceased   Father  Alive  Sister  Alive   Daughter  72   Son  Alive   Sister  Alive   Family History  Problem Relation Age of Onset   Diabetes Mother        Type 2   Breast cancer Paternal Aunt        57s?   Alzheimer's disease Maternal Grandmother    Heart attack Maternal Grandfather    Emphysema Paternal Grandfather    Healthy Father    Healthy Sister    Healthy Daughter    Healthy Son    Healthy Sister    Allergies  Allergen Reactions   Hydrocodone-Acetaminophen Nausea And Vomiting   Amoxicillin Hives, Itching and Rash   Apple Cider Vinegar Rash   Azithromycin Hives, Itching and Rash   Doxycycline Rash   Penicillins Hives, Itching and Rash    IgE = 66 (WNL) on  07/15/2021  Has patient had a PCN reaction causing immediate rash, facial/tongue/throat swelling, SOB or lightheadedness with hypotension: Yes Has patient had a PCN reaction causing severe rash involving mucus membranes or skin necrosis: Yes Has patient had a PCN reaction that required hospitalization: No Has patient had a PCN reaction occurring within the last 10 years: No If all of the above answers are "NO", then may proceed with Cephalosporin use.     Patient Care Team: Gwyneth Sprout, FNP as PCP - General (Family Medicine)   Medications: Outpatient Medications Prior to Visit  Medication Sig   acetaminophen (TYLENOL) 500 MG tablet Take 500 mg by mouth every 6 (six) hours as needed.   celecoxib (CELEBREX) 200 MG capsule Take 1 capsule (200 mg total) by mouth 2 (two) times daily.   cholecalciferol (VITAMIN D3) 25 MCG (1000 UNIT) tablet Take 1,000 Units by mouth daily.   Cyanocobalamin (B-12 COMPLIANCE INJECTION) 1000 MCG/ML KIT Inject as directed.   hydrochlorothiazide (HYDRODIURIL) 25 MG tablet Take 1 tablet (25 mg total) by mouth every morning.   meloxicam (MOBIC) 7.5 MG tablet Take 7.5 mg by mouth daily.   metFORMIN (GLUCOPHAGE-XR) 750 MG 24 hr tablet TAKE 1 TABLET(750 MG) BY MOUTH DAILY WITH BREAKFAST   Multiple Vitamin (MULTIVITAMIN) tablet Take 1 tablet by mouth daily.   phentermine 37.5 MG capsule Take 1 capsule (37.5 mg total) by mouth every morning.   Psyllium (METAMUCIL FIBER) 51.7 % PACK Take 1 packet by mouth in the morning.   senna (SENOKOT) 8.6 MG tablet Take 1 tablet by mouth daily as needed for constipation.   No facility-administered medications prior to visit.    Review of Systems  {Labs  Heme  Chem  Endocrine  Serology  Results Review (optional):23779}  Objective    LMP 03/16/2017 (Exact Date)  {Show previous vital signs (optional):23777}   Physical Exam  ***  Last depression screening scores    10/14/2021    8:20 AM 04/13/2021    9:22 AM  04/03/2020    8:20 AM  PHQ 2/9 Scores  PHQ - 2 Score 0 0 0  PHQ- 9 Score '3 5 3   ' Last fall risk screening    10/14/2021    8:20 AM  [place] in the past year? 0  Number falls in past yr: 0  Injury with Fall? 0  Risk for fall due to : No Fall Risks  Follow up Falls evaluation completed   Last Audit-C alcohol use screening    10/14/2021    8:19 AM  Alcohol Use Disorder Test (AUDIT)  1. How  often do you have a drink containing alcohol? 0  2. How many drinks containing alcohol do you have on a typical day when you are drinking? 0  3. How often do you have six or more drinks on one occasion? 0  AUDIT-C Score 0   A score of 3 or more in women, and 4 or more in men indicates increased risk for alcohol abuse, EXCEPT if all of the points are from question 1   No results found for any visits on 04/15/22.  Assessment & Plan    Routine Health Maintenance and Physical Exam  Exercise Activities and Dietary recommendations  Goals   None     Immunization History  Administered Date(s) Administered   Influenza Inj Mdck Quad Pf 08/04/2017   Influenza Split 07/06/2012, 08/26/2013   Influenza,inj,Quad PF,6+ Mos 08/15/2018, 07/12/2019, 07/27/2021   Moderna SARS-COV2 Booster Vaccination 10/02/2020   PFIZER(Purple Top)SARS-COV-2 Vaccination 12/26/2019, 01/16/2020   Td 07/19/2005   Tdap 01/23/2015   Zoster Recombinat (Shingrix) 04/13/2021, 10/14/2021    Health Maintenance  Topic Date Due   COVID-19 Vaccine (3 - Pfizer series) 11/27/2020   INFLUENZA VACCINE  05/17/2022   MAMMOGRAM  12/04/2023   COLONOSCOPY (Pts 45-74yr Insurance coverage will need to be confirmed)  05/22/2024   TETANUS/TDAP  01/22/2025   Hepatitis C Screening  Completed   HIV Screening  Completed   Zoster Vaccines- Shingrix  Completed   HPV VACCINES  Aged Out    Discussed health benefits of physical activity, and encouraged her to engage in regular exercise appropriate for her age and  condition.  ***  No follow-ups on file.     {provider attestation***:1}   EGwyneth Sprout [phone](phone) [phone](fax)  [place]

## 2022-04-15 ENCOUNTER — Encounter: Payer: Managed Care, Other (non HMO) | Admitting: Family Medicine

## 2022-04-25 NOTE — Progress Notes (Unsigned)
Complete physical exam   Patient: Karen Morrison   DOB: [birth year]   51 y.o. Female  MRN: [id number] Visit Date: 04/27/2022  Today's healthcare provider: Gwyneth Sprout, FNP  Introduced to nurse practitioner role and practice setting.  All questions answered.  Discussed provider/patient relationship and expectations.   I,Jizel Cheeks J Darey Hershberger,acting as a scribe for Gwyneth Sprout, FNP.,have documented all relevant documentation on the behalf of Gwyneth Sprout, FNP,as directed by  Gwyneth Sprout, FNP while in the presence of Gwyneth Sprout, FNP.   Chief Complaint  Patient presents with   Annual Exam   Weight Loss    Patient wants to continue discussion on weight loss options.    Subjective    Karen Morrison is a 51 y.o. female who presents today for a complete physical exam.  She reports consuming a  low sugar, rationed  diet. Home exercise routine includes bike, walking 30-60 minutes/day. She generally feels well. She reports sleeping poorly. She does have additional problems to discuss today: possible weight loss options HPI HPI     Weight Loss    Additional comments: Patient wants to continue discussion on weight loss options.       Last edited by Smitty Knudsen, CMA on 04/27/2022  8:09 AM.        Past Medical History:  Diagnosis Date   Allergic rhinitis    Anemia    during pregnancy only   Arthritis    joints   BMI 45.0-49.9, adult ([place]) 01/30/2017   Family history of adverse reaction to anesthesia    dad-n/v   Hypercholesteremia    Hypertension    controlled on medicine   Motion sickness    Obesity    PONV (postoperative nausea and vomiting)    scopalamine patch is the only thing that works for her   Pre-diabetes    last a1c on 04-13-21 was 5.7   Wears partial dentures    lower   Past Surgical History:  Procedure Laterality Date   [place]   COLONOSCOPY WITH PROPOFOL N/A 05/23/2019   Procedure: COLONOSCOPY WITH  PROPOFOL;  Surgeon: Jonathon Bellows, MD;  Location: [facility];  Service: Endoscopy;  Laterality: N/A;   CYSTOSCOPY  04/04/2017   Procedure: CYSTOSCOPY;  Surgeon: Gae Dry, MD;  Location: [facility];  Service: Gynecology;;   KNEE ARTHROPLASTY Right 07/26/2021   Procedure: COMPUTER ASSISTED TOTAL KNEE ARTHROPLASTY;  Surgeon: Dereck Leep, MD;  Location: [facility];  Service: Orthopedics;  Laterality: Right;   POLYPECTOMY  05/23/2019   Procedure: POLYPECTOMY;  Surgeon: Jonathon Bellows, MD;  Location: [facility];  Service: Endoscopy;;   REPLACEMENT TOTAL KNEE Left 01/02/2013   TOTAL LAPAROSCOPIC HYSTERECTOMY WITH SALPINGECTOMY Bilateral 04/04/2017   Procedure: HYSTERECTOMY TOTAL LAPAROSCOPIC WITH SALPINGECTOMY;  Surgeon: Gae Dry, MD;  Location: [facility];  Service: Gynecology;  Laterality: Bilateral;   VAGINAL BIRTH AFTER CESAREAN SECTION     Social History   Socioeconomic History   Marital status: Married    Spouse name: Ronnie   Number of children: 2   Years of education: HS   Highest education level: Not on file  Occupational History   Not on file  Tobacco Use   Smoking status: Never   Smokeless tobacco: Never  Vaping Use   Vaping Use: Never used  Substance and Sexual Activity   Alcohol use: No    Alcohol/week: 0.0 standard  drinks of alcohol   Drug use: No   Sexual activity: Yes    Birth control/protection: Pill, None  Other Topics Concern   Not on file  Social History Narrative   Not on file   Social Determinants of Health   Financial Resource Strain: Not on file  Food Insecurity: Not on file  Transportation Needs: Not on file  Physical Activity: Not on file  Stress: Not on file  Social Connections: Not on file  Intimate Partner Violence: Not on file   Family Status  Relation Name Status   Mother Brayton Caves Deceased at age 60   Pat 5  [place]   MGF  Deceased   PGF  Deceased   [place]  Deceased   Father  Alive   Sister   Alive   Daughter  59   Son  Alive   Sister  Alive   Family History  Problem Relation Age of Onset   Diabetes Mother        Type 2   Breast cancer Paternal Aunt        31s?   Alzheimer's disease Maternal Grandmother    Heart attack Maternal Grandfather    Emphysema Paternal Grandfather    Healthy Father    Healthy Sister    Healthy Daughter    Healthy Son    Healthy Sister    Allergies  Allergen Reactions   Hydrocodone-Acetaminophen Nausea And Vomiting   Amoxicillin Hives, Itching and Rash   Apple Cider Vinegar Rash   Azithromycin Hives, Itching and Rash   Doxycycline Rash   Penicillins Hives, Itching and Rash    IgE = 66 (WNL) on 07/15/2021  Has patient had a PCN reaction causing immediate rash, facial/tongue/throat swelling, SOB or lightheadedness with hypotension: Yes Has patient had a PCN reaction causing severe rash involving mucus membranes or skin necrosis: Yes Has patient had a PCN reaction that required hospitalization: No Has patient had a PCN reaction occurring within the last 10 years: No If all of the above answers are "NO", then may proceed with Cephalosporin use.     Patient Care Team: Gwyneth Sprout, FNP as PCP - General (Family Medicine)   Medications: Outpatient Medications Prior to Visit  Medication Sig   acetaminophen (TYLENOL) 500 MG tablet Take 500 mg by mouth every 6 (six) hours as needed.   cholecalciferol (VITAMIN D3) 25 MCG (1000 UNIT) tablet Take 1,000 Units by mouth daily.   Multiple Vitamin (MULTIVITAMIN) tablet Take 1 tablet by mouth daily.   phentermine 37.5 MG capsule Take 1 capsule (37.5 mg total) by mouth every morning.   Psyllium (METAMUCIL FIBER) 51.7 % PACK Take 1 packet by mouth in the morning.   senna (SENOKOT) 8.6 MG tablet Take 1 tablet by mouth daily as needed for constipation.   [DISCONTINUED] celecoxib (CELEBREX) 200 MG capsule Take 1 capsule (200 mg total) by mouth 2 (two) times daily.   [DISCONTINUED]  hydrochlorothiazide (HYDRODIURIL) 25 MG tablet Take 1 tablet (25 mg total) by mouth every morning.   [DISCONTINUED] meloxicam (MOBIC) 7.5 MG tablet Take 7.5 mg by mouth daily.   [DISCONTINUED] metFORMIN (GLUCOPHAGE-XR) 750 MG 24 hr tablet TAKE 1 TABLET(750 MG) BY MOUTH DAILY WITH BREAKFAST   Cyanocobalamin (B-12 COMPLIANCE INJECTION) 1000 MCG/ML KIT Inject as directed. (Patient not taking: Reported on 04/27/2022)   No facility-administered medications prior to visit.    Review of Systems    Objective     BP 130/86 (BP  Location: Right Arm, Patient Position: Sitting, Cuff Size: Large)   Pulse (!) 110   Temp 98.1 F (36.7 C) (Oral)   Resp 16   Ht '5\' 5"'  (1.651 m)   Wt 247 lb 11.2 oz (112.4 kg)   LMP 03/16/2017 (Exact Date)   SpO2 98%   BMI 41.22 kg/m     Physical Exam Vitals and nursing note reviewed.  Constitutional:      General: She is awake. She is not in acute distress.    Appearance: Normal appearance. She is well-developed and well-groomed. She is obese. She is not ill-appearing, toxic-appearing or diaphoretic.  HENT:     Head: Normocephalic and atraumatic.     Jaw: There is normal jaw occlusion. No trismus, tenderness, swelling or pain on movement.     Right Ear: Hearing, ear canal and external ear normal. There is no impacted cerumen.     Left Ear: Hearing, ear canal and external ear normal. There is no impacted cerumen.     Nose: Nose normal. No congestion or rhinorrhea.     Right Turbinates: Not enlarged, swollen or pale.     Left Turbinates: Not enlarged, swollen or pale.     Right Sinus: No maxillary sinus tenderness or frontal sinus tenderness.     Left Sinus: No maxillary sinus tenderness or frontal sinus tenderness.     Mouth/Throat:     Lips: Pink.     Mouth: Mucous membranes are moist. No injury.     Tongue: No lesions.     Pharynx: Oropharynx is clear. Uvula midline. No pharyngeal swelling, oropharyngeal exudate, posterior oropharyngeal erythema or uvula  swelling.     Tonsils: No tonsillar exudate or tonsillar abscesses.  Eyes:     General: Lids are normal. Lids are everted, no foreign bodies appreciated. Vision grossly intact. Gaze aligned appropriately. No allergic shiner or visual field deficit.       Right eye: No discharge.        Left eye: No discharge.     Extraocular Movements: Extraocular movements intact.     Conjunctiva/sclera: Conjunctivae normal.     Right eye: Right conjunctiva is not injected. No exudate.    Left eye: Left conjunctiva is not injected. No exudate.    Pupils: Pupils are equal, round, and reactive to light.  Neck:     Thyroid: No thyroid mass, thyromegaly or thyroid tenderness.     Vascular: No carotid bruit.     Trachea: Trachea normal.  Cardiovascular:     Rate and Rhythm: Regular rhythm. Tachycardia present.     Pulses: Normal pulses.          Carotid pulses are 2+ on the right side and 2+ on the left side.      Radial pulses are 2+ on the right side and 2+ on the left side.       Dorsalis pedis pulses are 2+ on the right side and 2+ on the left side.       Posterior tibial pulses are 2+ on the right side and 2+ on the left side.     Heart sounds: Normal heart sounds, S1 normal and S2 normal. No murmur heard.    No friction rub. No gallop.  Pulmonary:     Effort: Pulmonary effort is normal. No respiratory distress.     Breath sounds: Normal breath sounds and air entry. No stridor. No wheezing, rhonchi or rales.  Chest:     Chest wall: No tenderness.  Comments: Breasts: risk and benefit of breast self-exam was discussed, not examined, patient declines to have breast exam  Abdominal:     General: Abdomen is flat. Bowel sounds are normal.     Palpations: Abdomen is soft.  Genitourinary:    Comments: Exam deferred; denies complaints Musculoskeletal:        General: No swelling, tenderness, deformity or signs of injury. Normal range of motion.     Cervical back: Full passive range of motion without  pain, normal range of motion and neck supple. No edema, rigidity or tenderness. No muscular tenderness.     Right lower leg: No edema.     Left lower leg: No edema.  Lymphadenopathy:     Cervical: No cervical adenopathy.     Right cervical: No superficial, deep or posterior cervical adenopathy.    Left cervical: No superficial, deep or posterior cervical adenopathy.  Skin:    General: Skin is warm and dry.     Capillary Refill: Capillary refill takes less than 2 seconds.     Coloration: Skin is not jaundiced or pale.     Findings: No bruising, erythema, lesion or rash.  Neurological:     General: No focal deficit present.     Mental Status: She is alert and oriented to person, place, and time. Mental status is at baseline.     GCS: GCS eye subscore is 4. GCS verbal subscore is 5. GCS motor subscore is 6.     Cranial Nerves: No cranial nerve deficit.     Sensory: Sensation is intact. No sensory deficit.     Motor: Motor function is intact. No weakness.     Coordination: Coordination is intact. Coordination normal.     Gait: Gait is intact. Gait normal.  Psychiatric:        Attention and Perception: Attention and perception normal.        Mood and Affect: Affect normal. Mood is anxious.        Speech: Speech normal.        Behavior: Behavior normal. Behavior is cooperative.        Thought Content: Thought content normal.        Cognition and Memory: Cognition and memory normal.        Judgment: Judgment normal.      Last depression screening scores    04/27/2022    8:14 AM 10/14/2021    8:20 AM 04/13/2021    9:22 AM  PHQ 2/9 Scores  PHQ - 2 Score 0 0 0  PHQ- 9 Score '2 3 5   ' Last fall risk screening    04/27/2022    8:14 AM  [place] in the past year? 0  Number falls in past yr: 0  Injury with Fall? 0   Last Audit-C alcohol use screening    04/27/2022    8:15 AM  Alcohol Use Disorder Test (AUDIT)  1. How often do you have a drink containing alcohol? 2  2.  How many drinks containing alcohol do you have on a typical day when you are drinking? 0  3. How often do you have six or more drinks on one occasion? 0  AUDIT-C Score 2   A score of 3 or more in women, and 4 or more in men indicates increased risk for alcohol abuse, EXCEPT if all of the points are from question 1   No results found for any visits on 04/27/22.  Assessment & Plan  Routine Health Maintenance and Physical Exam  Exercise Activities and Dietary recommendations  Goals   None     Immunization History  Administered Date(s) Administered   Influenza Inj Mdck Quad Pf 08/04/2017   Influenza Split 07/06/2012, 08/26/2013   Influenza,inj,Quad PF,6+ Mos 08/15/2018, 07/12/2019, 07/27/2021   Moderna SARS-COV2 Booster Vaccination 10/02/2020   PFIZER(Purple Top)SARS-COV-2 Vaccination 12/26/2019, 01/16/2020   Td 07/19/2005   Tdap 01/23/2015   Zoster Recombinat (Shingrix) 04/13/2021, 10/14/2021    Health Maintenance  Topic Date Due   COVID-19 Vaccine (3 - Pfizer series) 11/27/2020   INFLUENZA VACCINE  05/17/2022   MAMMOGRAM  12/04/2023   COLONOSCOPY (Pts 45-32yr Insurance coverage will need to be confirmed)  05/22/2024   TETANUS/TDAP  01/22/2025   Hepatitis C Screening  Completed   HIV Screening  Completed   Zoster Vaccines- Shingrix  Completed   HPV VACCINES  Aged Out    Discussed health benefits of physical activity, and encouraged her to engage in regular exercise appropriate for her age and condition.  Problem List Items Addressed This Visit       Cardiovascular and Mediastinum   Essential hypertension    Chronic, stable Denies CP Denies SOB/ DOE Denies low blood pressure/hypotension Denies vision changes No LE Edema noted on exam Continue medication, HCTZ 25 mg Denies side effects Discussed water intake minimum of 64 oz/day with max of 120 oz/day Seek emergent care if you develop chest pain or chest pressure       Relevant Medications    hydrochlorothiazide (HYDRODIURIL) 25 MG tablet     Other   Adjustment insomnia    Patient reports acute family stressors related to extended family, where her dad's new wife is keeping him from her and her family Has been up stressing at night, maybe only sleeping 3-4 hours/night Could also be a side effect from phentermine Recommend trial of trazodone        Relevant Medications   traZODone (DESYREL) 50 MG tablet   Annual physical exam - Primary    Continue to recommend annual eye and dental visision PAP UTD Mammogram due  Things to do to keep yourself healthy  - Exercise at least 30-45 minutes a day, 3-4 days a week.  - Eat a low-fat diet with lots of fruits and vegetables, up to 7-9 servings per day.  - Seatbelts can save your life. Wear them always.  - Smoke detectors on every level of your home, check batteries every year.  - Eye Doctor - have an eye exam every 1-2 years  - Safe sex - if you may be exposed to STDs, use a condom.  - Alcohol -  If you drink, do it moderately, less than 2 drinks per day.  - [place] Choose someone to speak for you if you are not able.  - Depression is common in our stressful world.If you're feeling down or losing interest in things you normally enjoy, please come in for a visit.  - Violence - If anyone is threatening or hurting you, please call immediately.        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 Discussed importance of healthy weight management Discussed diet and exercise       Relevant Medications   metFORMIN (GLUCOPHAGE-XR) 750 MG 24 hr tablet   Other Relevant Orders   Comprehensive metabolic panel   Lipid panel   Hemoglobin A1c   Pre-diabetes    Chronic, previous stably with use of Metformin XR 750 mg Continue to recommend balanced, lower carb meals. Smaller meal size, adding snacks. Choosing water as drink of choice and increasing purposeful exercise. Patient has been able to successfully lose weight with use of [place] Body mass index is 41.22 kg/m. 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     Vonna Kotyk, FNP, have reviewed all documentation for this visit. The documentation on 04/27/22 for the exam, diagnosis, procedures, and orders are all accurate and complete.    Gwyneth Sprout, [place] [phone] (phone) [phone] (fax)  [place]

## 2022-04-27 ENCOUNTER — Encounter: Payer: Self-pay | Admitting: Family Medicine

## 2022-04-27 ENCOUNTER — Ambulatory Visit (INDEPENDENT_AMBULATORY_CARE_PROVIDER_SITE_OTHER): Payer: Managed Care, Other (non HMO) | Admitting: Family Medicine

## 2022-04-27 VITALS — BP 130/86 | HR 110 | Temp 98.1°F | Resp 16 | Ht 65.0 in | Wt 247.7 lb

## 2022-04-27 DIAGNOSIS — Z0000 Encounter for general adult medical examination without abnormal findings: Secondary | ICD-10-CM | POA: Insufficient documentation

## 2022-04-27 DIAGNOSIS — E538 Deficiency of other specified B group vitamins: Secondary | ICD-10-CM

## 2022-04-27 DIAGNOSIS — E531 Pyridoxine deficiency: Secondary | ICD-10-CM

## 2022-04-27 DIAGNOSIS — I10 Essential (primary) hypertension: Secondary | ICD-10-CM | POA: Diagnosis not present

## 2022-04-27 DIAGNOSIS — R7303 Prediabetes: Secondary | ICD-10-CM | POA: Diagnosis not present

## 2022-04-27 DIAGNOSIS — E876 Hypokalemia: Secondary | ICD-10-CM

## 2022-04-27 DIAGNOSIS — E7800 Pure hypercholesterolemia, unspecified: Secondary | ICD-10-CM

## 2022-04-27 DIAGNOSIS — F5102 Adjustment insomnia: Secondary | ICD-10-CM | POA: Insufficient documentation

## 2022-04-27 DIAGNOSIS — E559 Vitamin D deficiency, unspecified: Secondary | ICD-10-CM

## 2022-04-27 MED ORDER — METFORMIN HCL ER 750 MG PO TB24: 750.0000 mg | ORAL_TABLET | Freq: Every day | ORAL | 3 refills | Status: DC

## 2022-04-27 MED ORDER — MELOXICAM 7.5 MG PO TABS: 7.5000 mg | ORAL_TABLET | Freq: Every day | ORAL | 3 refills | Status: DC

## 2022-04-27 MED ORDER — TRAZODONE HCL 50 MG PO TABS
25.0000 mg | ORAL_TABLET | Freq: Every evening | ORAL | 3 refills | Status: DC | PRN
Start: 2022-04-27 — End: 2023-04-25

## 2022-04-27 MED ORDER — HYDROCHLOROTHIAZIDE 25 MG PO TABS: 25.0000 mg | ORAL_TABLET | ORAL | 3 refills | Status: DC

## 2022-04-27 NOTE — Assessment & Plan Note (Signed)
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

## 2022-04-27 NOTE — Assessment & Plan Note (Signed)
Chronic, previous stably with use of Metformin XR 750 mg Continue to recommend balanced, lower carb meals. Smaller meal size, adding snacks. Choosing water as drink of choice and increasing purposeful exercise. Patient has been able to successfully lose weight with use of [place] Body mass index is 41.22 kg/m. Repeat A1c today

## 2022-04-27 NOTE — Assessment & Plan Note (Signed)
Chronic, improved Body mass index is 41.22 kg/m. Discussed importance of healthy weight management Discussed diet and exercise

## 2022-04-27 NOTE — Assessment & Plan Note (Signed)
Chronic, previous elevated Associated with HTN and pre-DM Repeat FLP today

## 2022-04-27 NOTE — Assessment & Plan Note (Signed)
Chronic, stable Denies CP Denies SOB/ DOE Denies low blood pressure/hypotension Denies vision changes No LE Edema noted on exam Continue medication, HCTZ 25 mg Denies side effects Discussed water intake minimum of 64 oz/day with max of 120 oz/day Seek emergent care if you develop chest pain or chest pressure

## 2022-04-27 NOTE — Assessment & Plan Note (Signed)
Continue to recommend annual eye and dental visision PAP UTD Mammogram due  Things to do to keep yourself healthy  - Exercise at least 30-45 minutes a day, 3-4 days a week.  - Eat a low-fat diet with lots of fruits and vegetables, up to 7-9 servings per day.  - Seatbelts can save your life. Wear them always.  - Smoke detectors on every level of your home, check batteries every year.  - Eye Doctor - have an eye exam every 1-2 years  - Safe sex - if you may be exposed to STDs, use a condom.  - Alcohol -  If you drink, do it moderately, less than 2 drinks per day.  - [place]. Choose someone to speak for you if you are not able.  - Depression is common in our stressful world.If you're feeling down or losing interest in things you normally enjoy, please come in for a visit.  - Violence - If anyone is threatening or hurting you, please call immediately.

## 2022-04-27 NOTE — Assessment & Plan Note (Signed)
Patient reports acute family stressors related to extended family, where her dad's new wife is keeping him from her and her family Has been up stressing at night, maybe only sleeping 3-4 hours/night Could also be a side effect from phentermine Recommend trial of trazodone

## 2022-04-28 NOTE — Progress Notes (Signed)
Blood chemistry remains stable.  Cholesterol is improved; however, remains elevated above goal. Both bad/LDL and total cholesterol are elevated. I recommend diet low in saturated fat and regular exercise - 30 min at least 5 times per week. Risk of heart attack/stroke is low at 2% in 10 years. The 10-year ASCVD risk score (Arnett DK, et al., 2019) is: 1.6%   Values used to calculate the score:     Age: 51 years     Sex: Female     Is Non-Hispanic African American: No     Diabetic: No     Tobacco smoker: No     Systolic Blood Pressure: 449 mmHg     Is BP treated: Yes     HDL Cholesterol: 78 mg/dL     Total Cholesterol: 219 mg/dL  B-12 is now supra-therapeutic; can scale back on supplementation.  Vit D remains stable in low 40s.  A1c and B6 are pending.  Gwyneth Sprout, [address] [place], [place] [address] [phone] (phone) [phone] (fax) [place]

## 2022-05-03 LAB — VITAMIN B6: Vitamin B6: 33.6 ug/L (ref 3.4–65.2)

## 2022-05-03 LAB — COMPREHENSIVE METABOLIC PANEL
ALT: 22 IU/L (ref 0–32)
AST: 24 IU/L (ref 0–40)
Albumin/Globulin Ratio: 2.5 — ABNORMAL HIGH (ref 1.2–2.2)
Albumin: 4.7 g/dL (ref 3.8–4.9)
Alkaline Phosphatase: 100 IU/L (ref 44–121)
BUN/Creatinine Ratio: 10 (ref 9–23)
BUN: 8 mg/dL (ref 6–24)
Bilirubin Total: 0.5 mg/dL (ref 0.0–1.2)
CO2: 24 mmol/L (ref 20–29)
Calcium: 9.9 mg/dL (ref 8.7–10.2)
Chloride: 94 mmol/L — ABNORMAL LOW (ref 96–106)
Creatinine, Ser: 0.79 mg/dL (ref 0.57–1.00)
Globulin, Total: 1.9 g/dL (ref 1.5–4.5)
Glucose: 85 mg/dL (ref 70–99)
Potassium: 3.6 mmol/L (ref 3.5–5.2)
Sodium: 139 mmol/L (ref 134–144)
Total Protein: 6.6 g/dL (ref 6.0–8.5)
eGFR: 90 mL/min/{1.73_m2} (ref >59)

## 2022-05-03 LAB — B12 AND FOLATE PANEL
Folate: >20 ng/mL (ref >3.0)
Vitamin B-12: 1590 pg/mL — ABNORMAL HIGH (ref 232–1245)

## 2022-05-03 LAB — LIPID PANEL
Chol/HDL Ratio: 2.8 ratio (ref 0.0–4.4)
Cholesterol, Total: 219 mg/dL — ABNORMAL HIGH (ref 100–199)
HDL: 78 mg/dL (ref >39)
LDL Chol Calc (NIH): 126 mg/dL — ABNORMAL HIGH (ref 0–99)
Triglycerides: 87 mg/dL (ref 0–149)
VLDL Cholesterol Cal: 15 mg/dL (ref 5–40)

## 2022-05-03 LAB — VITAMIN D 25 HYDROXY (VIT D DEFICIENCY, FRACTURES): Vit D, 25-Hydroxy: 43.1 ng/mL (ref 30.0–100.0)

## 2022-05-03 LAB — HEMOGLOBIN A1C
Est. average glucose Bld gHb Est-mCnc: 108 mg/dL
Hgb A1c MFr Bld: 5.4 % (ref 4.8–5.6)

## 2022-05-03 NOTE — Progress Notes (Signed)
B6 is stabilized. A1c is also stable, at 5.4%- not in pre-diabetic range. Gwyneth Sprout, [address] [place], [place] [address] [phone] (phone) [phone] (fax) [place]

## 2022-07-05 ENCOUNTER — Other Ambulatory Visit: Payer: Self-pay | Admitting: Family Medicine

## 2022-07-05 DIAGNOSIS — R7303 Prediabetes: Secondary | ICD-10-CM

## 2022-07-05 DIAGNOSIS — I10 Essential (primary) hypertension: Secondary | ICD-10-CM

## 2022-11-02 ENCOUNTER — Other Ambulatory Visit: Payer: Self-pay | Admitting: Family Medicine

## 2022-11-02 DIAGNOSIS — Z1231 Encounter for screening mammogram for malignant neoplasm of breast: Secondary | ICD-10-CM

## 2022-12-05 ENCOUNTER — Ambulatory Visit
Admission: RE | Admit: 2022-12-05 | Discharge: 2022-12-05 | Disposition: A | Discharge status: Home / Self Care | Payer: Managed Care, Other (non HMO) | Source: Ambulatory Visit | Attending: Family Medicine | Admitting: Family Medicine

## 2022-12-05 DIAGNOSIS — Z1231 Encounter for screening mammogram for malignant neoplasm of breast: Secondary | ICD-10-CM | POA: Diagnosis not present

## 2023-04-24 ENCOUNTER — Other Ambulatory Visit: Payer: Self-pay | Admitting: Family Medicine

## 2023-04-24 DIAGNOSIS — F5102 Adjustment insomnia: Secondary | ICD-10-CM

## 2023-05-02 ENCOUNTER — Ambulatory Visit (INDEPENDENT_AMBULATORY_CARE_PROVIDER_SITE_OTHER): Payer: Managed Care, Other (non HMO) | Admitting: Family Medicine

## 2023-05-02 ENCOUNTER — Encounter: Payer: Self-pay | Admitting: Family Medicine

## 2023-05-02 VITALS — BP 123/69 | HR 111 | Ht 65.0 in | Wt 283.5 lb

## 2023-05-02 DIAGNOSIS — Z0000 Encounter for general adult medical examination without abnormal findings: Secondary | ICD-10-CM

## 2023-05-02 DIAGNOSIS — R7303 Prediabetes: Secondary | ICD-10-CM

## 2023-05-02 DIAGNOSIS — L02214 Cutaneous abscess of groin: Secondary | ICD-10-CM

## 2023-05-02 DIAGNOSIS — I10 Essential (primary) hypertension: Secondary | ICD-10-CM

## 2023-05-02 DIAGNOSIS — E7800 Pure hypercholesterolemia, unspecified: Secondary | ICD-10-CM

## 2023-05-02 MED ORDER — METFORMIN HCL ER 750 MG PO TB24: 750.0000 mg | ORAL_TABLET | Freq: Every day | ORAL | 1 refills | Status: DC

## 2023-05-02 MED ORDER — MELOXICAM 7.5 MG PO TABS: 7.5000 mg | ORAL_TABLET | Freq: Every day | ORAL | 0 refills | Status: DC

## 2023-05-02 MED ORDER — HYDROCHLOROTHIAZIDE 25 MG PO TABS: 25.0000 mg | ORAL_TABLET | Freq: Every day | ORAL | 1 refills | Status: DC

## 2023-05-02 MED ORDER — SULFAMETHOXAZOLE-TRIMETHOPRIM 800-160 MG PO TABS: 1.0000 | ORAL_TABLET | Freq: Two times a day (BID) | ORAL | 0 refills | Status: DC

## 2023-05-02 NOTE — Assessment & Plan Note (Signed)
Chronic; well controlled  At goal of <139/<89 Continues of hydrochlorothiazide at 25 mg

## 2023-05-02 NOTE — Progress Notes (Signed)
Complete physical exam  Patient: Karen Morrison   DOB: 10-04-1971   52 y.o. Female  MRN: [id number] Visit Date: 05/02/2023  Today's healthcare provider: Jacky Kindle, FNP  Introduced to nurse practitioner role and practice setting.  All questions answered.  Discussed provider/patient relationship and expectations.  Chief Complaint  Patient presents with   Annual Exam    Patient reports consuming general diet and exercising for thirty minutes three times a week. She reports feeling well and sleeping poorly. She reports that she has concerns of pimples come up on her right hip socket and she popped it but now it has a rash in that same area. She has been using neosporin for treatment and placing a bandage but it wouldn't stick due to being in the bend of her leg. She would like it looked at.  ROS: wound    Subjective    Karen Morrison is a 52 y.o. female who presents today for a complete physical exam.  She reports consuming a general diet. The patient does not participate in regular exercise at present. She generally feels fairly well. She reports sleeping poorly. She does have additional problems to discuss today.   HPI HPI     Annual Exam    Additional comments: Patient reports consuming general diet and exercising for thirty minutes three times a week. She reports feeling well and sleeping poorly. She reports that she has concerns of pimples come up on her right hip socket and she popped it but now it has a rash in that same area. She has been using neosporin for treatment and placing a bandage but it wouldn't stick due to being in the bend of her leg. She would like it looked at.  ROS: wound       Last edited by Acey Lav, CMA on 05/02/2023  9:23 AM.      Past Medical History:  Diagnosis Date   Allergic rhinitis    Anemia    during pregnancy only   Arthritis    joints   BMI 45.0-49.9, adult (HCC) 01/30/2017   Family history of adverse reaction to anesthesia     dad-n/v   Hypercholesteremia    Hypertension    controlled on medicine   Motion sickness    Obesity    PONV (postoperative nausea and vomiting)    scopalamine patch is the only thing that works for her   Pre-diabetes    last a1c on 04-13-21 was 5.7   Wears partial dentures    lower   Past Surgical History:  Procedure Laterality Date   CESAREAN SECTION  1994   CHOLECYSTECTOMY  1999   COLONOSCOPY WITH PROPOFOL N/A 05/23/2019   Procedure: COLONOSCOPY WITH PROPOFOL;  Surgeon: Wyline Mood, MD;  Location: [facility];  Service: Endoscopy;  Laterality: N/A;   CYSTOSCOPY  04/04/2017   Procedure: CYSTOSCOPY;  Surgeon: Nadara Mustard, MD;  Location: [facility];  Service: Gynecology;;   KNEE ARTHROPLASTY Right 07/26/2021   Procedure: COMPUTER ASSISTED TOTAL KNEE ARTHROPLASTY;  Surgeon: Donato Heinz, MD;  Location: [facility];  Service: Orthopedics;  Laterality: Right;   POLYPECTOMY  05/23/2019   Procedure: POLYPECTOMY;  Surgeon: Wyline Mood, MD;  Location: [facility];  Service: Endoscopy;;   REPLACEMENT TOTAL KNEE Left 01/02/2013   TOTAL LAPAROSCOPIC HYSTERECTOMY WITH SALPINGECTOMY Bilateral 04/04/2017   Procedure: HYSTERECTOMY TOTAL LAPAROSCOPIC WITH SALPINGECTOMY;  Surgeon: Nadara Mustard, MD;  Location: [facility];  Service: Gynecology;  Laterality: Bilateral;  VAGINAL BIRTH AFTER CESAREAN SECTION     Social History   Socioeconomic History   Marital status: Married    Spouse name: Ronnie   Number of children: 2   Years of education: HS   Highest education level: Not on file  Occupational History   Not on file  Tobacco Use   Smoking status: Never   Smokeless tobacco: Never  Vaping Use   Vaping status: Never Used  Substance and Sexual Activity   Alcohol use: No    Alcohol/week: 0.0 standard drinks of alcohol   Drug use: No   Sexual activity: Yes    Birth control/protection: Pill, None  Other Topics Concern   Not on file  Social History Narrative   Not on  file   Social Determinants of Health   Financial Resource Strain: Not on file  Food Insecurity: Not on file  Transportation Needs: Not on file  Physical Activity: Not on file  Stress: Not on file  Social Connections: Not on file  Intimate Partner Violence: Not on file   Family Status  Relation Name Status   Mother Job Founds Deceased at age 64   Pat Aunt  Alive   MGM  Alive   MGF  Deceased   PGF  Deceased   PGM  Deceased   Father  Alive   Sister  Alive   Daughter  Alive   Son  Alive   Sister  Alive  No partnership data on file   Family History  Problem Relation Age of Onset   Diabetes Mother        Type 2   Breast cancer Paternal Aunt        78s?   Alzheimer's disease Maternal Grandmother    Heart attack Maternal Grandfather    Emphysema Paternal Grandfather    Healthy Father    Healthy Sister    Healthy Daughter    Healthy Son    Healthy Sister    Allergies  Allergen Reactions   Hydrocodone-Acetaminophen Nausea And Vomiting   Amoxicillin Hives, Itching and Rash   Apple Cider Vinegar Rash   Azithromycin Hives, Itching and Rash   Doxycycline Rash   Penicillins Hives, Itching and Rash    IgE = 66 (WNL) on 07/15/2021  Has patient had a PCN reaction causing immediate rash, facial/tongue/throat swelling, SOB or lightheadedness with hypotension: Yes Has patient had a PCN reaction causing severe rash involving mucus membranes or skin necrosis: Yes Has patient had a PCN reaction that required hospitalization: No Has patient had a PCN reaction occurring within the last 10 years: No If all of the above answers are "NO", then may proceed with Cephalosporin use.     Patient Care Team: Jacky Kindle, FNP as PCP - General (Family Medicine)   Medications: Outpatient Medications Prior to Visit  Medication Sig   acetaminophen (TYLENOL) 500 MG tablet Take 500 mg by mouth every 6 (six) hours as needed.   Multiple Vitamin (MULTIVITAMIN) tablet Take 1 tablet by  mouth daily.   [DISCONTINUED] hydrochlorothiazide (HYDRODIURIL) 25 MG tablet TAKE 1 TABLET(25 MG) BY MOUTH EVERY MORNING   [DISCONTINUED] meloxicam (MOBIC) 7.5 MG tablet TAKE 1 TABLET(7.5 MG) BY MOUTH DAILY   [DISCONTINUED] metFORMIN (GLUCOPHAGE-XR) 750 MG 24 hr tablet TAKE 1 TABLET(750 MG) BY MOUTH DAILY WITH BREAKFAST   cholecalciferol (VITAMIN D3) 25 MCG (1000 UNIT) tablet Take 1,000 Units by mouth daily. (Patient not taking: Reported on 05/02/2023)   Cyanocobalamin (B-12 COMPLIANCE INJECTION) 1000 MCG/ML KIT  Inject as directed. (Patient not taking: Reported on 04/27/2022)   phentermine 37.5 MG capsule Take 1 capsule (37.5 mg total) by mouth every morning. (Patient not taking: Reported on 05/02/2023)   Psyllium (METAMUCIL FIBER) 51.7 % PACK Take 1 packet by mouth in the morning. (Patient not taking: Reported on 05/02/2023)   senna (SENOKOT) 8.6 MG tablet Take 1 tablet by mouth daily as needed for constipation. (Patient not taking: Reported on 05/02/2023)   traZODone (DESYREL) 50 MG tablet TAKE 1/2 TO 1 TABLET(25 TO 50 MG) BY MOUTH AT BEDTIME AS NEEDED FOR SLEEP (Patient not taking: Reported on 05/02/2023)   No facility-administered medications prior to visit.    Review of Systems  Last CBC Lab Results  Component Value Date   WBC 7.6 07/15/2021   HGB 15.2 (H) 07/15/2021   HCT 46.4 (H) 07/15/2021   MCV 86.2 07/15/2021   MCH 28.3 07/15/2021   RDW 14.4 07/15/2021   PLT 293 07/15/2021   Last metabolic panel Lab Results  Component Value Date   GLUCOSE 85 04/27/2022   NA 139 04/27/2022   K 3.6 04/27/2022   CL 94 (L) 04/27/2022   CO2 24 04/27/2022   BUN 8 04/27/2022   CREATININE 0.79 04/27/2022   EGFR 90 04/27/2022   CALCIUM 9.9 04/27/2022   PROT 6.6 04/27/2022   ALBUMIN 4.7 04/27/2022   LABGLOB 1.9 04/27/2022   AGRATIO 2.5 (H) 04/27/2022   BILITOT 0.5 04/27/2022   ALKPHOS 100 04/27/2022   AST 24 04/27/2022   ALT 22 04/27/2022   ANIONGAP 8 07/15/2021   Last lipids Lab Results   Component Value Date   CHOL 219 (H) 04/27/2022   HDL 78 04/27/2022   LDLCALC 126 (H) 04/27/2022   TRIG 87 04/27/2022   CHOLHDL 2.8 04/27/2022   Last hemoglobin A1c Lab Results  Component Value Date   HGBA1C 5.4 04/27/2022   Last thyroid functions Lab Results  Component Value Date   TSH 3.090 04/13/2021     Objective    BP 123/69 (BP Location: Left Arm, Patient Position: Sitting, Cuff Size: Large)   Pulse (!) 111   Ht 5\' 5"  (1.651 m)   Wt 283 lb 8 oz (128.6 kg)   LMP 03/16/2017 (Exact Date)   SpO2 99%   BMI 47.18 kg/m   BP Readings from Last 3 Encounters:  05/02/23 123/69  04/27/22 130/86  01/11/22 124/86   Wt Readings from Last 3 Encounters:  05/02/23 283 lb 8 oz (128.6 kg)  04/27/22 247 lb 11.2 oz (112.4 kg)  01/11/22 267 lb 3.2 oz (121.2 kg)     Physical Exam Vitals and nursing note reviewed.  Constitutional:      General: She is awake. She is not in acute distress.    Appearance: Normal appearance. She is well-developed and well-groomed. She is obese. She is not ill-appearing, toxic-appearing or diaphoretic.  HENT:     Head: Normocephalic and atraumatic.     Jaw: There is normal jaw occlusion. No trismus, tenderness, swelling or pain on movement.     Right Ear: Hearing, tympanic membrane, ear canal and external ear normal. There is no impacted cerumen.     Left Ear: Hearing, tympanic membrane, ear canal and external ear normal. There is no impacted cerumen.     Nose: Nose normal. No congestion or rhinorrhea.     Right Turbinates: Not enlarged, swollen or pale.     Left Turbinates: Not enlarged, swollen or pale.     Right Sinus: No  maxillary sinus tenderness or frontal sinus tenderness.     Left Sinus: No maxillary sinus tenderness or frontal sinus tenderness.     Mouth/Throat:     Lips: Pink.     Mouth: Mucous membranes are moist. No injury.     Tongue: No lesions.     Pharynx: Oropharynx is clear. Uvula midline. No pharyngeal swelling, oropharyngeal  exudate, posterior oropharyngeal erythema or uvula swelling.     Tonsils: No tonsillar exudate or tonsillar abscesses.  Eyes:     General: Lids are normal. Lids are everted, no foreign bodies appreciated. Vision grossly intact. Gaze aligned appropriately. No allergic shiner or visual field deficit.       Right eye: No discharge.        Left eye: No discharge.     Extraocular Movements: Extraocular movements intact.     Conjunctiva/sclera: Conjunctivae normal.     Right eye: Right conjunctiva is not injected. No exudate.    Left eye: Left conjunctiva is not injected. No exudate.    Pupils: Pupils are equal, round, and reactive to light.  Neck:     Thyroid: No thyroid mass, thyromegaly or thyroid tenderness.     Vascular: No carotid bruit.     Trachea: Trachea normal.  Cardiovascular:     Rate and Rhythm: Regular rhythm. Tachycardia present.     Pulses: Normal pulses.          Carotid pulses are 2+ on the right side and 2+ on the left side.      Radial pulses are 2+ on the right side and 2+ on the left side.       Dorsalis pedis pulses are 2+ on the right side and 2+ on the left side.       Posterior tibial pulses are 2+ on the right side and 2+ on the left side.     Heart sounds: Normal heart sounds, S1 normal and S2 normal. No murmur heard.    No friction rub. No gallop.  Pulmonary:     Effort: Pulmonary effort is normal. No respiratory distress.     Breath sounds: Normal breath sounds and air entry. No stridor. No wheezing, rhonchi or rales.  Chest:     Chest wall: No tenderness.  Abdominal:     General: Abdomen is flat. Bowel sounds are normal. There is no distension.     Palpations: Abdomen is soft. There is no mass.     Tenderness: There is no abdominal tenderness. There is no right CVA tenderness, left CVA tenderness, guarding or rebound.     Hernia: No hernia is present.  Genitourinary:    Comments: Exam deferred; denies complaints Musculoskeletal:        General: No  swelling, tenderness, deformity or signs of injury. Normal range of motion.     Cervical back: Full passive range of motion without pain, normal range of motion and neck supple. No edema, rigidity or tenderness. No muscular tenderness.     Right lower leg: No edema.     Left lower leg: No edema.  Lymphadenopathy:     Cervical: No cervical adenopathy.     Right cervical: No superficial, deep or posterior cervical adenopathy.    Left cervical: No superficial, deep or posterior cervical adenopathy.  Skin:    General: Skin is warm and dry.     Capillary Refill: Capillary refill takes less than 2 seconds.     Coloration: Skin is not jaundiced or pale.  Findings: No bruising, erythema, lesion or rash.  Neurological:     General: No focal deficit present.     Mental Status: She is alert and oriented to person, place, and time. Mental status is at baseline.     GCS: GCS eye subscore is 4. GCS verbal subscore is 5. GCS motor subscore is 6.     Sensory: Sensation is intact. No sensory deficit.     Motor: Motor function is intact. No weakness.     Coordination: Coordination is intact. Coordination normal.     Gait: Gait is intact. Gait normal.  Psychiatric:        Attention and Perception: Attention and perception normal.        Mood and Affect: Affect normal. Mood is anxious.        Speech: Speech normal.        Behavior: Behavior normal. Behavior is cooperative.        Thought Content: Thought content normal.        Cognition and Memory: Cognition and memory normal.        Judgment: Judgment normal.     Last depression screening scores    05/02/2023    9:19 AM 04/27/2022    8:14 AM 10/14/2021    8:20 AM  PHQ 2/9 Scores  PHQ - 2 Score 0 0 0  PHQ- 9 Score  2 3   Last fall risk screening    05/02/2023    9:19 AM  Fall Risk   Falls in the past year? 0  Injury with Fall? 0  Risk for fall due to : No Fall Risks  Follow up Falls evaluation completed   Last Audit-C alcohol use  screening    04/27/2022    8:15 AM  Alcohol Use Disorder Test (AUDIT)  1. How often do you have a drink containing alcohol? 2  2. How many drinks containing alcohol do you have on a typical day when you are drinking? 0  3. How often do you have six or more drinks on one occasion? 0  AUDIT-C Score 2   A score of 3 or more in women, and 4 or more in men indicates increased risk for alcohol abuse, EXCEPT if all of the points are from question 1   No results found for any visits on 05/02/23.  Assessment & Plan    Routine Health Maintenance and Physical Exam  Exercise Activities and Dietary recommendations  Goals   None     Immunization History  Administered Date(s) Administered   Influenza Inj Mdck Quad Pf 08/04/2017   Influenza Split 07/06/2012, 08/26/2013   Influenza,inj,Quad PF,6+ Mos 08/15/2018, 07/12/2019, 07/27/2021   Moderna SARS-COV2 Booster Vaccination 10/02/2020   PFIZER(Purple Top)SARS-COV-2 Vaccination 12/26/2019, 01/16/2020   Td 07/19/2005   Tdap 01/23/2015   Zoster Recombinant(Shingrix) 04/13/2021, 10/14/2021    Health Maintenance  Topic Date Due   COVID-19 Vaccine (4 - 2023-24 season) 06/17/2022   INFLUENZA VACCINE  05/18/2023   Colonoscopy  05/22/2024   MAMMOGRAM  12/05/2024   DTaP/Tdap/Td (3 - Td or Tdap) 01/22/2025   Hepatitis C Screening  Completed   HIV Screening  Completed   Zoster Vaccines- Shingrix  Completed   HPV VACCINES  Aged Out    Discussed health benefits of physical activity, and encouraged her to engage in regular exercise appropriate for her age and condition.  Problem List Items Addressed This Visit       Cardiovascular and Mediastinum   Essential hypertension  Chronic; well controlled  At goal of <139/<89 Continues of hydrochlorothiazide at 25 mg      Relevant Medications   hydrochlorothiazide (HYDRODIURIL) 25 MG tablet   Other Relevant Orders   Comprehensive metabolic panel   CBC with Differential/Platelet      Musculoskeletal and Integument   Cutaneous abscess of groin    Acute, self limiting Serous fluid on palpation Reports 'improved appearance' Currently 4 x 2.5 x 0.3 cm Recommend PO ABX to assist       Relevant Medications   sulfamethoxazole-trimethoprim (BACTRIM DS) 800-160 MG tablet     Other   Annual physical exam - Primary    Reports chronic insomnia Things to do to keep yourself healthy  - Exercise at least 30-45 minutes a day, 3-4 days a week.  - Eat a low-fat diet with lots of fruits and vegetables, up to 7-9 servings per day.  - Seatbelts can save your life. Wear them always.  - Smoke detectors on every level of your home, check batteries every year.  - Eye Doctor - have an eye exam every 1-2 years  - Safe sex - if you may be exposed to STDs, use a condom.  - Alcohol -  If you drink, do it moderately, less than 2 drinks per day.  - Health Care Power of Attorney. Choose someone to speak for you if you are not able.  - Depression is common in our stressful world.If you're feeling down or losing interest in things you normally enjoy, please come in for a visit.  - Violence - If anyone is threatening or hurting you, please call immediately. Repeat labs       Relevant Orders   Comprehensive metabolic panel   CBC with Differential/Platelet   Lipid Profile   HgB A1c   Hypercholesteremia    Chronic; previously elevated  The 10-year ASCVD risk score (Arnett DK, et al., 2019) is: 1.7% Repeat LP recommend diet low in saturated fat and regular exercise - 30 min at least 5 times per week       Relevant Medications   hydrochlorothiazide (HYDRODIURIL) 25 MG tablet   Other Relevant Orders   Lipid Profile   Morbid obesity (HCC)    Chronic; weight gain noted Previous weight noted at 247 now at 283 Body mass index is 47.18 kg/m.       Relevant Medications   metFORMIN (GLUCOPHAGE-XR) 750 MG 24 hr tablet   Other Relevant Orders   Comprehensive metabolic panel   CBC with  Differential/Platelet   Lipid Profile   HgB A1c   Pre-diabetes    Chronic; repeat A1c given previous pre-diabetes and weight gain noted in past year Body mass index is 47.18 kg/m. Continue to recommend balanced, lower carb meals. Smaller meal size, adding snacks. Choosing water as drink of choice and increasing purposeful exercise.       Relevant Medications   metFORMIN (GLUCOPHAGE-XR) 750 MG 24 hr tablet   Other Relevant Orders   Comprehensive metabolic panel   HgB A1c   Return in about 6 months (around 11/02/2023) for chonic disease management.    Leilani Merl, FNP, have reviewed all documentation for this visit. The documentation on 05/02/23 for the exam, diagnosis, procedures, and orders are all accurate and complete.  Jacky Kindle, FNP  [facility] [phone] (phone) [phone] (fax)  [facility]

## 2023-05-02 NOTE — Assessment & Plan Note (Signed)
Chronic; weight gain noted Previous weight noted at 247 now at 283 Body mass index is 47.18 kg/m.

## 2023-05-02 NOTE — Assessment & Plan Note (Signed)
Acute, self limiting Serous fluid on palpation Reports 'improved appearance' Currently 4 x 2.5 x 0.3 cm Recommend PO ABX to assist

## 2023-05-02 NOTE — Assessment & Plan Note (Signed)
Reports chronic insomnia Things to do to keep yourself healthy  - Exercise at least 30-45 minutes a day, 3-4 days a week.  - Eat a low-fat diet with lots of fruits and vegetables, up to 7-9 servings per day.  - Seatbelts can save your life. Wear them always.  - Smoke detectors on every level of your home, check batteries every year.  - Eye Doctor - have an eye exam every 1-2 years  - Safe sex - if you may be exposed to STDs, use a condom.  - Alcohol -  If you drink, do it moderately, less than 2 drinks per day.  - Health Care Power of Attorney. Choose someone to speak for you if you are not able.  - Depression is common in our stressful world.If you're feeling down or losing interest in things you normally enjoy, please come in for a visit.  - Violence - If anyone is threatening or hurting you, please call immediately. Repeat labs

## 2023-05-02 NOTE — Assessment & Plan Note (Signed)
Chronic; previously elevated  The 10-year ASCVD risk score (Arnett DK, et al., 2019) is: 1.7% Repeat LP recommend diet low in saturated fat and regular exercise - 30 min at least 5 times per week

## 2023-05-02 NOTE — Assessment & Plan Note (Signed)
Chronic; repeat A1c given previous pre-diabetes and weight gain noted in past year Body mass index is 47.18 kg/m. Continue to recommend balanced, lower carb meals. Smaller meal size, adding snacks. Choosing water as drink of choice and increasing purposeful exercise.

## 2023-05-03 LAB — LIPID PANEL
Chol/HDL Ratio: 3.7 ratio (ref 0.0–4.4)
Cholesterol, Total: 229 mg/dL — ABNORMAL HIGH (ref 100–199)
HDL: 62 mg/dL (ref >39)
LDL Chol Calc (NIH): 150 mg/dL — ABNORMAL HIGH (ref 0–99)
Triglycerides: 98 mg/dL (ref 0–149)
VLDL Cholesterol Cal: 17 mg/dL (ref 5–40)

## 2023-05-03 LAB — COMPREHENSIVE METABOLIC PANEL
ALT: 26 IU/L (ref 0–32)
AST: 25 IU/L (ref 0–40)
Albumin: 4.1 g/dL (ref 3.8–4.9)
Alkaline Phosphatase: 115 IU/L (ref 44–121)
BUN/Creatinine Ratio: 16 (ref 9–23)
BUN: 11 mg/dL (ref 6–24)
Bilirubin Total: 0.4 mg/dL (ref 0.0–1.2)
CO2: 27 mmol/L (ref 20–29)
Calcium: 9.7 mg/dL (ref 8.7–10.2)
Chloride: 100 mmol/L (ref 96–106)
Creatinine, Ser: 0.69 mg/dL (ref 0.57–1.00)
Globulin, Total: 2.5 g/dL (ref 1.5–4.5)
Glucose: 96 mg/dL (ref 70–99)
Potassium: 4.1 mmol/L (ref 3.5–5.2)
Sodium: 142 mmol/L (ref 134–144)
Total Protein: 6.6 g/dL (ref 6.0–8.5)
eGFR: 103 mL/min/{1.73_m2} (ref >59)

## 2023-05-03 LAB — CBC WITH DIFFERENTIAL/PLATELET
Basophils Absolute: 0.1 10*3/uL (ref 0.0–0.2)
Basos: 1 %
EOS (ABSOLUTE): 0.1 10*3/uL (ref 0.0–0.4)
Eos: 1 %
Hematocrit: 43.8 % (ref 34.0–46.6)
Hemoglobin: 14.4 g/dL (ref 11.1–15.9)
Immature Grans (Abs): 0 10*3/uL (ref 0.0–0.1)
Immature Granulocytes: 0 %
Lymphocytes Absolute: 1.6 10*3/uL (ref 0.7–3.1)
Lymphs: 19 %
MCH: 28.1 pg (ref 26.6–33.0)
MCHC: 32.9 g/dL (ref 31.5–35.7)
MCV: 85 fL (ref 79–97)
Monocytes Absolute: 0.7 10*3/uL (ref 0.1–0.9)
Monocytes: 9 %
Neutrophils Absolute: 5.6 10*3/uL (ref 1.4–7.0)
Neutrophils: 70 %
Platelets: 342 10*3/uL (ref 150–450)
RBC: 5.13 x10E6/uL (ref 3.77–5.28)
RDW: 13.4 % (ref 11.7–15.4)
WBC: 8.1 10*3/uL (ref 3.4–10.8)

## 2023-05-03 LAB — HEMOGLOBIN A1C
Est. average glucose Bld gHb Est-mCnc: 114 mg/dL
Hgb A1c MFr Bld: 5.6 % (ref 4.8–5.6)

## 2023-05-03 NOTE — Progress Notes (Signed)
LDL and total cholesterol increased; The 10-year ASCVD risk score (Arnett DK, et al., 2019) is: 2.3%. I continue to recommend diet low in saturated fat and regular exercise - 30 min at least 5 times per week   All other labs are normal/stable.

## 2023-05-11 ENCOUNTER — Encounter: Payer: Self-pay | Admitting: Family Medicine

## 2023-05-25 ENCOUNTER — Other Ambulatory Visit: Payer: Self-pay | Admitting: Family Medicine

## 2023-05-25 DIAGNOSIS — F5102 Adjustment insomnia: Secondary | ICD-10-CM

## 2023-05-26 NOTE — Telephone Encounter (Signed)
Requested Prescriptions  Pending Prescriptions Disp Refills   traZODone (DESYREL) 50 MG tablet [Pharmacy Med Name: TRAZODONE 50MG  TABLETS] 90 tablet 0    Sig: TAKE 1/2 TO 1 TABLET(25 TO 50 MG) BY MOUTH AT BEDTIME AS NEEDED FOR SLEEP     Psychiatry: Antidepressants - Serotonin Modulator Passed - 05/25/2023  8:08 AM      Passed - Valid encounter within last 6 months    Recent Outpatient Visits           3 weeks ago Annual physical exam   [facility] Jacky Kindle, FNP   1 year ago Annual physical exam   [facility] Jacky Kindle, FNP   1 year ago Morbid obesity [facility])   [place] [facility] Merita Norton T, FNP   1 year ago Surgical follow-up care   [facility] Merita Norton T, FNP   1 year ago Morbid obesity [facility])   [facility] Merita Norton T, FNP               meloxicam (MOBIC) 7.5 MG tablet [Pharmacy Med Name: MELOXICAM 7.5MG  TABLETS] 90 tablet 0    Sig: TAKE 1 TABLET(7.5 MG) BY MOUTH DAILY     Analgesics:  COX2 Inhibitors Failed - 05/25/2023  8:08 AM      Failed - Manual Review: Labs are only required if the patient has taken medication for more than 8 weeks.      Passed - HGB in normal range and within 360 days    Hemoglobin  Date Value Ref Range Status  05/02/2023 14.4 11.1 - 15.9 g/dL Final         Passed - Cr in normal range and within 360 days    Creatinine  Date Value Ref Range Status  01/03/2013 0.74 0.60 - 1.30 mg/dL Final   Creatinine, Ser  Date Value Ref Range Status  05/02/2023 0.69 0.57 - 1.00 mg/dL Final         Passed - HCT in normal range and within 360 days    Hematocrit  Date Value Ref Range Status  05/02/2023 43.8 34.0 - 46.6 % Final         Passed - AST in normal range and within 360 days    AST  Date Value Ref Range Status  05/02/2023 25 0 - 40 IU/L Final         Passed - ALT in normal range and within 360 days     ALT  Date Value Ref Range Status  05/02/2023 26 0 - 32 IU/L Final         Passed - eGFR is 30 or above and within 360 days    EGFR (African American)  Date Value Ref Range Status  01/03/2013 >60  Final   GFR calc Af Amer  Date Value Ref Range Status  04/03/2020 109 >59 mL/min/1.73 Final    Comment:    **[facility] currently reports eGFR in compliance with the current**   recommendations of the [employer]. [facility] will   update reporting as new guidelines are published from the NKF-ASN   Task force.    EGFR (Non-African Amer.)  Date Value Ref Range Status  01/03/2013 >60  Final    Comment:    eGFR values <17mL/min/1.73 m2 may be an indication of chronic kidney disease (CKD). Calculated eGFR is useful in patients with stable renal function. The eGFR calculation will  not be reliable in acutely ill patients when serum creatinine is changing rapidly. It is not useful in  patients on dialysis. The eGFR calculation may not be applicable to patients at the low and high extremes of body sizes, pregnant women, and vegetarians.    GFR, Estimated  Date Value Ref Range Status  07/15/2021 >60 >60 mL/min Final    Comment:    (NOTE) Calculated using the CKD-EPI Creatinine Equation (2021)    eGFR  Date Value Ref Range Status  05/02/2023 103 >59 mL/min/1.73 Final         Passed - Patient is not pregnant      Passed - Valid encounter within last 12 months    Recent Outpatient Visits           3 weeks ago Annual physical exam   [facility] Jacky Kindle, FNP   1 year ago Annual physical exam   [facility] Jacky [facility], FNP   1 year ago Morbid obesity [facility])   [facility] Jacky Kindle, FNP   1 year ago Surgical follow-up care   [facility] Jacky Kindle, FNP   1 year ago Morbid obesity [facility])   [facility] Jacky Kindle, [place]

## 2023-07-29 IMAGING — MG MM DIGITAL SCREENING BILAT W/ TOMO AND CAD
6 of 10 series · 6 of 30 positions shown · non-contrast
Comparison: Previous exam(s).

CLINICAL DATA: Screening.

EXAM:
DIGITAL SCREENING BILATERAL MAMMOGRAM WITH TOMOSYNTHESIS AND CAD
TECHNIQUE: Bilateral screening digital craniocaudal and mediolateral oblique
mammograms were obtained. Bilateral screening digital breast
tomosynthesis was performed. The images were evaluated with
computer-aided detection.

[L CV synth-2D]
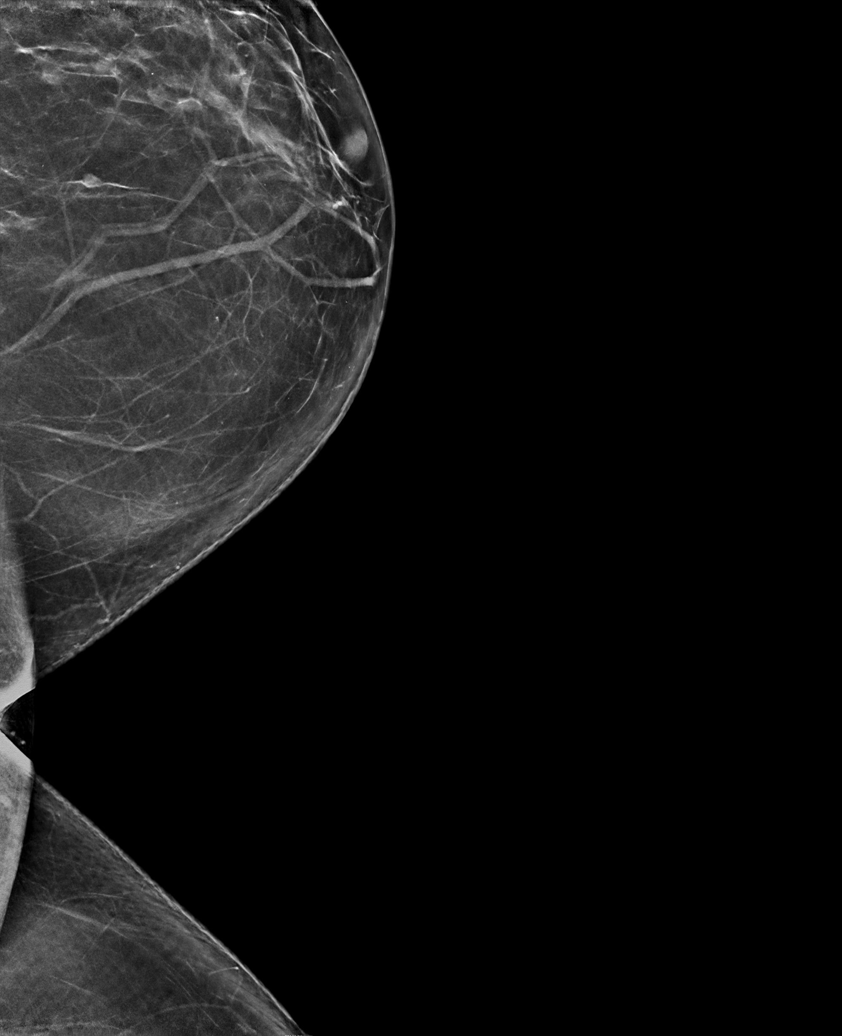

[R CC synth-2D]
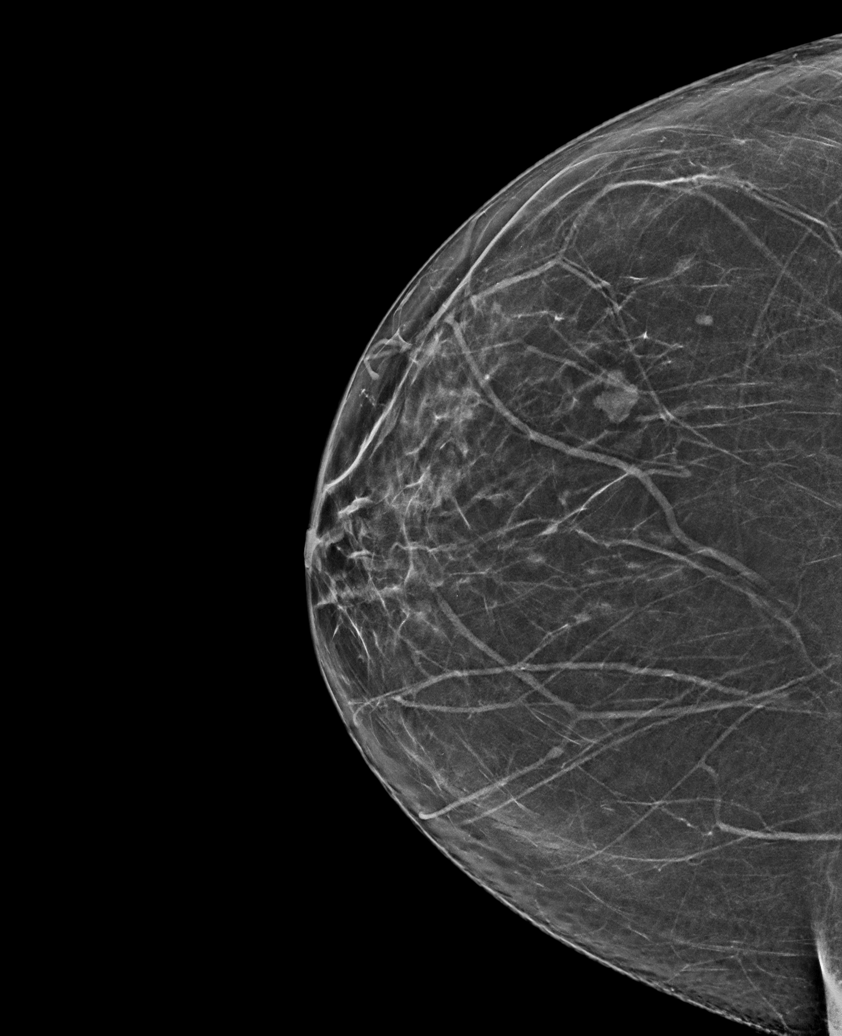

[L MLO synth-2D]
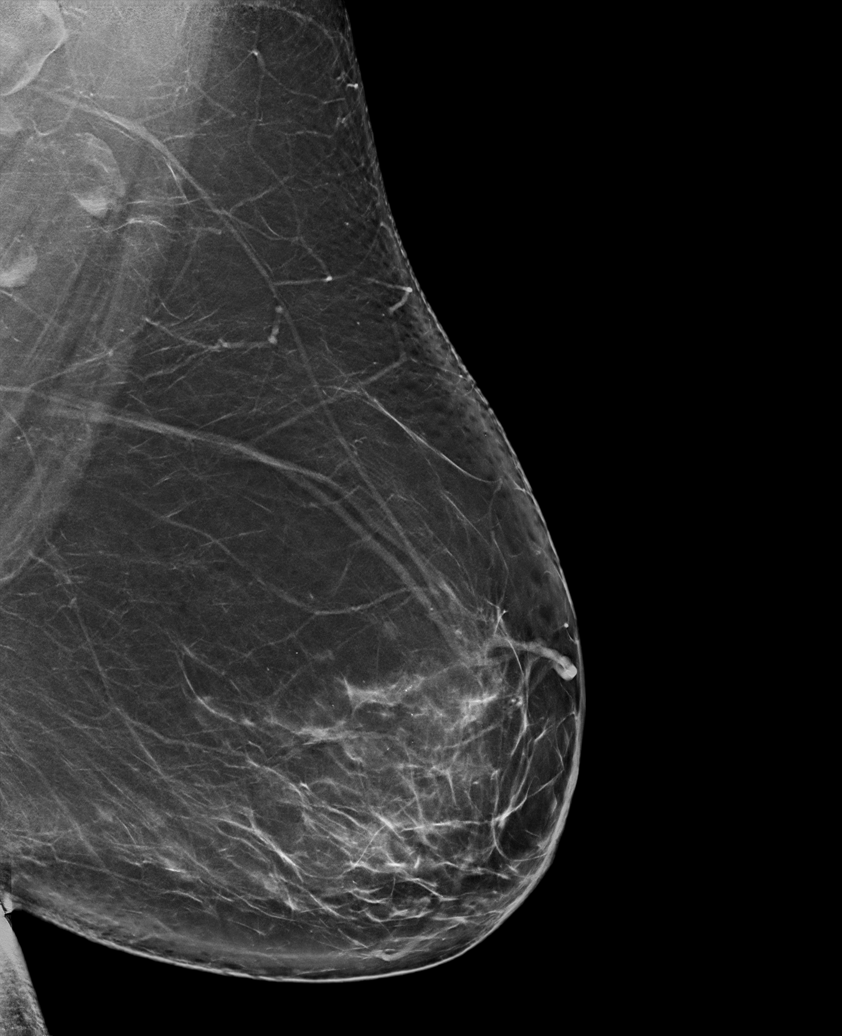

[R MLO synth-2D]
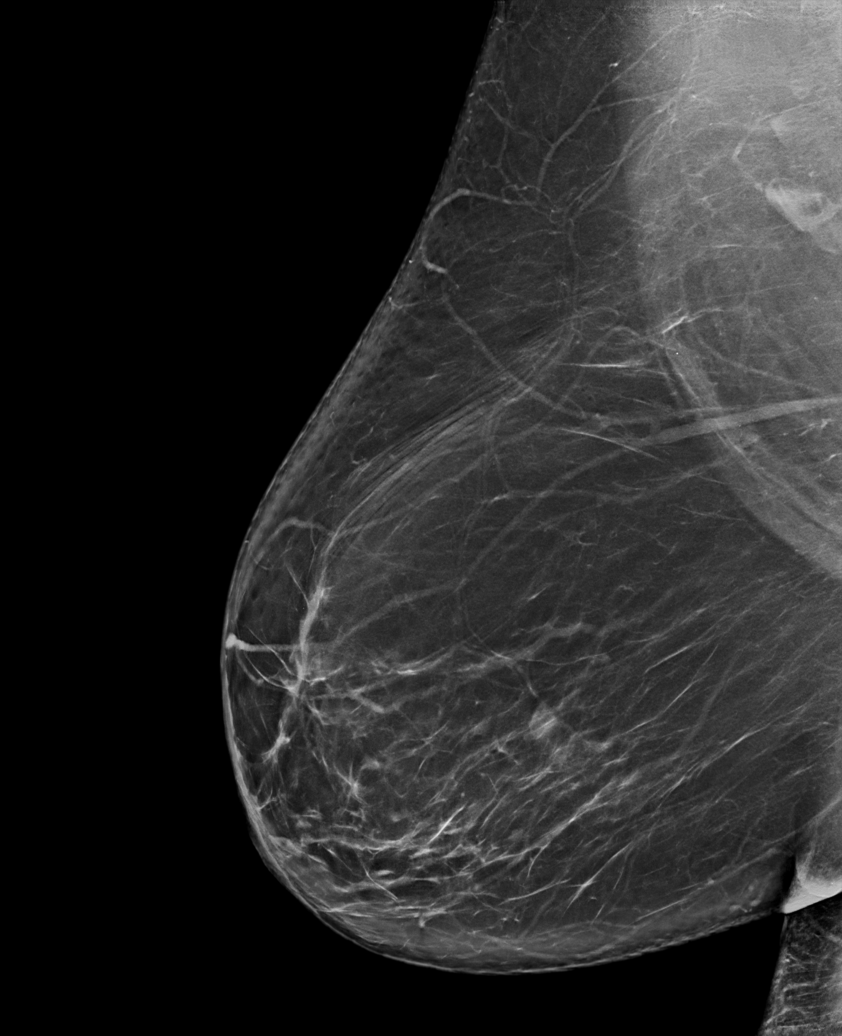

[L CC synth-2D]
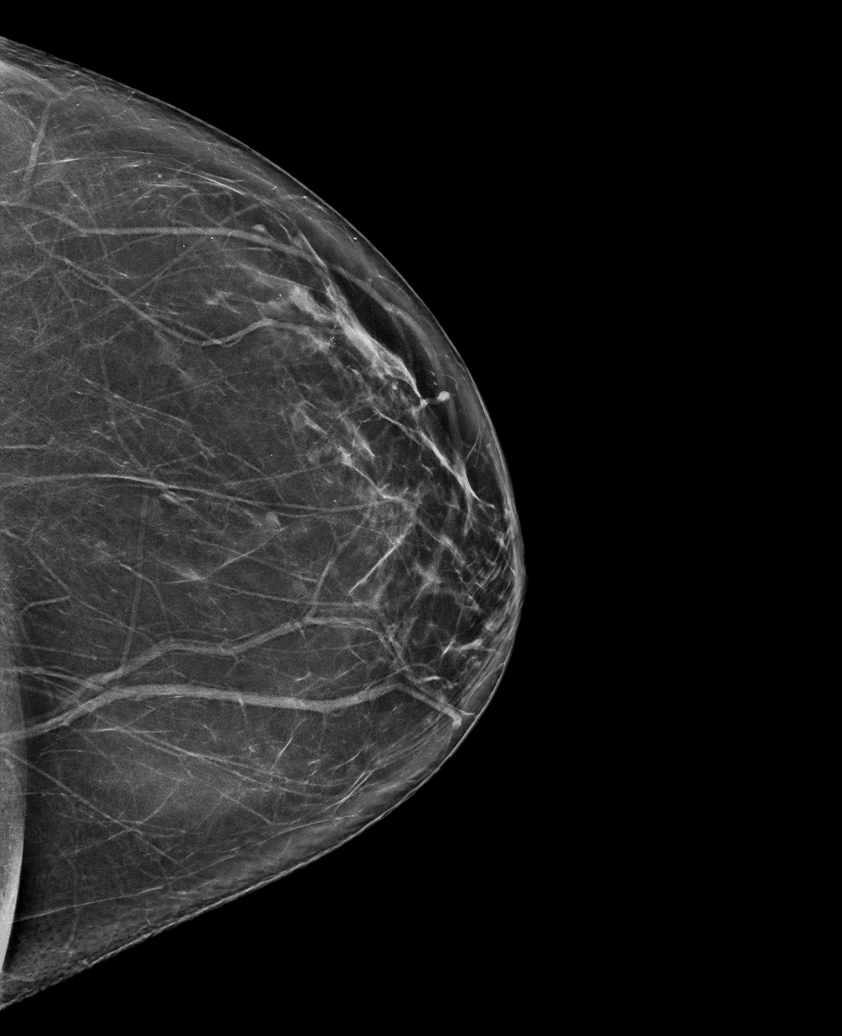

[L CV tomo · tomo slice 37/73.0]
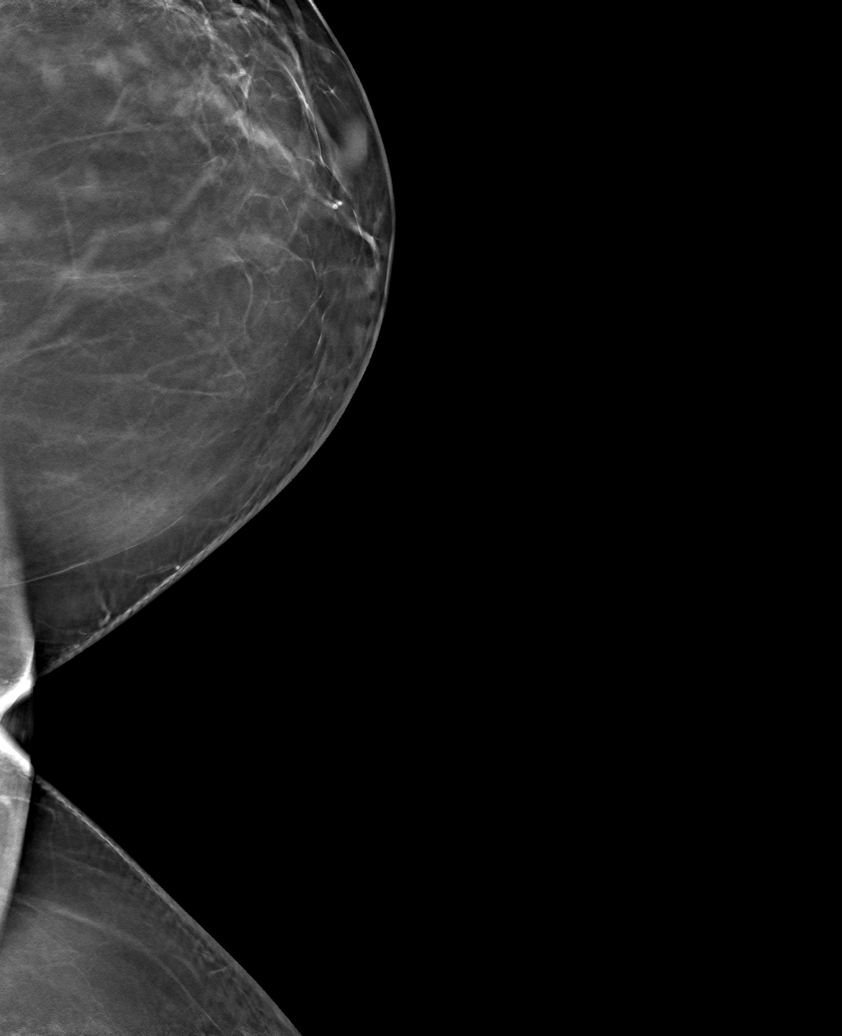

[6 of 30 positions shown; findings below may reference images not displayed]

ACR Breast Density Category b: There are scattered areas of
fibroglandular density.
FINDINGS: There are no findings suspicious for malignancy.
IMPRESSION: No mammographic evidence of malignancy. A result letter of this
screening mammogram will be mailed directly to the patient.

RECOMMENDATION:
Screening mammogram in one year. (Code:51-O-LD2)

BI-RADS CATEGORY  1: Negative.

## 2023-08-30 ENCOUNTER — Other Ambulatory Visit: Payer: Self-pay | Admitting: Family Medicine

## 2023-08-30 DIAGNOSIS — F5102 Adjustment insomnia: Secondary | ICD-10-CM

## 2023-08-31 NOTE — Telephone Encounter (Signed)
Requested Prescriptions  Pending Prescriptions Disp Refills   traZODone (DESYREL) 50 MG tablet [Pharmacy Med Name: TRAZODONE 50MG  TABLETS] 90 tablet 0    Sig: TAKE 1/2 TO 1 TABLET(25 TO 50 MG) BY MOUTH AT BEDTIME AS NEEDED FOR SLEEP     Psychiatry: Antidepressants - Serotonin Modulator Passed - 08/30/2023  7:46 PM      Passed - Valid encounter within last 6 months    Recent Outpatient Visits           4 months ago Annual physical exam   [facility]y Kindle, FNP   1 year ago Annual physical exam   [facility]y [facility], FNP   1 year ago Morbid obesity [facility])   [facility] Jacky Kindle, FNP   1 year ago Surgical follow-up care   [facility] Merita Norton T, FNP   2 years ago Morbid obesity [facility])   [facility] Jacky Kindle, [place]

## 2023-11-27 ENCOUNTER — Telehealth: Payer: Self-pay | Admitting: Family Medicine

## 2023-11-27 ENCOUNTER — Other Ambulatory Visit: Payer: Self-pay

## 2023-11-27 DIAGNOSIS — I10 Essential (primary) hypertension: Secondary | ICD-10-CM

## 2023-11-27 MED ORDER — HYDROCHLOROTHIAZIDE 25 MG PO TABS: 25.0000 mg | ORAL_TABLET | Freq: Every day | ORAL | 0 refills | Status: DC

## 2023-11-27 NOTE — Telephone Encounter (Signed)
[facility] faxed refill request for the following medications:  hydrochlorothiazide (HYDRODIURIL) 25 MG tablet    Please advise.  

## 2023-12-18 ENCOUNTER — Telehealth: Payer: Self-pay | Admitting: Family Medicine

## 2023-12-18 NOTE — Telephone Encounter (Signed)
 [facility] pharmacy is requesting refill meloxicam (MOBIC) 7.5 MG tablet  Please advise

## 2023-12-19 ENCOUNTER — Other Ambulatory Visit: Payer: Self-pay | Admitting: Family Medicine

## 2023-12-19 NOTE — Telephone Encounter (Signed)
 Pt has not been seen in office in over 6 months. Needs appt.

## 2023-12-25 ENCOUNTER — Other Ambulatory Visit: Payer: Self-pay | Admitting: Family Medicine

## 2023-12-25 ENCOUNTER — Telehealth: Payer: Self-pay

## 2023-12-25 DIAGNOSIS — Z1231 Encounter for screening mammogram for malignant neoplasm of breast: Secondary | ICD-10-CM

## 2023-12-25 NOTE — Telephone Encounter (Signed)
 Copied from CRM [id number]. Topic: Clinical - Request for Lab/Test Order >> Dec 25, 2023  9:29 AM Karen Morrison wrote: Reason for CRM: Patient is calling to request an order to be sent to [facility] for her mammogram. Please advise

## 2023-12-25 NOTE — Telephone Encounter (Signed)
 Copied from CRM [id number]. Topic: Clinical - Medication Refill >> Dec 25, 2023  9:24 AM Alessandra Bevels wrote: Most Recent Primary Care Visit:  Provider: Merita Norton T  Department: [id number]  Visit Type: PHYSICAL  Date: 05/02/2023  Medication: meloxicam (MOBIC) 7.5 MG tablet [[id number]] Patient is requesting a 90 day script  The last script refill patient only received 30 day  Has the patient contacted their pharmacy? Yes (Agent: If no, request that the patient contact the pharmacy for the refill. If patient does not wish to contact the pharmacy document the reason why and proceed with request.) (Agent: If yes, when and what did the pharmacy advise?)  Is this the correct pharmacy for this prescription? Yes If no, delete pharmacy and type the correct one.  This is the patient's preferred pharmacy:     [id number] [place], [place] - [address] AT [facility] OF [address] [place] [address] Phone: [phone] Fax: [phone]   Has the prescription been filled recently? Yes  Is the patient out of the medication? Yes  Has the patient been seen for an appointment in the last year OR does the patient have an upcoming appointment? Yes  Can we respond through MyChart? Yes  Agent: Please be advised that Rx refills may take up to 3 business days. We ask that you follow-up with your pharmacy.

## 2023-12-26 MED ORDER — MELOXICAM 7.5 MG PO TABS: 7.5000 mg | ORAL_TABLET | Freq: Every day | ORAL | 0 refills | Status: DC

## 2024-01-02 ENCOUNTER — Ambulatory Visit
Admission: RE | Admit: 2024-01-02 | Discharge: 2024-01-02 | Disposition: A | Discharge status: Home / Self Care | Source: Ambulatory Visit | Attending: Family Medicine | Admitting: Family Medicine

## 2024-01-02 DIAGNOSIS — Z1231 Encounter for screening mammogram for malignant neoplasm of breast: Secondary | ICD-10-CM | POA: Diagnosis present

## 2024-03-25 ENCOUNTER — Other Ambulatory Visit: Payer: Self-pay | Admitting: Family Medicine

## 2024-05-02 ENCOUNTER — Encounter: Payer: Self-pay | Admitting: Family Medicine

## 2024-05-02 ENCOUNTER — Ambulatory Visit: Payer: Self-pay | Admitting: Family Medicine

## 2024-05-02 VITALS — BP 130/73 | HR 102 | Resp 14 | Ht 65.0 in | Wt 303.5 lb

## 2024-05-02 DIAGNOSIS — E7800 Pure hypercholesterolemia, unspecified: Secondary | ICD-10-CM

## 2024-05-02 DIAGNOSIS — Z6841 Body Mass Index (BMI) 40.0 and over, adult: Secondary | ICD-10-CM

## 2024-05-02 DIAGNOSIS — Z0001 Encounter for general adult medical examination with abnormal findings: Secondary | ICD-10-CM | POA: Diagnosis not present

## 2024-05-02 DIAGNOSIS — Z96653 Presence of artificial knee joint, bilateral: Secondary | ICD-10-CM

## 2024-05-02 DIAGNOSIS — Z789 Other specified health status: Secondary | ICD-10-CM

## 2024-05-02 DIAGNOSIS — Z713 Dietary counseling and surveillance: Secondary | ICD-10-CM

## 2024-05-02 DIAGNOSIS — R202 Paresthesia of skin: Secondary | ICD-10-CM

## 2024-05-02 DIAGNOSIS — Z0000 Encounter for general adult medical examination without abnormal findings: Secondary | ICD-10-CM

## 2024-05-02 DIAGNOSIS — R7309 Other abnormal glucose: Secondary | ICD-10-CM

## 2024-05-02 DIAGNOSIS — I10 Essential (primary) hypertension: Secondary | ICD-10-CM

## 2024-05-02 MED ORDER — MELOXICAM 7.5 MG PO TABS: 7.5000 mg | ORAL_TABLET | Freq: Every day | ORAL | 3 refills | Status: AC

## 2024-05-02 MED ORDER — PHENTERMINE HCL 37.5 MG PO CAPS: 37.5000 mg | ORAL_CAPSULE | ORAL | 0 refills | Status: DC

## 2024-05-02 MED ORDER — HYDROCHLOROTHIAZIDE 25 MG PO TABS: 25.0000 mg | ORAL_TABLET | Freq: Every day | ORAL | 3 refills | Status: AC

## 2024-05-02 NOTE — Progress Notes (Signed)
 Complete physical exam   Patient: Karen Morrison   DOB: 06-24-1971   53 y.o. Female  MRN: [id number] Visit Date: 05/02/2024  Today's healthcare provider: LAURAINE LOISE BUOY, DO   Chief Complaint  Patient presents with   Annual Exam    Sleeping pattern: 6-7 hours of sleep Exercise: not often, just a little of walks No concerns. Patient is fasting.   Subjective    GENESSA BEMAN is a 53 y.o. female who presents today for a complete physical exam.  She reports consuming a general diet. The patient does not participate in regular exercise at present. She generally feels well. She reports sleeping well. She does not have additional problems to discuss today.   HPI HPI     Annual Exam    Additional comments: Sleeping pattern: 6-7 hours of sleep Exercise: not often, just a little of walks No concerns. Patient is fasting.      Last edited by Wilfred Hargis RAMAN, CMA on 05/02/2024  8:18 AM.      Inocente LULLA Seats is a 53 year old female who presents for an annual physical exam.  She experiences a lack of motivation to exercise, attributing this to family and work-related stress over the past two years. She previously achieved significant weight loss using phentermine , losing about thirty pounds in a month. Despite having three refills available, she only used one bottle of the medication. The only side effect was difficulty sleeping, which she managed by taking the medication early in the morning.  She has a history of knee replacement surgery approximately two to three years ago, during which a nerve block resulted in numbness in her foot. She consulted a neurologist and was prescribed vitamin B12 after being found to be deficient in 2023, which helped improve her condition. She can now move her foot and flex her toes, although she still experiences occasional numbness. She takes meloxicam  7.5 mg daily for her knees. She reports that degenerative arthritis runs in her family, and both her mother  and sisters have had knee replacements.  She reports occasional headaches and regular eye exams. Her past medical history includes anemia during pregnancy, but she has not experienced any issues since. She mentions that her A1c was at the top of the normal range in 2022, but she has no history of diabetes. Her family history includes degenerative arthritis, as both her mother and sisters have had knee replacements.  She is not currently taking vitamin D  or B12 supplements, but she uses an over-the-counter multivitamin. She has had reactions to the COVID and shingles vaccines in the past, experiencing flu-like symptoms after administration.    Past Medical History:  Diagnosis Date   Allergic rhinitis    Anemia    during pregnancy only   Arthritis    joints   BMI 45.0-49.9, adult (HCC) 01/30/2017   Family history of adverse reaction to anesthesia    dad-n/v   Hypercholesteremia    Hypertension    controlled on medicine   Motion sickness    Obesity    PONV (postoperative nausea and vomiting)    scopalamine patch is the only thing that works for her   Pre-diabetes    last a1c on 04-13-21 was 5.7   Wears partial dentures    lower   Past Surgical History:  Procedure Laterality Date   CESAREAN SECTION  1994   CHOLECYSTECTOMY  1999   COLONOSCOPY WITH PROPOFOL  N/A 05/23/2019   Procedure: COLONOSCOPY WITH  PROPOFOL ;  Surgeon: Therisa Bi, MD;  Location: [facility];  Service: Endoscopy;  Laterality: N/A;   CYSTOSCOPY  04/04/2017   Procedure: CYSTOSCOPY;  Surgeon: Arloa Lamar SQUIBB, MD;  Location: [facility];  Service: Gynecology;;   KNEE ARTHROPLASTY Right 07/26/2021   Procedure: COMPUTER ASSISTED TOTAL KNEE ARTHROPLASTY;  Surgeon: Mardee Lynwood SQUIBB, MD;  Location: [facility];  Service: Orthopedics;  Laterality: Right;   POLYPECTOMY  05/23/2019   Procedure: POLYPECTOMY;  Surgeon: Therisa Bi, MD;  Location: [facility];  Service: Endoscopy;;   REPLACEMENT TOTAL KNEE Left  01/02/2013   TOTAL LAPAROSCOPIC HYSTERECTOMY WITH SALPINGECTOMY Bilateral 04/04/2017   Procedure: HYSTERECTOMY TOTAL LAPAROSCOPIC WITH SALPINGECTOMY;  Surgeon: Arloa Lamar SQUIBB, MD;  Location: [facility];  Service: Gynecology;  Laterality: Bilateral;   VAGINAL BIRTH AFTER CESAREAN SECTION     Social History   Socioeconomic History   Marital status: Married    Spouse name: Ronnie   Number of children: 2   Years of education: HS   Highest education level: Not on file  Occupational History   Not on file  Tobacco Use   Smoking status: Never   Smokeless tobacco: Never  Vaping Use   Vaping status: Never Used  Substance and Sexual Activity   Alcohol use: Yes    Alcohol/week: 2.0 standard drinks of alcohol    Types: 2 Glasses of wine per week   Drug use: No   Sexual activity: Yes    Birth control/protection: Pill, None  Other Topics Concern   Not on file  Social History Narrative   Not on file   Social Drivers of Health   Financial Resource Strain: Low Risk  (05/02/2024)   Overall Financial Resource Strain (CARDIA)    Difficulty of Paying Living Expenses: Not hard at all  Food Insecurity: No Food Insecurity (05/02/2024)   Hunger Vital Sign    Worried About Running Out of Food in the Last Year: Never true    Ran Out of Food in the Last Year: Never true  Transportation Needs: No Transportation Needs (05/02/2024)   PRAPARE - Administrator, Civil Service (Medical): No    Lack of Transportation (Non-Medical): No  Physical Activity: Insufficiently Active (05/02/2024)   Exercise Vital Sign    Days of Exercise per Week: 1 day    Minutes of Exercise per Session: 30 min  Stress: No Stress Concern Present (05/02/2024)   [employer] of Occupational Health - Occupational Stress Questionnaire    Feeling of Stress: Only a little  Social Connections: Moderately Isolated (05/02/2024)   Social Connection and Isolation Panel    Frequency of Communication with Friends and Family:  Once a week    Frequency of Social Gatherings with Friends and Family: Twice a week    Attends Religious Services: Never    Database administrator or Organizations: No    Attends Banker Meetings: Never    Marital Status: Married  Catering manager Violence: Not At Risk (05/02/2024)   Humiliation, Afraid, Rape, and Kick questionnaire    Fear of Current or Ex-Partner: No    Emotionally Abused: No    Physically Abused: No    Sexually Abused: No   Family Status  Relation Name Status   Mother Sabrina Bull Deceased at age 18   Pat Aunt  Alive   MGM  Alive   MGF  Deceased   PGF  Deceased   PGM  Deceased   Father  Alive  Sister  Alive   Daughter  Alive   Son  Alive   Sister  Alive  No partnership data on file   Family History  Problem Relation Age of Onset   Diabetes Mother        Type 2   Breast cancer Paternal Aunt        40s?   Alzheimer's disease Maternal Grandmother    Heart attack Maternal Grandfather    Emphysema Paternal Grandfather    Healthy Father    Healthy Sister    Healthy Daughter    Healthy Son    Healthy Sister    Allergies  Allergen Reactions   Hydrocodone-Acetaminophen  Nausea And Vomiting   Amoxicillin Hives, Itching and Rash   Apple Cider Vinegar Rash   Azithromycin Hives, Itching and Rash   Doxycycline  Rash   Penicillins Hives, Itching and Rash    IgE = 66 (WNL) on 07/15/2021  Has patient had a PCN reaction causing immediate rash, facial/tongue/throat swelling, SOB or lightheadedness with hypotension: Yes Has patient had a PCN reaction causing severe rash involving mucus membranes or skin necrosis: Yes Has patient had a PCN reaction that required hospitalization: No Has patient had a PCN reaction occurring within the last 10 years: No If all of the above answers are NO, then may proceed with Cephalosporin use.     Patient Care Team: Donzella Lauraine SAILOR, DO as PCP - General (Family Medicine)   Medications: Outpatient  Medications Prior to Visit  Medication Sig   acetaminophen  (TYLENOL ) 500 MG tablet Take 500 mg by mouth every 6 (six) hours as needed.   Multiple Vitamin (MULTIVITAMIN) tablet Take 1 tablet by mouth daily.   senna (SENOKOT) 8.6 MG tablet Take 1 tablet by mouth daily as needed for constipation.   [DISCONTINUED] hydrochlorothiazide  (HYDRODIURIL ) 25 MG tablet Take 1 tablet (25 mg total) by mouth daily. TAKE 1 TABLET(25 MG) BY MOUTH EVERY MORNING   [DISCONTINUED] meloxicam  (MOBIC ) 7.5 MG tablet Take 1 tablet (7.5 mg total) by mouth daily.   [DISCONTINUED] cholecalciferol (VITAMIN D3) 25 MCG (1000 UNIT) tablet Take 1,000 Units by mouth daily. (Patient not taking: Reported on 05/02/2023)   [DISCONTINUED] Cyanocobalamin (B-12 COMPLIANCE INJECTION) 1000 MCG/ML KIT Inject as directed. (Patient not taking: Reported on 04/27/2022)   [DISCONTINUED] metFORMIN  (GLUCOPHAGE -XR) 750 MG 24 hr tablet Take 1 tablet (750 mg total) by mouth daily with breakfast. TAKE 1 TABLET(750 MG) BY MOUTH DAILY WITH BREAKFAST   [DISCONTINUED] phentermine  37.5 MG capsule Take 1 capsule (37.5 mg total) by mouth every morning. (Patient not taking: Reported on 05/02/2023)   [DISCONTINUED] Psyllium (METAMUCIL FIBER) 51.7 % PACK Take 1 packet by mouth in the morning. (Patient not taking: Reported on 05/02/2023)   [DISCONTINUED] sulfamethoxazole -trimethoprim  (BACTRIM  DS) 800-160 MG tablet Take 1 tablet by mouth 2 (two) times daily.   [DISCONTINUED] traZODone  (DESYREL ) 50 MG tablet TAKE 1/2 TO 1 TABLET(25 TO 50 MG) BY MOUTH AT BEDTIME AS NEEDED FOR SLEEP   No facility-administered medications prior to visit.    Review of Systems  Constitutional:  Negative for chills, fatigue and fever.  HENT:  Negative for congestion, ear pain, rhinorrhea, sneezing and sore throat.   Eyes: Negative.  Negative for pain and redness.  Respiratory:  Negative for cough, shortness of breath and wheezing.   Cardiovascular:  Negative for chest pain and leg  swelling.  Gastrointestinal:  Negative for abdominal pain, blood in stool, constipation, diarrhea and nausea.  Endocrine: Negative for polydipsia and polyphagia.  Genitourinary: Negative.  Negative for dysuria, flank pain, hematuria, pelvic pain, vaginal bleeding and vaginal discharge.  Musculoskeletal:  Negative for arthralgias, back pain, gait problem and joint swelling.  Skin:  Negative for rash.  Neurological:  Positive for numbness (R foot). Negative for dizziness, tremors, seizures, weakness, light-headedness and headaches.  Hematological:  Negative for adenopathy.  Psychiatric/Behavioral: Negative.  Negative for behavioral problems, confusion and dysphoric mood. The patient is not nervous/anxious and is not hyperactive.        Objective    BP 130/73 (BP Location: Left Arm, Patient Position: Sitting, Cuff Size: Large)   Pulse (!) 102   Resp 14   Ht 5' 5 (1.651 m)   Wt (!) 303 lb 8 oz (137.7 kg)   LMP 03/16/2017 (Exact Date)   SpO2 99%   BMI 50.51 kg/m    Physical Exam Vitals and nursing note reviewed.  Constitutional:      General: She is awake.     Appearance: Normal appearance. She is morbidly obese.  HENT:     Head: Normocephalic and atraumatic.     Right Ear: Tympanic membrane, ear canal and external ear normal.     Left Ear: Tympanic membrane, ear canal and external ear normal.     Nose: Nose normal.     Mouth/Throat:     Mouth: Mucous membranes are moist.     Pharynx: Oropharynx is clear. No oropharyngeal exudate or posterior oropharyngeal erythema.  Eyes:     General: No scleral icterus.    Extraocular Movements: Extraocular movements intact.     Conjunctiva/sclera: Conjunctivae normal.     Pupils: Pupils are equal, round, and reactive to light.  Neck:     Thyroid: No thyromegaly or thyroid tenderness.  Cardiovascular:     Rate and Rhythm: Normal rate and regular rhythm.     Pulses: Normal pulses.     Heart sounds: Normal heart sounds.  Pulmonary:      Effort: Pulmonary effort is normal. No tachypnea, bradypnea or respiratory distress.     Breath sounds: Normal breath sounds. No stridor. No wheezing, rhonchi or rales.  Abdominal:     General: Bowel sounds are normal. There is no distension.     Palpations: Abdomen is soft. There is no mass.     Tenderness: There is no abdominal tenderness. There is no guarding.     Hernia: No hernia is present.  Musculoskeletal:     Cervical back: Normal range of motion and neck supple.     Right lower leg: No edema.     Left lower leg: No edema.  Lymphadenopathy:     Cervical: No cervical adenopathy.  Skin:    General: Skin is warm and dry.  Neurological:     Mental Status: She is alert and oriented to person, place, and time. Mental status is at baseline.  Psychiatric:        Mood and Affect: Mood normal.        Behavior: Behavior normal.      Last depression screening scores    05/02/2024    8:20 AM 05/02/2023    9:19 AM 04/27/2022    8:14 AM  PHQ 2/9 Scores  PHQ - 2 Score 1 0 0  PHQ- 9 Score 3  2   Last fall risk screening    05/02/2024    8:20 AM  Fall Risk   Falls in the past year? 0  Number falls in past yr: 0  Injury with Fall? 0  Risk  for fall due to : No Fall Risks   Last Audit-C alcohol use screening    05/02/2024    8:14 AM  Alcohol Use Disorder Test (AUDIT)  1. How often do you have a drink containing alcohol? 1  2. How many drinks containing alcohol do you have on a typical day when you are drinking? 0  3. How often do you have six or more drinks on one occasion? 0  AUDIT-C Score 1   A score of 3 or more in women, and 4 or more in men indicates increased risk for alcohol abuse, EXCEPT if all of the points are from question 1   Results for orders placed or performed in visit on 05/02/24  Comprehensive metabolic panel with GFR  Result Value Ref Range   Glucose 100 (H) 70 - 99 mg/dL   BUN 12 6 - 24 mg/dL   Creatinine, Ser 9.22 0.57 - 1.00 mg/dL   eGFR 91 >40  fO/fpw/8.26   BUN/Creatinine Ratio 16 9 - 23   Sodium 140 134 - 144 mmol/L   Potassium 4.3 3.5 - 5.2 mmol/L   Chloride 101 96 - 106 mmol/L   CO2 22 20 - 29 mmol/L   Calcium 9.2 8.7 - 10.2 mg/dL   Total Protein 6.7 6.0 - 8.5 g/dL   Albumin 4.2 3.8 - 4.9 g/dL   Globulin, Total 2.5 1.5 - 4.5 g/dL   Bilirubin Total 0.5 0.0 - 1.2 mg/dL   Alkaline Phosphatase 125 (H) 44 - 121 IU/L   AST 17 0 - 40 IU/L   ALT 14 0 - 32 IU/L  Hemoglobin A1c  Result Value Ref Range   Hgb A1c MFr Bld 5.8 (H) 4.8 - 5.6 %   Est. average glucose Bld gHb Est-mCnc 120 mg/dL  Lipid panel  Result Value Ref Range   Cholesterol, Total 215 (H) 100 - 199 mg/dL   Triglycerides 89 0 - 149 mg/dL   HDL 56 >60 mg/dL   VLDL Cholesterol Cal 16 5 - 40 mg/dL   LDL Chol Calc (NIH) 856 (H) 0 - 99 mg/dL   Chol/HDL Ratio 3.8 0.0 - 4.4 ratio  VITAMIN D  25 Hydroxy (Vit-D Deficiency, Fractures)  Result Value Ref Range   Vit D, 25-Hydroxy 24.3 (L) 30.0 - 100.0 ng/mL  Vitamin B12  Result Value Ref Range   Vitamin B-12 627 232 - 1,245 pg/mL  Hepatitis B surface antibody,qualitative  Result Value Ref Range   Hep B Surface Ab, Qual Non Reactive     Assessment & Plan    Routine Health Maintenance and Physical Exam  Exercise Activities and Dietary recommendations  Goals      Increase physical activity        Immunization History  Administered Date(s) Administered   Influenza Inj Mdck Quad Pf 08/04/2017   Influenza Split 07/06/2012, 08/26/2013   Influenza,inj,Quad PF,6+ Mos 08/15/2018, 07/12/2019, 07/27/2021   Moderna SARS-COV2 Booster Vaccination 10/02/2020   PFIZER(Purple Top)SARS-COV-2 Vaccination 12/26/2019, 01/16/2020   Td 07/19/2005   Tdap 01/23/2015   Zoster Recombinant(Shingrix ) 04/13/2021, 10/14/2021    Health Maintenance  Topic Date Due   Hepatitis B Vaccines (1 of 3 - 19+ 3-dose series) Never done   Colonoscopy  05/22/2024   COVID-19 Vaccine (4 - 2024-25 season) 07/17/2024 (Originally 06/18/2023)    INFLUENZA VACCINE  05/17/2024   MAMMOGRAM  01/01/2025   DTaP/Tdap/Td (3 - Td or Tdap) 01/22/2025   Hepatitis C Screening  Completed   HIV Screening  Completed  Zoster Vaccines- Shingrix   Completed   HPV VACCINES  Aged Out   Meningococcal B Vaccine  Aged Out    Discussed health benefits of physical activity, and encouraged her to engage in regular exercise appropriate for her age and condition.   Annual physical exam  Morbid obesity with BMI of 50.0-59.9, adult (HCC)  Weight loss counseling, encounter for  History of total bilateral knee replacement -     Meloxicam ; Take 1 tablet (7.5 mg total) by mouth daily.  Dispense: 90 tablet; Refill: 3  Paresthesia -     VITAMIN D  25 Hydroxy (Vit-D Deficiency, Fractures) -     Vitamin B12  Essential hypertension -     hydroCHLOROthiazide ; Take 1 tablet (25 mg total) by mouth daily. TAKE 1 TABLET(25 MG) BY MOUTH EVERY MORNING  Dispense: 90 tablet; Refill: 3 -     Comprehensive metabolic panel with GFR  Elevated hemoglobin A1c -     Hemoglobin A1c  Hypercholesteremia -     Lipid panel  Hepatitis B vaccination status unknown -     Hepatitis B surface antibody,qualitative      Annual physical exam Physical exam overall unremarkable except as noted above. Routine lab work ordered as noted. Due for colonoscopy in August due to previous polyps. Uncertain hepatitis B vaccination status. Adverse reactions to COVID-19 and shingles vaccines. - Order hepatitis B surface antibody test. - Advise scheduling colonoscopy with [facility] in August.  Morbid obesity with a BMI of 50.0-59.9; weight loss counseling She lost 30 pounds with phentermine  but struggles to maintain due to stress. Prefers dietary moderation, not interested in bariatric surgery. Open to phentermine , aware of its short-term use and minimal side effects. - Prescribe phentermine  37.5 mg for weight loss. - Follow up in 3 months to assess progress.  Degenerative arthritis  with bilateral knee replacements Total knee replacements completed. Meloxicam  used for pain management. - Continue meloxicam  7.5 mg daily. - Send meloxicam  prescription to [facility] for 90-day supply.  Paresthesia secondary to vitamin deficiency versus postsurgical complication Intermittent numbness and tingling post-knee surgery. Neurologist-prescribed vitamins improved symptoms. Nerve expected to heal over time. - Check vitamin B12 and vitamin D  levels.  Hypertension Chronic, stable.  No changes. - Continue hydrochlorothiazide  25 mg daily    Return in about 3 months (around 08/02/2024) for Weight.     I discussed the assessment and treatment plan with the patient  The patient was provided an opportunity to ask questions and all were answered. The patient agreed with the plan and demonstrated an understanding of the instructions.   The patient was advised to call back or seek an in-person evaluation if the symptoms worsen or if the condition fails to improve as anticipated.    LAURAINE LOISE BUOY, DO  [facility] [phone] (phone) [phone] (fax)  [facility]

## 2024-05-02 NOTE — Patient Instructions (Signed)
 Contact [facility] to schedule your next colonoscopy (due August 2025)

## 2024-05-03 LAB — COMPREHENSIVE METABOLIC PANEL WITH GFR
ALT: 14 IU/L (ref 0–32)
AST: 17 IU/L (ref 0–40)
Albumin: 4.2 g/dL (ref 3.8–4.9)
Alkaline Phosphatase: 125 IU/L — ABNORMAL HIGH (ref 44–121)
BUN/Creatinine Ratio: 16 (ref 9–23)
BUN: 12 mg/dL (ref 6–24)
Bilirubin Total: 0.5 mg/dL (ref 0.0–1.2)
CO2: 22 mmol/L (ref 20–29)
Calcium: 9.2 mg/dL (ref 8.7–10.2)
Chloride: 101 mmol/L (ref 96–106)
Creatinine, Ser: 0.77 mg/dL (ref 0.57–1.00)
Globulin, Total: 2.5 g/dL (ref 1.5–4.5)
Glucose: 100 mg/dL — ABNORMAL HIGH (ref 70–99)
Potassium: 4.3 mmol/L (ref 3.5–5.2)
Sodium: 140 mmol/L (ref 134–144)
Total Protein: 6.7 g/dL (ref 6.0–8.5)
eGFR: 91 mL/min/1.73 (ref >59)

## 2024-05-03 LAB — LIPID PANEL
Chol/HDL Ratio: 3.8 ratio (ref 0.0–4.4)
Cholesterol, Total: 215 mg/dL — ABNORMAL HIGH (ref 100–199)
HDL: 56 mg/dL (ref >39)
LDL Chol Calc (NIH): 143 mg/dL — ABNORMAL HIGH (ref 0–99)
Triglycerides: 89 mg/dL (ref 0–149)
VLDL Cholesterol Cal: 16 mg/dL (ref 5–40)

## 2024-05-03 LAB — VITAMIN D 25 HYDROXY (VIT D DEFICIENCY, FRACTURES): Vit D, 25-Hydroxy: 24.3 ng/mL — ABNORMAL LOW (ref 30.0–100.0)

## 2024-05-03 LAB — HEMOGLOBIN A1C
Est. average glucose Bld gHb Est-mCnc: 120 mg/dL
Hgb A1c MFr Bld: 5.8 % — ABNORMAL HIGH (ref 4.8–5.6)

## 2024-05-03 LAB — HEPATITIS B SURFACE ANTIBODY,QUALITATIVE: Hep B Surface Ab, Qual: NONREACTIVE

## 2024-05-03 LAB — VITAMIN B12: Vitamin B-12: 627 pg/mL (ref 232–1245)

## 2024-05-07 ENCOUNTER — Other Ambulatory Visit: Payer: Self-pay

## 2024-05-07 DIAGNOSIS — Z860100 Personal history of colon polyps, unspecified: Secondary | ICD-10-CM

## 2024-05-07 MED ORDER — NA SULFATE-K SULFATE-MG SULF 17.5-3.13-1.6 GM/177ML PO SOLN: 354.0000 mL | Freq: Once | ORAL | 0 refills | Status: AC

## 2024-05-09 ENCOUNTER — Ambulatory Visit: Payer: Self-pay | Admitting: Family Medicine

## 2024-08-02 ENCOUNTER — Encounter: Payer: Self-pay | Admitting: Family Medicine

## 2024-08-02 ENCOUNTER — Ambulatory Visit: Admitting: Family Medicine

## 2024-08-02 DIAGNOSIS — E559 Vitamin D deficiency, unspecified: Secondary | ICD-10-CM

## 2024-08-02 DIAGNOSIS — I10 Essential (primary) hypertension: Secondary | ICD-10-CM

## 2024-08-02 NOTE — Progress Notes (Unsigned)
 Established patient visit   Patient: Karen Morrison   DOB: 1971-04-29   53 y.o. Female  MRN: [id number] Visit Date: 08/02/2024  Today's healthcare provider: LAURAINE LOISE BUOY, DO   Chief Complaint  Patient presents with   Weight Check    Patient reports she is here for a follow up on weight.  Was put on phentermine  but stopped taking it stop being effective and interrupting her sleep pattern.  Also had severe constipation while on it.  Colonoscopy- scheduled for Monday   Subjective    HPI Karen Morrison is a 53 year old female who presents for a follow-up visit regarding weight management and medication side effects.  She has been experiencing constipation, which she attributes to phentermine  use. The medication has become less effective over time, and she experienced significant constipation in the last two weeks of use. Despite adequate hydration, the symptoms persisted. She has a third bottle of phentermine  but has not used it yet.  She has lost 15 pounds recently due to increased physical activity and mindful eating. She engages in low-impact exercises, such as walking with her husband using a '[place] walk at home DVD'. She prefers a moderate approach to weight loss, aiming for a half to one pound per week, which she finds more sustainable long-term.  She is scheduled for a colonoscopy on Monday and has stopped taking most medications and changed her diet in preparation, as she was advised to do. She has had adverse reactions to vaccines in the past and is concerned about getting a flu shot before the procedure. She plans to get the flu shot at a later date and will send the record once obtained.  She has experienced flu-like symptoms after previous COVID vaccinations and is considering whether to get a booster. She has not been vaccinated against hepatitis B (or did not convert to immune status if she was), as her blood results showed she was non-reactive. She plans to get  these vaccines after her colonoscopy.  She has been taking vitamin D  but had to stop due to the upcoming colonoscopy. She plans to resume after the procedure. She experiences anxiety during medical visits, which may affect her blood pressure readings.       Medications: Outpatient Medications Prior to Visit  Medication Sig   acetaminophen  (TYLENOL ) 500 MG tablet Take 500 mg by mouth every 6 (six) hours as needed.   hydrochlorothiazide  (HYDRODIURIL ) 25 MG tablet Take 1 tablet (25 mg total) by mouth daily. TAKE 1 TABLET(25 MG) BY MOUTH EVERY MORNING   meloxicam  (MOBIC ) 7.5 MG tablet Take 1 tablet (7.5 mg total) by mouth daily.   Multiple Vitamin (MULTIVITAMIN) tablet Take 1 tablet by mouth daily.   senna (SENOKOT) 8.6 MG tablet Take 1 tablet by mouth daily as needed for constipation.   No facility-administered medications prior to visit.        Objective    BP 117/82 (BP Location: Right Arm, Cuff Size: Large)   Pulse 92   Temp 98.3 F (36.8 C) (Oral)   Ht 5' 5 (1.651 m)   Wt 288 lb (130.6 kg)   LMP 03/16/2017 (Exact Date)   SpO2 98%   BMI 47.93 kg/m     Physical Exam Vitals and nursing note reviewed.  Constitutional:      General: She is not in acute distress.    Appearance: Normal appearance.  HENT:     Head: Normocephalic and atraumatic.  Eyes:  General: No scleral icterus.    Conjunctiva/sclera: Conjunctivae normal.  Cardiovascular:     Rate and Rhythm: Normal rate.  Pulmonary:     Effort: Pulmonary effort is normal.  Neurological:     Mental Status: She is alert and oriented to person, place, and time. Mental status is at baseline.  Psychiatric:        Mood and Affect: Mood normal.        Behavior: Behavior normal.      No results found for any visits on 08/02/24.  Assessment & Plan    Morbid obesity (HCC)  Essential hypertension  Vitamin D  deficiency     Morbid obesity Ongoing management with lifestyle changes. Lost 15 pounds through  increased activity. Prefers moderate weight loss.  Previously experienced constipation on phentermine  leading to discontinuation.  Discussed alternative pharmacological options but prefers lifestyle modifications. - Continue current exercise regimen and dietary awareness. - Consider pharmacological options if lifestyle modifications are ineffective over time.  Essential hypertension Chronic, overall stable.  Diastolic blood pressure slightly elevated, possibly due to anxiety during visits. - Blood pressure improved on recheck though still very minimally elevated. - Continue hydrochlorothiazide  25 mg daily without change. - Counseled patient on lifestyle modifications.  Vitamin D  deficiency Temporarily discontinued vitamin D  supplements for colonoscopy preparation. - Resume vitamin D  supplementation post-colonoscopy.  General Health Maintenance Due for flu, pneumonia, and hepatitis B vaccines. Colonoscopy scheduled. Prefers to delay vaccinations until post-procedure due to past adverse reactions. - Return for flu shot, pneumonia vaccine, and hepatitis B vaccine post-colonoscopy. - Encourage her to send vaccination records if obtained elsewhere.    Return in about 1 month (around 09/04/2024) for weight, vaccines (flu, hep B and pneumonia).      I discussed the assessment and treatment plan with the patient  The patient was provided an opportunity to ask questions and all were answered. The patient agreed with the plan and demonstrated an understanding of the instructions.   The patient was advised to call back or seek an in-person evaluation if the symptoms worsen or if the condition fails to improve as anticipated.    LAURAINE LOISE BUOY, DO  [facility] [phone] (phone) [phone] (fax)  [facility]

## 2024-08-02 NOTE — Patient Instructions (Signed)
 Increase your intake of fresh/frozen fruits and vegetables.  The Mediterranean diet is a great reference for meal ideas.  I strongly encourage you to incorporate exercise into your daily routine (at least 30 minutes most days of the week) and monitor your dietary intake. - I encourage you to measure/weigh your foods/beverages for a few days to get a more accurate idea of what different amounts of things look like on your plate or in your glass.  - Using smaller plates/glasses also helps in that it tricks our minds into thinking we've eaten more than we have. Studies have shown that we eat more when presented with larger plates, even with the same amount of food on them.   - Plan to eat until you are no longer hungry, rather than until you're full.  Start with something simple or more enjoyable. You can plan to walk for your half hour or do something like dancing if you enjoy it.  Build up your activity over time; this will make it more enjoyable and reduce your risk of injury.  Here are some videos which offer a variety of different workout classes with different levels: [email or web address].[email or web address]  It is completely free. If a full video is too much for you to do, you can do them in bite-sized chunks (just pausing when needed and resuming later in the day).   Even if these actions don't ultimately lead to weight loss, increasing your activity will still help to reduce your insulin  resistance and will improve your cardiovascular health (making heart attack/stroke less likely as you age).

## 2024-08-05 ENCOUNTER — Ambulatory Visit: Admitting: Anesthesiology

## 2024-08-05 ENCOUNTER — Encounter: Payer: Self-pay | Admitting: Gastroenterology

## 2024-08-05 ENCOUNTER — Ambulatory Visit
Admission: RE | Admit: 2024-08-05 | Discharge: 2024-08-05 | Disposition: A | Discharge status: Home / Self Care | Attending: Gastroenterology | Admitting: Gastroenterology

## 2024-08-05 ENCOUNTER — Encounter
Admission: RE | Disposition: A | Discharge status: Home / Self Care | Payer: Self-pay | Source: Home / Self Care | Attending: Gastroenterology

## 2024-08-05 DIAGNOSIS — Z6841 Body Mass Index (BMI) 40.0 and over, adult: Secondary | ICD-10-CM | POA: Diagnosis not present

## 2024-08-05 DIAGNOSIS — E66813 Obesity, class 3: Secondary | ICD-10-CM | POA: Insufficient documentation

## 2024-08-05 DIAGNOSIS — Z860101 Personal history of adenomatous and serrated colon polyps: Secondary | ICD-10-CM | POA: Diagnosis not present

## 2024-08-05 DIAGNOSIS — K640 First degree hemorrhoids: Secondary | ICD-10-CM | POA: Insufficient documentation

## 2024-08-05 DIAGNOSIS — I10 Essential (primary) hypertension: Secondary | ICD-10-CM | POA: Insufficient documentation

## 2024-08-05 DIAGNOSIS — Z860100 Personal history of colon polyps, unspecified: Secondary | ICD-10-CM

## 2024-08-05 DIAGNOSIS — Z1211 Encounter for screening for malignant neoplasm of colon: Secondary | ICD-10-CM | POA: Insufficient documentation

## 2024-08-05 DIAGNOSIS — K5730 Diverticulosis of large intestine without perforation or abscess without bleeding: Secondary | ICD-10-CM | POA: Insufficient documentation

## 2024-08-05 HISTORY — PX: COLONOSCOPY: SHX5424

## 2024-08-05 SURGERY — COLONOSCOPY
Anesthesia: General

## 2024-08-05 MED ORDER — SODIUM CHLORIDE 0.9 % IV SOLN
INTRAVENOUS | Status: DC
  Administered 2024-08-05: 20 mL/h via INTRAVENOUS

## 2024-08-05 MED ORDER — PROPOFOL 1000 MG/100ML IV EMUL
INTRAVENOUS | Status: AC
  Filled 2024-08-05: qty 100

## 2024-08-05 MED ORDER — LIDOCAINE HCL (PF) 2 % IJ SOLN
INTRAMUSCULAR | Status: AC
  Filled 2024-08-05: qty 5

## 2024-08-05 MED ORDER — LIDOCAINE HCL (CARDIAC) PF 100 MG/5ML IV SOSY
PREFILLED_SYRINGE | INTRAVENOUS | Status: DC | PRN
  Administered 2024-08-05: 100 mg via INTRAVENOUS

## 2024-08-05 MED ORDER — PROPOFOL 500 MG/50ML IV EMUL
INTRAVENOUS | Status: DC | PRN
  Administered 2024-08-05: 50 mg via INTRAVENOUS
  Administered 2024-08-05: 130 ug/kg/min via INTRAVENOUS

## 2024-08-05 NOTE — H&P (Signed)
 Rogelia Copping, MD [facility] [address] [place], [place] [address] Phone:[phone] Fax : [phone]  Primary Care Physician:  Donzella Lauraine SAILOR, DO Primary Gastroenterologist:  Dr. Copping  Pre-Procedure History & Physical: HPI:  Karen Morrison is a 53 y.o. female is here for an colonoscopy.   Past Medical History:  Diagnosis Date   Allergic rhinitis    Anemia    during pregnancy only   Arthritis    joints   BMI 45.0-49.9, adult (HCC) 01/30/2017   Family history of adverse reaction to anesthesia    dad-n/v   Hypercholesteremia    Hypertension    controlled on medicine   Motion sickness    Obesity    PONV (postoperative nausea and vomiting)    scopalamine patch is the only thing that works for her   Pre-diabetes    last a1c on 04-13-21 was 5.7   Wears partial dentures    lower    Past Surgical History:  Procedure Laterality Date   CESAREAN SECTION  1994   CHOLECYSTECTOMY  1999   COLONOSCOPY WITH PROPOFOL  N/A 05/23/2019   Procedure: COLONOSCOPY WITH PROPOFOL ;  Surgeon: Therisa Bi, MD;  Location: [facility];  Service: Endoscopy;  Laterality: N/A;   CYSTOSCOPY  04/04/2017   Procedure: CYSTOSCOPY;  Surgeon: Arloa Lamar SQUIBB, MD;  Location: [facility];  Service: Gynecology;;   KNEE ARTHROPLASTY Right 07/26/2021   Procedure: COMPUTER ASSISTED TOTAL KNEE ARTHROPLASTY;  Surgeon: Mardee Lynwood SQUIBB, MD;  Location: [facility];  Service: Orthopedics;  Laterality: Right;   POLYPECTOMY  05/23/2019   Procedure: POLYPECTOMY;  Surgeon: Therisa Bi, MD;  Location: [facility];  Service: Endoscopy;;   REPLACEMENT TOTAL KNEE Left 01/02/2013   TOTAL LAPAROSCOPIC HYSTERECTOMY WITH SALPINGECTOMY Bilateral 04/04/2017   Procedure: HYSTERECTOMY TOTAL LAPAROSCOPIC WITH SALPINGECTOMY;  Surgeon: Arloa Lamar SQUIBB, MD;  Location: [facility];  Service: Gynecology;  Laterality: Bilateral;   VAGINAL BIRTH AFTER CESAREAN SECTION      Prior to Admission medications   Medication Sig  Start Date End Date Taking? Authorizing Provider  acetaminophen  (TYLENOL ) 500 MG tablet Take 500 mg by mouth every 6 (six) hours as needed.   Yes [provider]  hydrochlorothiazide  (HYDRODIURIL ) 25 MG tablet Take 1 tablet (25 mg total) by mouth daily. TAKE 1 TABLET(25 MG) BY MOUTH EVERY MORNING 05/02/24  Yes Pardue, Lauraine SAILOR, DO  meloxicam  (MOBIC ) 7.5 MG tablet Take 1 tablet (7.5 mg total) by mouth daily. 05/02/24  Yes Pardue, Lauraine SAILOR, DO  Multiple Vitamin (MULTIVITAMIN) tablet Take 1 tablet by mouth daily.   Yes [provider]  senna (SENOKOT) 8.6 MG tablet Take 1 tablet by mouth daily as needed for constipation.   Yes [provider]    Allergies as of 05/07/2024 - Review Complete 05/02/2024  Allergen Reaction Noted   Hydrocodone-acetaminophen  Nausea And Vomiting 11/12/2015   Amoxicillin Hives, Itching, and Rash 11/12/2015   Apple cider vinegar Rash 03/09/2018   Azithromycin Hives, Itching, and Rash 11/12/2015   Doxycycline  Rash 03/09/2018   Penicillins Hives, Itching, and Rash 11/12/2015    Family History  Problem Relation Age of Onset   Diabetes Mother        Type 2   Breast cancer Paternal Aunt        3s?   Alzheimer's disease Maternal Grandmother    Heart attack Maternal Grandfather    Emphysema Paternal Grandfather    Healthy Father    Healthy Sister    Healthy Daughter  Healthy Son    Healthy Sister     Social History   Socioeconomic History   Marital status: Married    Spouse name: Ronnie   Number of children: 2   Years of education: HS   Highest education level: Not on file  Occupational History   Not on file  Tobacco Use   Smoking status: Never   Smokeless tobacco: Never  Vaping Use   Vaping status: Never Used  Substance and Sexual Activity   Alcohol use: Yes    Alcohol/week: 2.0 standard drinks of alcohol    Types: 2 Glasses of wine per week   Drug use: No   Sexual activity: Yes    Birth control/protection: Pill, None   Other Topics Concern   Not on file  Social History Narrative   Not on file   Social Drivers of Health   Financial Resource Strain: Low Risk  (06/12/2024)   Received from [facility]   Overall Financial Resource Strain (CARDIA)    Difficulty of Paying Living Expenses: Not hard at all  Food Insecurity: No Food Insecurity (06/12/2024)   Received from [facility]   Hunger Vital Sign    Within the past 12 months, you worried that your food would run out before you got the money to buy more.: Never true    Within the past 12 months, the food you bought just didn't last and you didn't have money to get more.: Never true  Transportation Needs: No Transportation Needs (06/12/2024)   Received from [facility] - Transportation    In the past 12 months, has lack of transportation kept you from medical appointments or from getting medications?: No    Lack of Transportation (Non-Medical): No  Physical Activity: Insufficiently Active (05/02/2024)   Exercise Vital Sign    Days of Exercise per Week: 1 day    Minutes of Exercise per Session: 30 min  Stress: No Stress Concern Present (05/02/2024)   [employer] of Occupational Health - Occupational Stress Questionnaire    Feeling of Stress: Only a little  Social Connections: Moderately Isolated (05/02/2024)   Social Connection and Isolation Panel    Frequency of Communication with Friends and Family: Once a week    Frequency of Social Gatherings with Friends and Family: Twice a week    Attends Religious Services: Never    Database administrator or Organizations: No    Attends Banker Meetings: Never    Marital Status: Married  Catering manager Violence: Not At Risk (05/02/2024)   Humiliation, Afraid, Rape, and Kick questionnaire    Fear of Current or Ex-Partner: No    Emotionally Abused: No    Physically Abused: No    Sexually Abused: No    Review of Systems: See  HPI, otherwise negative ROS  Physical Exam: BP (!) 133/111   Pulse (!) 106   Temp (!) 96.9 F (36.1 C) (Temporal)   Resp 20   Ht 5' 5 (1.651 m)   Wt 128.6 kg   LMP 03/16/2017 (Exact Date)   SpO2 96%   BMI 47.19 kg/m  General:   Alert,  pleasant and cooperative in NAD Head:  Normocephalic and atraumatic. Neck:  Supple; no masses or thyromegaly. Lungs:  Clear throughout to auscultation.    Heart:  Regular rate and rhythm. Abdomen:  Soft, nontender and nondistended. Normal bowel sounds, without guarding, and without rebound.   Neurologic:  Alert and  oriented x4;  grossly normal neurologically.  Impression/Plan: Karen Morrison is here for an colonoscopy to be performed for a history of adenomatous polyps on 2020   Risks, benefits, limitations, and alternatives regarding  colonoscopy have been reviewed with the patient.  Questions have been answered.  All parties agreeable.   Rogelia Copping, MD  08/05/2024, 7:42 AM

## 2024-08-05 NOTE — Op Note (Signed)
 [facility] Patient Name: Karen Morrison Procedure Date: 08/05/2024 7:13 AM MRN: [id number] Account #: [id number] Date of Birth: 1971/01/21 Admit Type: Outpatient Age: 53 Room: [facility] ENDO ROOM 4 Gender: Female Note Status: Finalized Instrument Name: Colon Scope [id number] Procedure:             Colonoscopy Indications:           High risk colon cancer surveillance: Personal history                         of colonic polyps Providers:             Rogelia Copping MD, MD Referring MD:          Lauraine GEANNIE Buoy (Referring MD) Medicines:             Propofol  per Anesthesia Complications:         No immediate complications. Procedure:             Pre-Anesthesia Assessment:                        - Prior to the procedure, a History and Physical was                         performed, and patient medications and allergies were                         reviewed. The patient's tolerance of previous                         anesthesia was also reviewed. The risks and benefits                         of the procedure and the sedation options and risks                         were discussed with the patient. All questions were                         answered, and informed consent was obtained. Prior                         Anticoagulants: The patient has taken no anticoagulant                         or antiplatelet agents. ASA Grade Assessment: II - A                         patient with mild systemic disease. After reviewing                         the risks and benefits, the patient was deemed in                         satisfactory condition to undergo the procedure.                        After obtaining informed consent, the colonoscope was  passed under direct vision. Throughout the procedure,                         the patient's blood pressure, pulse, and oxygen                         saturations were monitored continuously. The                          Colonoscope was introduced through the anus and                         advanced to the the cecum, identified by appendiceal                         orifice and ileocecal valve. The colonoscopy was                         performed without difficulty. The patient tolerated                         the procedure well. The quality of the bowel                         preparation was excellent. Findings:      The perianal and digital rectal examinations were normal.      A few small-mouthed diverticula were found in the sigmoid colon.      Non-bleeding internal hemorrhoids were found during retroflexion. The       hemorrhoids were Grade I (internal hemorrhoids that do not prolapse). Impression:            - Diverticulosis in the sigmoid colon.                        - Non-bleeding internal hemorrhoids.                        - No specimens collected. Recommendation:        - Discharge patient to home.                        - Resume previous diet.                        - Continue present medications.                        - Repeat colonoscopy in 7 years for surveillance. Procedure Code(s):     --- Professional ---                        [phone], Colonoscopy, flexible; diagnostic, including                         collection of specimen(s) by brushing or washing, when                         performed (separate procedure) Diagnosis Code(s):     --- Professional ---  Z86.010, Personal history of colonic polyps CPT copyright 2022 American Medical Association. All rights reserved. The codes documented in this report are preliminary and upon coder review may  be revised to meet current compliance requirements. Rogelia Copping MD, MD 08/05/2024 8:10:45 AM This report has been signed electronically. Number of Addenda: 0 Note Initiated On: 08/05/2024 7:13 AM Scope Withdrawal Time: 0 hours 6 minutes 55 seconds  Total Procedure Duration: 0 hours 11 minutes 22 seconds   Estimated Blood Loss:  Estimated blood loss: none.      [facility]

## 2024-08-05 NOTE — Anesthesia Preprocedure Evaluation (Addendum)
 Anesthesia Evaluation  Patient identified by MRN, date of birth, ID band Patient awake    Reviewed: Allergy & Precautions, H&P , NPO status , Patient's Chart, lab work & pertinent test results  History of Anesthesia Complications (+) PONV and history of anesthetic complications  Airway Mallampati: IV  TM Distance: >3 FB Neck ROM: full    Dental  (+) Partial Lower   Pulmonary neg pulmonary ROS   Pulmonary exam normal        Cardiovascular hypertension, Normal cardiovascular exam     Neuro/Psych negative neurological ROS  negative psych ROS   GI/Hepatic negative GI ROS, Neg liver ROS,,,  Endo/Other    Class 3 obesity  Renal/GU negative Renal ROS  negative genitourinary   Musculoskeletal   Abdominal  (+) + obese  Peds  Hematology negative hematology ROS (+)   Anesthesia Other Findings Past Medical History: No date: Allergic rhinitis No date: Anemia     Comment:  during pregnancy only No date: Arthritis     Comment:  joints 01/30/2017: BMI 45.0-49.9, adult (HCC) No date: Family history of adverse reaction to anesthesia     Comment:  dad-n/v No date: Hypercholesteremia No date: Hypertension     Comment:  controlled on medicine No date: Motion sickness No date: Obesity No date: PONV (postoperative nausea and vomiting)     Comment:  scopalamine patch is the only thing that works for her No date: Pre-diabetes     Comment:  last a1c on 04-13-21 was 5.7 No date: Wears partial dentures     Comment:  lower  Past Surgical History: 1994: CESAREAN SECTION 1999: CHOLECYSTECTOMY 05/23/2019: COLONOSCOPY WITH PROPOFOL ; N/A     Comment:  Procedure: COLONOSCOPY WITH PROPOFOL ;  Surgeon: Therisa Bi, MD;  Location: [facility];  Service:               Endoscopy;  Laterality: N/A; 04/04/2017: CYSTOSCOPY     Comment:  Procedure: CYSTOSCOPY;  Surgeon: Arloa Lamar SQUIBB, MD;                Location:  [facility] OR[facility];  Service: Gynecology;; 07/26/2021: KNEE ARTHROPLASTY; Right     Comment:  Procedure: COMPUTER ASSISTED TOTAL KNEE ARTHROPLASTY;                Surgeon: Mardee Lynwood SQUIBB, MD;  Location: [facility];                Service: Orthopedics;  Laterality: Right; 05/23/2019: POLYPECTOMY     Comment:  Procedure: POLYPECTOMY;  Surgeon: Therisa Bi, MD;                Location: [facility];  Service: Endoscopy;; 01/02/2013: REPLACEMENT TOTAL KNEE; Left 04/04/2017: TOTAL LAPAROSCOPIC HYSTERECTOMY WITH SALPINGECTOMY;  Bilateral     Comment:  Procedure: HYSTERECTOMY TOTAL LAPAROSCOPIC WITH               SALPINGECTOMY;  Surgeon: Arloa Lamar SQUIBB, MD;  Location:              [facility] OR[facility];  Service: Gynecology;  Laterality: Bilateral; No date: VAGINAL BIRTH AFTER CESAREAN SECTION     Reproductive/Obstetrics negative OB ROS                              Anesthesia Physical Anesthesia Plan  ASA: 3  Anesthesia Plan: General   Post-op  Pain Management: Minimal or no pain anticipated   Induction: Intravenous  PONV Risk Score and Plan: Propofol  infusion and TIVA  Airway Management Planned: Natural Airway  Additional Equipment:   Intra-op Plan:   Post-operative Plan:   Informed Consent: I have reviewed the patients History and Physical, chart, labs and discussed the procedure including the risks, benefits and alternatives for the proposed anesthesia with the patient or authorized representative who has indicated his/her understanding and acceptance.     Dental Advisory Given  Plan Discussed with: CRNA and Surgeon  Anesthesia Plan Comments:          Anesthesia Quick Evaluation

## 2024-08-05 NOTE — Transfer of Care (Signed)
 Immediate Anesthesia Transfer of Care Note  Patient: Karen Morrison Seats  Procedure(s) Performed: COLONOSCOPY  Patient Location: PACU  Anesthesia Type:MAC  Level of Consciousness: awake, alert , and oriented  Airway & Oxygen Therapy: Patient Spontanous Breathing  Post-op Assessment: Report given to RN and Post -op Vital signs reviewed and stable  Post vital signs: stable  Last Vitals:  Vitals Value Taken Time  BP 128/66 08/05/24 08:18  Temp 36.1 C 08/05/24 08:18  Pulse 102 08/05/24 08:18  Resp 16 08/05/24 08:18  SpO2 99 % 08/05/24 08:18    Last Pain:  Vitals:   08/05/24 0818  TempSrc: Tympanic  PainSc: Asleep         Complications: No notable events documented.

## 2024-08-05 NOTE — Anesthesia Postprocedure Evaluation (Signed)
 Anesthesia Post Note  Patient: Karen Morrison  Procedure(s) Performed: COLONOSCOPY  Patient location during evaluation: Endoscopy Anesthesia Type: General Level of consciousness: awake and alert Pain management: pain level controlled Vital Signs Assessment: post-procedure vital signs reviewed and stable Respiratory status: spontaneous breathing, nonlabored ventilation and respiratory function stable Cardiovascular status: blood pressure returned to baseline and stable Postop Assessment: no apparent nausea or vomiting Anesthetic complications: no   No notable events documented.   Last Vitals:  Vitals:   08/05/24 0828 08/05/24 0838  BP: 131/78 127/83  Pulse: 89 88  Resp: 20 15  Temp:    SpO2: 100% 100%    Last Pain:  Vitals:   08/05/24 0838  TempSrc:   PainSc: 0-No pain                 Camellia Merilee Louder

## 2024-09-23 ENCOUNTER — Encounter: Payer: Self-pay | Admitting: Family Medicine

## 2024-09-23 ENCOUNTER — Ambulatory Visit: Admitting: Family Medicine

## 2024-09-23 VITALS — BP 146/100 | HR 111 | Temp 98.8°F | Ht 65.0 in | Wt 287.7 lb

## 2024-09-23 DIAGNOSIS — J0140 Acute pansinusitis, unspecified: Secondary | ICD-10-CM | POA: Diagnosis not present

## 2024-09-23 DIAGNOSIS — R051 Acute cough: Secondary | ICD-10-CM | POA: Diagnosis not present

## 2024-09-23 LAB — POCT INFLUENZA A/B
Influenza A, POC: NEGATIVE
Influenza B, POC: NEGATIVE

## 2024-09-23 LAB — POC COVID19 BINAXNOW: SARS Coronavirus 2 Ag: NEGATIVE

## 2024-09-23 MED ORDER — BENZONATATE 100 MG PO CAPS: 100.0000 mg | ORAL_CAPSULE | Freq: Two times a day (BID) | ORAL | 0 refills | Status: AC | PRN

## 2024-09-23 MED ORDER — CEFDINIR 300 MG PO CAPS: 300.0000 mg | ORAL_CAPSULE | Freq: Two times a day (BID) | ORAL | 0 refills | Status: AC

## 2024-09-23 NOTE — Patient Instructions (Signed)
 To keep you healthy, please keep in mind the following health maintenance items that you are due for:   There are no preventive care reminders to display for this patient.   Best Wishes,   Dr. Lang

## 2024-09-23 NOTE — Progress Notes (Signed)
 ACUTE VISIT   Patient: Karen Morrison   DOB: 13-Nov-1970   53 y.o. Female  MRN: [id number]   PCP: Donzella Lauraine SAILOR, DO  Chief Complaint  Patient presents with   URI    Patient is present with congestion, cough, eye itchiness/ watering x 7 days.  Cough is productive, no wheezing or SOB.  Denies fever, some chills, fatigue    Eye Drainage    Eye drainage on Saturday and this morning, patient describes crust like discharge around both eyes, some redness and itchiness    Subjective    HPI HPI     URI    Additional comments: Patient is present with congestion, cough, eye itchiness/ watering x 7 days.  Cough is productive, no wheezing or SOB.  Denies fever, some chills, fatigue         Eye Drainage    Additional comments: Eye drainage on Saturday and this morning, patient describes crust like discharge around both eyes, some redness and itchiness       Last edited by Cherry Chiquita HERO, CMA on 09/23/2024  8:52 AM.       Discussed the use of AI scribe software for clinical note transcription with the patient, who gave verbal consent to proceed.  History of Present Illness Karen Morrison is a 53 year old female with hypertension who presents with cough, congestion, and eye symptoms.  She has experienced a productive cough and nasal congestion for the past seven days, accompanied by blood-tinged nasal discharge and a sore throat in the morning. No wheezing, shortness of breath, fevers, chills, or fatigue.  She reports itchy and watery eyes with drainage that began two days ago. The discharge is described as 'snot-like,' and her eyes are crusted over in the morning. She has not used any eye drops for these symptoms.  She has been using over-the-counter medications, specifically Coricidin for high blood pressure, to manage her symptoms. Her blood pressure was elevated at 146/100 mmHg. She is currently on blood pressure medication and meloxicam , but not on any sinus  medication.  She has a history of allergies to hydrocodone, amoxicillin, azithromycin, doxycycline , and penicillin. She works from home since 2020, which has reduced her seasonal allergy symptoms. However, she occasionally experiences scratchy eyes and mild allergies with weather changes. She recalls attending a family Thanksgiving lunch where four family members, including herself, developed similar symptoms, though she is the only one with eye crusting.     Medications: Outpatient Medications Prior to Visit  Medication Sig   acetaminophen  (TYLENOL ) 500 MG tablet Take 500 mg by mouth every 6 (six) hours as needed.   hydrochlorothiazide  (HYDRODIURIL ) 25 MG tablet Take 1 tablet (25 mg total) by mouth daily. TAKE 1 TABLET(25 MG) BY MOUTH EVERY MORNING   meloxicam  (MOBIC ) 7.5 MG tablet Take 1 tablet (7.5 mg total) by mouth daily.   Multiple Vitamin (MULTIVITAMIN) tablet Take 1 tablet by mouth daily.   senna (SENOKOT) 8.6 MG tablet Take 1 tablet by mouth daily as needed for constipation.   No facility-administered medications prior to visit.    Last CBC Lab Results  Component Value Date   WBC 8.1 05/02/2023   HGB 14.4 05/02/2023   HCT 43.8 05/02/2023   MCV 85 05/02/2023   MCH 28.1 05/02/2023   RDW 13.4 05/02/2023   PLT 342 05/02/2023   Last metabolic panel Lab Results  Component Value Date   GLUCOSE 100 (H) 05/02/2024   NA 140 05/02/2024  K 4.3 05/02/2024   CL 101 05/02/2024   CO2 22 05/02/2024   BUN 12 05/02/2024   CREATININE 0.77 05/02/2024   EGFR 91 05/02/2024   CALCIUM 9.2 05/02/2024   PROT 6.7 05/02/2024   ALBUMIN 4.2 05/02/2024   LABGLOB 2.5 05/02/2024   AGRATIO 2.5 (H) 04/27/2022   BILITOT 0.5 05/02/2024   ALKPHOS 125 (H) 05/02/2024   AST 17 05/02/2024   ALT 14 05/02/2024   ANIONGAP 8 07/15/2021   Last lipids Lab Results  Component Value Date   CHOL 215 (H) 05/02/2024   HDL 56 05/02/2024   LDLCALC 143 (H) 05/02/2024   TRIG 89 05/02/2024   CHOLHDL 3.8  05/02/2024   Last hemoglobin A1c Lab Results  Component Value Date   HGBA1C 5.8 (H) 05/02/2024        Objective    BP (!) 146/100 (BP Location: Right Arm, Patient Position: Sitting, Cuff Size: Normal)   Pulse (!) 111   Temp 98.8 F (37.1 C) (Oral)   Ht 5' 5 (1.651 m)   Wt 287 lb 11.2 oz (130.5 kg)   LMP 03/16/2017 (Exact Date)   SpO2 97%   BMI 47.88 kg/m  BP Readings from Last 3 Encounters:  09/23/24 (!) 146/100  08/05/24 127/83  08/02/24 117/82   Wt Readings from Last 3 Encounters:  09/23/24 287 lb 11.2 oz (130.5 kg)  08/05/24 283 lb 9.6 oz (128.6 kg)  08/02/24 288 lb (130.6 kg)      Physical Exam   Physical Exam Physical Exam    VITALS: BP- 146/100 GENERAL: Ill-appearing, coughing, bilateral eye yellow discharge. HEENT: Erythematous oropharynx, bilateral tympanic membrane effusions with bulging, maxillary and frontal sinus pressure.   Results for orders placed or performed in visit on 09/23/24  POCT Influenza A/B  Result Value Ref Range   Influenza A, POC Negative Negative   Influenza B, POC Negative Negative  POC COVID-19 BinaxNow  Result Value Ref Range   SARS Coronavirus 2 Ag Negative Negative    Assessment & Plan     Assessment and Plan Assessment & Plan Acute pansinusitis with conjunctivitis and cough Acute pansinusitis with associated conjunctivitis and cough, persisting for over seven days. Symptoms include productive cough, congestion, itchy and watery eyes, and eye drainage with redness and itching. Examination reveals bilateral eye yellow discharge, erythematous oropharynx, bilateral tympanic membrane effusions, and bulging tympanic membranes. Maxillary and frontal sinus pressure is present. Negative point of care flu and COVID tests. Allergic to hydrocodone, amoxicillin, azithromycin, doxycycline , and penicillin. Cefdinir  is chosen due to previous tolerance and current symptoms. - Prescribed cefdinir  300 mg twice daily for 7 days. - Advised  follow-up with PCP in one month for elevated blood pressure.  Hypertension Blood pressure is elevated at 146/100 mmHg. She is on blood pressure medication and has experienced elevated blood pressure with over-the-counter medications in the past. - Advised follow-up with PCP in one month for blood pressure management.      Return in about 1 month (around 10/24/2024) for HTN.        Rockie Agent, MD  [facility] [phone] (phone) [phone] (fax)  [facility]
